# Patient Record
Sex: Female | Born: 1965 | Race: White | Hispanic: No | Smoking: Current every day smoker
Health system: Southern US, Community
[De-identification: ages and names within clinical notes are randomized; demographics above are authoritative.]

## PROBLEM LIST (undated history)

## (undated) DIAGNOSIS — F410 Panic disorder [episodic paroxysmal anxiety] without agoraphobia: Secondary | ICD-10-CM

## (undated) DIAGNOSIS — F509 Eating disorder, unspecified: Secondary | ICD-10-CM

## (undated) DIAGNOSIS — F429 Obsessive-compulsive disorder, unspecified: Secondary | ICD-10-CM

## (undated) DIAGNOSIS — M1990 Unspecified osteoarthritis, unspecified site: Secondary | ICD-10-CM

## (undated) DIAGNOSIS — F329 Major depressive disorder, single episode, unspecified: Secondary | ICD-10-CM

## (undated) DIAGNOSIS — F319 Bipolar disorder, unspecified: Secondary | ICD-10-CM

## (undated) DIAGNOSIS — R87629 Unspecified abnormal cytological findings in specimens from vagina: Secondary | ICD-10-CM

## (undated) DIAGNOSIS — K219 Gastro-esophageal reflux disease without esophagitis: Secondary | ICD-10-CM

## (undated) DIAGNOSIS — M7500 Adhesive capsulitis of unspecified shoulder: Secondary | ICD-10-CM

## (undated) DIAGNOSIS — G709 Myoneural disorder, unspecified: Secondary | ICD-10-CM

## (undated) DIAGNOSIS — D72829 Elevated white blood cell count, unspecified: Secondary | ICD-10-CM

## (undated) DIAGNOSIS — F419 Anxiety disorder, unspecified: Secondary | ICD-10-CM

## (undated) DIAGNOSIS — F32A Depression, unspecified: Secondary | ICD-10-CM

## (undated) DIAGNOSIS — M549 Dorsalgia, unspecified: Secondary | ICD-10-CM

## (undated) DIAGNOSIS — J381 Polyp of vocal cord and larynx: Secondary | ICD-10-CM

## (undated) HISTORY — PX: DIAGNOSTIC LAPAROSCOPY: SUR761

## (undated) HISTORY — DX: Elevated white blood cell count, unspecified: D72.829

## (undated) HISTORY — DX: Eating disorder, unspecified: F50.9

## (undated) HISTORY — PX: OTHER SURGICAL HISTORY: SHX169

## (undated) HISTORY — PX: TONSILLECTOMY: SUR1361

## (undated) HISTORY — DX: Gastro-esophageal reflux disease without esophagitis: K21.9

## (undated) HISTORY — DX: Bipolar disorder, unspecified: F31.9

## (undated) HISTORY — DX: Dorsalgia, unspecified: M54.9

## (undated) HISTORY — DX: Adhesive capsulitis of unspecified shoulder: M75.00

## (undated) HISTORY — DX: Obsessive-compulsive disorder, unspecified: F42.9

## (undated) HISTORY — DX: Polyp of vocal cord and larynx: J38.1

## (undated) HISTORY — DX: Myoneural disorder, unspecified: G70.9

## (undated) HISTORY — DX: Unspecified abnormal cytological findings in specimens from vagina: R87.629

## (undated) HISTORY — DX: Panic disorder (episodic paroxysmal anxiety): F41.0

---

## 2003-07-07 ENCOUNTER — Ambulatory Visit (HOSPITAL_COMMUNITY): Admission: RE | Admit: 2003-07-07 | Discharge: 2003-07-07 | Payer: Self-pay | Admitting: Obstetrics and Gynecology

## 2003-07-17 ENCOUNTER — Inpatient Hospital Stay (HOSPITAL_COMMUNITY): Admission: RE | Admit: 2003-07-17 | Discharge: 2003-07-19 | Payer: Self-pay | Admitting: Obstetrics and Gynecology

## 2011-09-24 ENCOUNTER — Other Ambulatory Visit (HOSPITAL_COMMUNITY)
Admission: RE | Admit: 2011-09-24 | Discharge: 2011-09-24 | Disposition: A | Discharge status: Home / Self Care | Payer: Medicaid Other | Source: Ambulatory Visit | Attending: Obstetrics and Gynecology | Admitting: Obstetrics and Gynecology

## 2011-09-24 DIAGNOSIS — Z01419 Encounter for gynecological examination (general) (routine) without abnormal findings: Secondary | ICD-10-CM | POA: Insufficient documentation

## 2011-10-04 HISTORY — PX: TUBAL LIGATION: SHX77

## 2011-10-20 ENCOUNTER — Encounter (HOSPITAL_COMMUNITY): Payer: Self-pay | Admitting: Pharmacy Technician

## 2011-10-21 ENCOUNTER — Other Ambulatory Visit: Payer: Self-pay | Admitting: Obstetrics and Gynecology

## 2011-10-21 ENCOUNTER — Encounter (HOSPITAL_COMMUNITY)
Admission: RE | Admit: 2011-10-21 | Discharge: 2011-10-21 | Disposition: A | Discharge status: Home / Self Care | Payer: Medicaid Other | Source: Ambulatory Visit | Attending: Obstetrics and Gynecology | Admitting: Obstetrics and Gynecology

## 2011-10-21 ENCOUNTER — Encounter (HOSPITAL_COMMUNITY): Payer: Self-pay

## 2011-10-21 HISTORY — DX: Major depressive disorder, single episode, unspecified: F32.9

## 2011-10-21 HISTORY — DX: Depression, unspecified: F32.A

## 2011-10-21 HISTORY — DX: Anxiety disorder, unspecified: F41.9

## 2011-10-21 HISTORY — DX: Unspecified osteoarthritis, unspecified site: M19.90

## 2011-10-21 LAB — CBC
MCH: 33 pg (ref 26.0–34.0)
MCV: 95.8 fL (ref 78.0–100.0)
Platelets: 266 10*3/uL (ref 150–400)
RDW: 12.1 % (ref 11.5–15.5)

## 2011-10-21 LAB — URINALYSIS, ROUTINE W REFLEX MICROSCOPIC
Bilirubin Urine: NEGATIVE
Hgb urine dipstick: NEGATIVE
Protein, ur: NEGATIVE mg/dL
Specific Gravity, Urine: <1.005 — ABNORMAL LOW (ref 1.005–1.030)
Urobilinogen, UA: 0.2 mg/dL (ref 0.0–1.0)

## 2011-10-21 LAB — URINE MICROSCOPIC-ADD ON

## 2011-10-21 NOTE — Patient Instructions (Addendum)
20 Penny Moreno  10/21/2011   Your procedure is scheduled on:  10/30/11  Report to [facility] at 06:15 AM.  Call this number if you have problems the morning of surgery: [phone]   Remember:   Do not eat food or drink:After Midnight.  Take these medicines the morning of surgery with A SIP OF WATER: Alprazolam and Fluoxetine  if needed. Also, use your Flonase inhaler and bring it with you to the hospital.   Do not wear jewelry, make-up or nail polish.  Do not wear lotions, powders, or perfumes.   Do not shave 48 hours prior to surgery. Men may shave face and neck.  Do not bring valuables to the hospital.  Contacts, dentures or bridgework may not be worn into surgery.  Leave suitcase in the car. After surgery it may be brought to your room.  For patients admitted to the hospital, checkout time is 11:00 AM the day of discharge.   Patients discharged the day of surgery will not be allowed to drive home.  Special Instructions: CHG Shower Shower 2 days before surgery and 1 day before surgery with Hibiclens.   Please read over the following fact sheets that you were given: Pain Booklet, MRSA Information, Surgical Site Infection Prevention, Anesthesia Post-op Instructions and Care and Recovery After Surgery    Laparoscopic Tubal Ligation Laparoscopic tubal ligation is a procedure that closes the fallopian tubes. Tubal ligation is also known as getting your "tubes tied." It is a brief, common and relatively simple surgical procedure. Tubal ligation is done to cause sterilization. Sterilization means you will not be able to get pregnant or have a baby. Sometimes a tubal ligation may be reversed, but this should not be considered a possibility because of failure to get pregnant and the increased risk of tubal (ectopic) pregnancy. If you want to have future pregnancies, you should not have this procedure. Use other forms of contraception. Tubal ligation can be done at any time during your menstrual  cycle. This procedure has a less than 1% failure rate. Tubal ligation does not protect against sexually transmitted disease. LET YOUR CAREGIVER KNOW ABOUT:  Allergies, especially to medicines.   All the medicines you are taking, even herbs, eyedrops, steroids, creams, and over-the-counter drugs.   The possibility of being pregnant.   Past problems with medicines.   History of blood clots or other blood problems.   Past surgeries, medical or health problems.  RISKS AND COMPLICATIONS  Your caregiver will discuss the risks with you before the procedure. Some problems that can happen after this procedure include:  Infection. A germ starts growing in one of the wounds. This can usually be treated with antibiotic medicines.   Bleeding following surgery. Your surgeon takes every precaution to keep this from happening.   Damage to other organs. If damage to other organs or excessive bleeding should occur, it may be necessary to convert the laparoscopic procedure into an open abdominal procedure. Scarring (adhesions) from past surgeries or disease may also be a cause to change this procedure to an open abdominal operation.   Anesthetic side effects.  BEFORE THE PROCEDURE  Do not take aspirin or blood thinners a week before the procedure. This can cause bleeding. Do as directed by your caregiver.   Do not eat or drink anything 6 to 8 hours before the procedure.   Let your caregiver know if you get a cold or an infection before the procedure.   Arrive at the clinic or  hospital 60 minutes before the surgery or as directed.  PROCEDURE   You may be given a mild drug (sedative) to help you relax before the procedure. Once in the operating room, you will be given a general anesthetic to make you sleep (unless you and your caregiver choose a different anesthetic).   Once you are sleeping, your surgeon makes your abdomen larger with a harmless gas (carbon dioxide). This makes your organs easier to  see and gives room to operate.   A laparoscope is then inserted into your abdomen through a small cut (incision) below the navel. A laparoscope is a thin, lighted, pencil-sized instrument. It is like a telescope. Once inserted, your caregiver can look at the organs inside your abdomen. He or she can see if there is anything abnormal.   Other small instruments also are put into the abdomen through other small openings (portals). Many surgeons attach a video camera to the laparoscope to enlarge the view. The fallopian tubes are located and either burned closed (cauterized) or a plastic clip is placed on them to close the tubes.  Sometimes, your surgeon may take tissue samples (biopsies) from other organs if he or she sees something abnormal. Biopsies can help to diagnose or confirm a disease. The samples are examined under a microscope. AFTER THE PROCEDURE   The gas is released from inside your abdomen.   Your incisions are closed with stitches (sutures). Because these incisions are small (usually less than  inch), there is usually little discomfort following the procedure.   You will rest in a recovery room until you are stable and doing well.   As long as there are no problems, you may be allowed to go home.   Someone will need to drive you home.   You will have some mild discomfort in the throat. This is from the tube placed in your throat while you were sleeping.   You may experience discomfort in the shoulder area from some trapped air between the liver and diaphragm. This will slowly go away on its own.   You will also have some mild abdominal discomfort. You will also have discomfort from the incisions where the instruments were placed into your abdomen.  HOME CARE INSTRUCTIONS   Only take over-the-counter or prescription medicines for pain, discomfort, or fever as directed by your caregiver. Do not take aspirin. It can cause bleeding.   Resume daily activities, diet, rest, driving,  and work as directed.   Showers are preferred over baths.   Resume sexual activities in 1 week or as directed.   If biopsies were taken, find out how to get your results. The results are usually given during the follow-up visit. Do not assume tests are normal if you do not hear from your caregiver. It is your responsibility to obtain your results.   Change the dressings as directed.   It is helpful to have someone with you for several hours after you go home. They can help you and be available if you have problems.  SEEK MEDICAL CARE IF:   You have increasing abdominal pain.   You develop new pain in your shoulders in the shoulder strap area that gets worse with time. Some pain is common and expected.   You feel lightheaded or faint.   You have chills or fever.   You develop bleeding or drainage from the suture sites or the vagina after surgery.   You develop chest pain.   You develop shortness of  breath.   You develop a rash.  Document Released: 04/27/2000 Document Revised: 01/08/2011 Document Reviewed: 08/10/2007 [facility] Patient Information 2012 [place], [place].   PATIENT INSTRUCTIONS POST-ANESTHESIA  IMMEDIATELY FOLLOWING SURGERY:  Do not drive or operate machinery for the first twenty four hours after surgery.  Do not make any important decisions for twenty four hours after surgery or while taking narcotic pain medications or sedatives.  If you develop intractable nausea and vomiting or a severe headache please notify your doctor immediately.  FOLLOW-UP:  Please make an appointment with your surgeon as instructed. You do not need to follow up with anesthesia unless specifically instructed to do so.  WOUND CARE INSTRUCTIONS (if applicable):  Keep a dry clean dressing on the anesthesia/puncture wound site if there is drainage.  Once the wound has quit draining you may leave it open to air.  Generally you should leave the bandage intact for twenty four hours unless there is  drainage.  If the epidural site drains for more than 36-48 hours please call the anesthesia department.  QUESTIONS?:  Please feel free to call your physician or the hospital operator if you have any questions, and they will be happy to assist you.

## 2011-10-30 ENCOUNTER — Encounter (HOSPITAL_COMMUNITY): Payer: Self-pay

## 2011-10-30 ENCOUNTER — Ambulatory Visit (HOSPITAL_COMMUNITY)
Admission: RE | Admit: 2011-10-30 | Discharge: 2011-10-30 | Disposition: A | Discharge status: Home / Self Care | Payer: Medicaid Other | Source: Ambulatory Visit | Attending: Obstetrics and Gynecology | Admitting: Obstetrics and Gynecology

## 2011-10-30 ENCOUNTER — Encounter (HOSPITAL_COMMUNITY): Payer: Self-pay | Admitting: Anesthesiology

## 2011-10-30 ENCOUNTER — Ambulatory Visit (HOSPITAL_COMMUNITY): Payer: Medicaid Other | Admitting: Anesthesiology

## 2011-10-30 ENCOUNTER — Encounter (HOSPITAL_COMMUNITY)
Admission: RE | Disposition: A | Discharge status: Home / Self Care | Payer: Self-pay | Source: Ambulatory Visit | Attending: Obstetrics and Gynecology

## 2011-10-30 DIAGNOSIS — Z302 Encounter for sterilization: Secondary | ICD-10-CM

## 2011-10-30 DIAGNOSIS — Z01812 Encounter for preprocedural laboratory examination: Secondary | ICD-10-CM | POA: Insufficient documentation

## 2011-10-30 HISTORY — PX: LAPAROSCOPIC TUBAL LIGATION: SHX1937

## 2011-10-30 SURGERY — LIGATION, FALLOPIAN TUBE, LAPAROSCOPIC
Anesthesia: General | Site: Abdomen | Laterality: Bilateral | Wound class: Clean Contaminated

## 2011-10-30 MED ORDER — FENTANYL CITRATE 0.05 MG/ML IJ SOLN
INTRAMUSCULAR | Status: DC | PRN
  Administered 2011-10-30: 50 ug via INTRAVENOUS
  Administered 2011-10-30: 100 ug via INTRAVENOUS
  Administered 2011-10-30: 50 ug via INTRAVENOUS

## 2011-10-30 MED ORDER — PROPOFOL 10 MG/ML IV BOLUS
INTRAVENOUS | Status: DC | PRN
  Administered 2011-10-30: 150 mg via INTRAVENOUS

## 2011-10-30 MED ORDER — ONDANSETRON HCL 4 MG/2ML IJ SOLN: 4.0000 mg | Freq: Once | INTRAMUSCULAR | Status: DC | PRN

## 2011-10-30 MED ORDER — MIDAZOLAM HCL 2 MG/2ML IJ SOLN
INTRAMUSCULAR | Status: AC
  Filled 2011-10-30: qty 2

## 2011-10-30 MED ORDER — ONDANSETRON HCL 4 MG/2ML IJ SOLN
4.0000 mg | Freq: Once | INTRAMUSCULAR | Status: AC
  Administered 2011-10-30: 4 mg via INTRAVENOUS

## 2011-10-30 MED ORDER — MIDAZOLAM HCL 2 MG/2ML IJ SOLN
1.0000 mg | INTRAMUSCULAR | Status: DC | PRN
  Administered 2011-10-30: 2 mg via INTRAVENOUS

## 2011-10-30 MED ORDER — 0.9 % SODIUM CHLORIDE (POUR BTL) OPTIME
TOPICAL | Status: DC | PRN
  Administered 2011-10-30: 1000 mL

## 2011-10-30 MED ORDER — GLYCOPYRROLATE 0.2 MG/ML IJ SOLN
0.2000 mg | Freq: Once | INTRAMUSCULAR | Status: AC
  Administered 2011-10-30: 0.2 mg via INTRAVENOUS

## 2011-10-30 MED ORDER — GLYCOPYRROLATE 0.2 MG/ML IJ SOLN
INTRAMUSCULAR | Status: DC | PRN
  Administered 2011-10-30: 0.4 mg via INTRAVENOUS

## 2011-10-30 MED ORDER — GLYCOPYRROLATE 0.2 MG/ML IJ SOLN
INTRAMUSCULAR | Status: AC
  Filled 2011-10-30: qty 1

## 2011-10-30 MED ORDER — HYDROCODONE-ACETAMINOPHEN 5-325 MG PO TABS: 1.0000 | ORAL_TABLET | ORAL | Status: DC | PRN

## 2011-10-30 MED ORDER — ROCURONIUM BROMIDE 100 MG/10ML IV SOLN
INTRAVENOUS | Status: DC | PRN
  Administered 2011-10-30: 30 mg via INTRAVENOUS

## 2011-10-30 MED ORDER — PROPOFOL 10 MG/ML IV EMUL
INTRAVENOUS | Status: AC
  Filled 2011-10-30: qty 20

## 2011-10-30 MED ORDER — FENTANYL CITRATE 0.05 MG/ML IJ SOLN
25.0000 ug | INTRAMUSCULAR | Status: DC | PRN
  Administered 2011-10-30 (×2): 50 ug via INTRAVENOUS

## 2011-10-30 MED ORDER — LIDOCAINE HCL (PF) 1 % IJ SOLN
INTRAMUSCULAR | Status: AC
  Filled 2011-10-30: qty 5

## 2011-10-30 MED ORDER — ONDANSETRON HCL 4 MG/2ML IJ SOLN
INTRAMUSCULAR | Status: AC
  Filled 2011-10-30: qty 2

## 2011-10-30 MED ORDER — MUPIROCIN 2 % EX OINT
TOPICAL_OINTMENT | CUTANEOUS | Status: AC
  Filled 2011-10-30: qty 22

## 2011-10-30 MED ORDER — LIDOCAINE HCL 1 % IJ SOLN
INTRAMUSCULAR | Status: DC | PRN
  Administered 2011-10-30: 50 mg via INTRADERMAL

## 2011-10-30 MED ORDER — ROCURONIUM BROMIDE 50 MG/5ML IV SOLN
INTRAVENOUS | Status: AC
  Filled 2011-10-30: qty 1

## 2011-10-30 MED ORDER — LACTATED RINGERS IV SOLN
INTRAVENOUS | Status: DC
  Administered 2011-10-30: 08:00:00 via INTRAVENOUS

## 2011-10-30 MED ORDER — BUPIVACAINE HCL (PF) 0.5 % IJ SOLN
INTRAMUSCULAR | Status: DC | PRN
  Administered 2011-10-30: 17 mL

## 2011-10-30 MED ORDER — FENTANYL CITRATE 0.05 MG/ML IJ SOLN
INTRAMUSCULAR | Status: AC
  Filled 2011-10-30: qty 2

## 2011-10-30 MED ORDER — MIDAZOLAM HCL 5 MG/5ML IJ SOLN
INTRAMUSCULAR | Status: DC | PRN
  Administered 2011-10-30: 2 mg via INTRAVENOUS

## 2011-10-30 MED ORDER — FENTANYL CITRATE 0.05 MG/ML IJ SOLN
INTRAMUSCULAR | Status: AC
  Filled 2011-10-30: qty 5

## 2011-10-30 MED ORDER — BUPIVACAINE HCL (PF) 0.5 % IJ SOLN
INTRAMUSCULAR | Status: AC
  Filled 2011-10-30: qty 30

## 2011-10-30 MED ORDER — NEOSTIGMINE METHYLSULFATE 1 MG/ML IJ SOLN
INTRAMUSCULAR | Status: AC
  Filled 2011-10-30: qty 10

## 2011-10-30 MED ORDER — NEOSTIGMINE METHYLSULFATE 1 MG/ML IJ SOLN
INTRAMUSCULAR | Status: DC | PRN
  Administered 2011-10-30: 3 mg via INTRAVENOUS

## 2011-10-30 SURGICAL SUPPLY — 35 items
BAG HAMPER (MISCELLANEOUS) ×2 IMPLANT
BLADE SURG SZ11 CARB STEEL (BLADE) ×2 IMPLANT
CATH INTERMIT 12FR 16IN MALE (CATHETERS) ×1 IMPLANT
CATH ROBINSON RED A/P 16FR (CATHETERS) ×1 IMPLANT
CLOTH BEACON ORANGE TIMEOUT ST (SAFETY) ×2 IMPLANT
COVER LIGHT HANDLE STERIS (MISCELLANEOUS) ×4 IMPLANT
DECANTER SPIKE VIAL GLASS SM (MISCELLANEOUS) ×2 IMPLANT
DRESSING COVERLET 3X1 FLEXIBLE (GAUZE/BANDAGES/DRESSINGS) ×4 IMPLANT
ELECT REM PT RETURN 9FT ADLT (ELECTROSURGICAL) ×2
ELECTRODE REM PT RTRN 9FT ADLT (ELECTROSURGICAL) ×1 IMPLANT
GLOVE ECLIPSE 9.0 STRL (GLOVE) ×2 IMPLANT
GLOVE EXAM NITRILE MD LF STRL (GLOVE) ×2 IMPLANT
GLOVE INDICATOR STER SZ 9 (GLOVE) ×2 IMPLANT
GOWN STRL REIN 3XL LVL4 (GOWN DISPOSABLE) ×2 IMPLANT
GOWN STRL REIN XL XLG (GOWN DISPOSABLE) ×2 IMPLANT
INST SET LAPROSCOPIC GYN AP (KITS) ×2 IMPLANT
KIT ROOM TURNOVER AP CYSTO (KITS) ×2 IMPLANT
MANIFOLD NEPTUNE II (INSTRUMENTS) ×2 IMPLANT
NDL INSUFFLATION 14GA 120MM (NEEDLE) IMPLANT
NEEDLE INSUFFLATION 14GA 120MM (NEEDLE) ×2 IMPLANT
NS IRRIG 1000ML POUR BTL (IV SOLUTION) ×2 IMPLANT
PACK PERI GYN (CUSTOM PROCEDURE TRAY) ×2 IMPLANT
PAD ARMBOARD 7.5X6 YLW CONV (MISCELLANEOUS) ×2 IMPLANT
RING FALLOPIAN BANDS (Ring) ×2 IMPLANT
SET BASIN LINEN APH (SET/KITS/TRAYS/PACK) ×2 IMPLANT
SOL PREP PROV IODINE SCRUB 4OZ (MISCELLANEOUS) ×2 IMPLANT
SOLUTION ANTI FOG 6CC (MISCELLANEOUS) ×2 IMPLANT
STRIP CLOSURE SKIN 1/4X3 (GAUZE/BANDAGES/DRESSINGS) ×2 IMPLANT
SUT VIC AB 4-0 PS2 27 (SUTURE) IMPLANT
SYR BULB IRRIGATION 50ML (SYRINGE) ×2 IMPLANT
SYR CONTROL 10ML LL (SYRINGE) ×2 IMPLANT
TROCAR KII 8X100ML NONTHREADED (TROCAR) ×2 IMPLANT
TROCAR Z-THREAD FIOS 5X100MM (TROCAR) ×2 IMPLANT
TUBING INSUFFLATION HIGH FLOW (TUBING) ×2 IMPLANT
WARMER LAPAROSCOPE (MISCELLANEOUS) ×2 IMPLANT

## 2011-10-30 NOTE — Anesthesia Procedure Notes (Signed)
Procedure Name: Intubation Date/Time: 10/30/2011 7:56 AM Performed by: Despina Hidden Pre-anesthesia Checklist: Emergency Drugs available, Suction available, Patient identified and Patient being monitored Patient Re-evaluated:Patient Re-evaluated prior to inductionOxygen Delivery Method: Circle system utilized Preoxygenation: Pre-oxygenation with 100% oxygen Intubation Type: IV induction and Cricoid Pressure applied Ventilation: Mask ventilation without difficulty Laryngoscope Size: Mac and 3 Grade View: Grade II Tube type: Oral Tube size: 7.0 mm Number of attempts: 1 Airway Equipment and Method: Stylet Placement Confirmation: ETT inserted through vocal cords under direct vision,  breath sounds checked- equal and bilateral and positive ETCO2 Secured at: 22 cm Tube secured with: Tape Dental Injury: Teeth and Oropharynx as per pre-operative assessment  Difficulty Due To: Difficult Airway- due to anterior larynx Future Recommendations: Recommend- induction with short-acting agent, and alternative techniques readily available

## 2011-10-30 NOTE — Interval H&P Note (Signed)
History and Physical Interval Note:  10/30/2011 7:41 AM  Penny Moreno  has presented today for surgery, with the diagnosis of sterilization request  The various methods of treatment have been discussed with the patient and family. After consideration of risks, benefits and other options for treatment, the patient has consented to  Procedure(s) (LRB) with comments: LAPAROSCOPIC TUBAL LIGATION (Bilateral) - Laparoscopic Bilateral tubal ligation with falope rings as a surgical intervention .  The patient's history has been reviewed, patient examined, no change in status, stable for surgery.  I have reviewed the patient's chart and labs.  Questions were answered to the patient's satisfaction.   Specifically, after discussions held in the office, the patient is comfortable following the pigmented area clinically at office visits, and plans to perform diagnostic biopsy are cancelled.  Penny Moreno

## 2011-10-30 NOTE — Brief Op Note (Signed)
10/30/2011  8:37 AM  PATIENT:  Penny Moreno  46 y.o. female  PRE-OPERATIVE DIAGNOSIS:  sterilization request POST-OPERATIVE DIAGNOSIS:  sterilization request  PROCEDURE:  Procedure(s) (LRB) with comments: LAPAROSCOPIC TUBAL LIGATION (Bilateral) - Laparoscopic Bilateral tubal ligation with falope rings SURGEON:  Surgeon(s) and Role:    * [name], MD - Primary ASSISTANTS: Blackwell, CST   ANESTHESIA:   general, local EBL:  Total I/O In: 700 [I.V.:700] Out: -  DRAINS: none   LOCAL MEDICATIONS USED:  MARCAINE    and Amount: 10 ml  SPECIMEN:  No Specimen DISPOSITION OF SPECIMEN:  N/A COUNTS:  YES  DICTATION: .Dragon Dictation Patient was taken to the operating room prepped and draped after timeout conducted, and procedure confirmed by surgical team. An infraumbilical vertical 1 cm skin incision was made as well as a transverse suprapubic incision of similar length. Water droplet technique was used through Veress needle to achieve pneumoperitoneum. The abdomen was insufflated with 3 L CO2 and the 5 mm laparoscopic trocar introduced through the umbilicus and no evidence of trauma associated with peritoneal entry. Suprapubic trocar was placed under direct visualization and attention directed to the left fallopian tube which was identified elevated and Falope ring applied to the midportion of the tube with Marcaine injection in the incarcerated knuckle of tube and beneath it in the mesosalpinx. The opposite tube was treated similarly, followed by placement of 240 cc of saline in the abdomen to assist with deflation of the abdomen. Removal of CO2 was followed by removal laparoscopic trochars, subcuticular 4-0 Vicryl closure the skin incisions, Steri-Strip placement, and Band-Aids applied. Sponge and needle counts were correct condition to recovery room good.  PLAN OF CARE: Discharge to home after PACU  PATIENT DISPOSITION:  PACU - hemodynamically stable.   Delay start of  Pharmacological VTE agent (>24hrs) due to surgical blood loss or risk of bleeding: n/a

## 2011-10-30 NOTE — Addendum Note (Signed)
Addendum  created 10/30/11 1327 by Despina Hidden, CRNA   Modules edited:Charges VN

## 2011-10-30 NOTE — Op Note (Signed)
See operative details included in brief operative note 

## 2011-10-30 NOTE — Transfer of Care (Signed)
Immediate Anesthesia Transfer of Care Note  Patient: Penny Moreno  Procedure(s) Performed: Procedure(s) (LRB) with comments: LAPAROSCOPIC TUBAL LIGATION (Bilateral) - Laparoscopic Bilateral tubal ligation with falope rings  Patient Location: PACU  Anesthesia Type: General  Level of Consciousness: awake, alert , oriented and patient cooperative  Airway & Oxygen Therapy: Patient Spontanous Breathing and Patient connected to face mask oxygen  Post-op Assessment: Report given to PACU RN, Post -op Vital signs reviewed and stable and Patient moving all extremities  Post vital signs: Reviewed and stable  Complications: No apparent anesthesia complications

## 2011-10-30 NOTE — Anesthesia Postprocedure Evaluation (Signed)
  Anesthesia Post-op Note  Patient: Penny Moreno  Procedure(s) Performed: Procedure(s) (LRB) with comments: LAPAROSCOPIC TUBAL LIGATION (Bilateral) - Laparoscopic Bilateral tubal ligation with falope rings  Patient Location: PACU  Anesthesia Type: General  Level of Consciousness: awake, alert , oriented and patient cooperative  Airway and Oxygen Therapy: Patient Spontanous Breathing  Post-op Pain: none  Post-op Assessment: Post-op Vital signs reviewed, Patient's Cardiovascular Status Stable, Respiratory Function Stable, Patent Airway, No signs of Nausea or vomiting and Pain level controlled  Post-op Vital Signs: Reviewed and stable  Complications: No apparent anesthesia complications

## 2011-10-30 NOTE — Anesthesia Preprocedure Evaluation (Signed)
Anesthesia Evaluation  Patient identified by MRN, date of birth, ID band Patient awake    Reviewed: Allergy & Precautions, H&P , NPO status , Patient's Chart, lab work & pertinent test results  History of Anesthesia Complications Negative for: history of anesthetic complications  Airway Mallampati: II TM Distance: >3 FB     Dental  (+) Teeth Intact and Poor Dentition   Pulmonary Current Smoker, PE: am cough. + rhonchi (clears w/ cough)         Cardiovascular negative cardio ROS  Rhythm:Regular Rate:Normal     Neuro/Psych PSYCHIATRIC DISORDERS Anxiety Depression    GI/Hepatic   Endo/Other    Renal/GU      Musculoskeletal   Abdominal   Peds  Hematology   Anesthesia Other Findings   Reproductive/Obstetrics                           Anesthesia Physical Anesthesia Plan  ASA: II  Anesthesia Plan: General   Post-op Pain Management:    Induction: Intravenous  Airway Management Planned: Oral ETT  Additional Equipment:   Intra-op Plan:   Post-operative Plan: Extubation in OR  Informed Consent: I have reviewed the patients History and Physical, chart, labs and discussed the procedure including the risks, benefits and alternatives for the proposed anesthesia with the patient or authorized representative who has indicated his/her understanding and acceptance.     Plan Discussed with:   Anesthesia Plan Comments:         Anesthesia Quick Evaluation

## 2011-10-30 NOTE — H&P (Signed)
Penny Moreno is an 46 y.o. female. She desires permanent sterilization and has a laparoscopic tubal sterilization by Falope-Rings explained. Additionally she has some small pigmented areas on the perineal posterior fourchette that cause her concern. Diagnostic and excisional biopsy will be added to procedure.. risk of tubal sterilization including a 1 in 100 failure rate have included patient  Pertinent Gynecological History: Menses: flow is moderate Bleeding: Regular until June slight irregularity since then Contraception: abstinence DES exposure: unknown Blood transfusions: none Sexually transmitted diseases: no past history Previous GYN Procedures: None  Last mammogram: normal Date:  Last pap: normal Date: 09/25/2011 OB History: G5, P2   Menstrual History: Menarche age: [age] Patient's last menstrual period was 09/03/2011.    Past Medical History  Diagnosis Date  . Anxiety   . Depression   . Arthritis     Past Surgical History  Procedure Date  . Diagnostic laparoscopy     [place]  . Tonsillectomy     History reviewed. No pertinent family history.  Social History:  reports that she has been smoking Cigarettes.  She has a 25 pack-year smoking history. She does not have any smokeless tobacco history on file. She reports that she drinks alcohol. She reports that she uses illicit drugs (Marijuana).  Allergies:  Allergies  Allergen Reactions  . Latex     Irritates skin   . Sulfa Antibiotics Nausea And Vomiting    Prescriptions prior to admission  Medication Sig Dispense Refill  . ALPRAZolam (XANAX) 0.5 MG tablet Take 0.5 mg by mouth daily as needed. For anxiety      . cetirizine (ZYRTEC) 10 MG tablet Take 10 mg by mouth daily.      . cyclobenzaprine (FLEXERIL) 10 MG tablet Take 10 mg by mouth daily as needed. For muscle pain      . FLUoxetine (PROZAC) 20 MG capsule Take 20 mg by mouth daily.      . fluticasone (FLONASE) 50 MCG/ACT nasal spray Place 1-2 sprays  into the nose daily.      . meloxicam (MOBIC) 15 MG tablet Take 15 mg by mouth daily as needed. For inflammation      . Probiotic Product (PROBIOTIC PEARLS PO) Take 1 capsule by mouth daily.        Review of Systems  Constitutional: Negative.   Genitourinary:       Patient has small pigmented area on the posterior fourchette for which she desires diagnostic biopsy to be performed at the time of tubal ligation  Musculoskeletal: Negative.     Blood pressure 123/76, pulse 78, temperature 97.6 F (36.4 C), temperature source Oral, resp. rate 16, last menstrual period 09/03/2011, SpO2 97.00%. Physical Exam  Constitutional: She is oriented to person, place, and time. She appears well-developed and well-nourished.       Vital signs weight 136 blood pressure 120/80 pulse 70s  HENT:  Head: Normocephalic and atraumatic.  Eyes: Pupils are equal, round, and reactive to light.  Neck: Neck supple.  Cardiovascular: Normal rate and regular rhythm.   Respiratory: Effort normal and breath sounds normal.  GI: Soft.  Genitourinary: Vagina normal and uterus normal.       Normal external genitalia other than a small variable pigmentation area of the posterior fourchette for which the patient has concerns. This does not appear particularly suspicious but will reassure by diagnostic and excisional biopsy  Musculoskeletal: Normal range of motion.  Neurological: She is alert and oriented to person, place, and time.  Psychiatric: She has  a normal mood and affect. Her behavior is normal. Judgment and thought content normal.    No results found for this or any previous visit (from the past 24 hour(s)).  No results found.  Assessment/Plan: 1. Desire for elective permanent sterilization 2. Pigmented vulvar lesion posterior fourchette for excisional biopsy  Chamberlain Steinborn V 10/30/2011, 7:29 AM

## 2011-11-03 ENCOUNTER — Encounter (HOSPITAL_COMMUNITY): Payer: Self-pay | Admitting: Obstetrics and Gynecology

## 2012-07-26 ENCOUNTER — Encounter: Payer: Self-pay | Admitting: Family Medicine

## 2012-07-26 ENCOUNTER — Ambulatory Visit: Payer: Self-pay | Admitting: Family Medicine

## 2012-07-26 ENCOUNTER — Ambulatory Visit (INDEPENDENT_AMBULATORY_CARE_PROVIDER_SITE_OTHER): Payer: Medicaid Other | Admitting: Family Medicine

## 2012-07-26 VITALS — BP 115/73 | HR 86 | Temp 97.9°F | Ht 63.0 in | Wt 136.0 lb

## 2012-07-26 DIAGNOSIS — W57XXXA Bitten or stung by nonvenomous insect and other nonvenomous arthropods, initial encounter: Secondary | ICD-10-CM

## 2012-07-26 DIAGNOSIS — F329 Major depressive disorder, single episode, unspecified: Secondary | ICD-10-CM

## 2012-07-26 DIAGNOSIS — Z23 Encounter for immunization: Secondary | ICD-10-CM

## 2012-07-26 DIAGNOSIS — T148XXA Other injury of unspecified body region, initial encounter: Secondary | ICD-10-CM

## 2012-07-26 DIAGNOSIS — T148 Other injury of unspecified body region: Secondary | ICD-10-CM

## 2012-07-26 MED ORDER — ALPRAZOLAM 0.5 MG PO TABS: 0.5000 mg | ORAL_TABLET | Freq: Every day | ORAL | Status: DC | PRN

## 2012-07-26 NOTE — Progress Notes (Signed)
  Subjective:    Patient ID: Penny Moreno, female    DOB: [birth year], 47 y.o.   MRN: [id number]  HPI This 47 y.o. female presents for evaluation of anxiety and depression. Patient states that she is experiencing some severe anxiety problems.  She has hx of depression and she states she is having difficulty controlling her anger.  She states she feels like she wants to kill or die sometimes but knows that it is wrong but she has these feelings on occasion and feels like she is more depressed.  She is requesting referral to psychiatry at present.  She denies any homicidal or suicidal ideations at present.  She was bitten by a mosquito and is requesting a tetanus shot.   Review of Systems    No chest pain, SOB, HA, dizziness, vision change, N/V, diarrhea, constipation, dysuria, urinary urgency or frequency, myalgias, arthralgias or rash.  Objective:   Physical Exam Vital signs noted  Well developed well nourished female.  HEENT - Head atraumatic Normocephalic                Eyes - PERRLA, Conjuctiva - clear Sclera- Clear EOMI                Ears - EAC's Wnl TM's Wnl Gross Hearing WNL                Nose - Nares patent                 Throat - oropharanx wnl Respiratory - Lungs CTA bilateral Cardiac - RRR S1 and S2 without murmur GI - Abdomen soft Nontender and bowel sounds active x 4 Extremities - No edema. Neuro - Grossly intact.       Assessment & Plan:  Depression - Plan: ALPRAZolam (XANAX) 0.5 MG tablet, Ambulatory referral to Psychology.  Follow up in 2 weeks.  Bite by animal - Plan: Tdap vaccine greater than or equal to 7yo IM

## 2012-07-27 DIAGNOSIS — F329 Major depressive disorder, single episode, unspecified: Secondary | ICD-10-CM | POA: Insufficient documentation

## 2012-07-27 NOTE — Patient Instructions (Signed)

## 2012-08-09 ENCOUNTER — Encounter: Payer: Self-pay | Admitting: Family Medicine

## 2012-08-09 ENCOUNTER — Ambulatory Visit (INDEPENDENT_AMBULATORY_CARE_PROVIDER_SITE_OTHER): Payer: Medicaid Other | Admitting: Family Medicine

## 2012-08-09 VITALS — BP 112/68 | HR 61 | Temp 97.4°F | Wt 137.4 lb

## 2012-08-09 DIAGNOSIS — F329 Major depressive disorder, single episode, unspecified: Secondary | ICD-10-CM

## 2012-08-09 DIAGNOSIS — W57XXXA Bitten or stung by nonvenomous insect and other nonvenomous arthropods, initial encounter: Secondary | ICD-10-CM

## 2012-08-09 DIAGNOSIS — T148 Other injury of unspecified body region: Secondary | ICD-10-CM

## 2012-08-09 MED ORDER — DOXYCYCLINE HYCLATE 100 MG PO TABS: 100.0000 mg | ORAL_TABLET | Freq: Two times a day (BID) | ORAL | Status: DC

## 2012-08-09 NOTE — Patient Instructions (Addendum)
Deer Tick Bite Deer ticks are brown arachnids (spider family) that vary in size from as small as the head of a pin to 1/4 inch (1/2 cm) diameter. They thrive in wooded areas. Deer are the preferred host of adult deer ticks. Small rodents are the host of young ticks (nymphs). When a person walks in a field or wooded area, young and adult ticks in the surrounding grass and vegetation can attach themselves to the skin. They can suck blood for hours to days if unnoticed. Ticks are found all over the [place] Some ticks carry a specific bacteria (Borrelia burgdorferi) that causes an infection called Lyme disease. The bacteria is typically passed into a person during the blood sucking process. This happens after the tick has been attached for at least a number of hours. While ticks can be found all over the [place] those carrying the bacteria that causes Lyme disease are most common in [place] and the [place]. Only a small proportion of ticks in these areas carry the Lyme disease bacteria and cause human infections. Ticks usually attach to warm spots on the body, such as the:  Head.  Back.  Neck.  Armpits.  Groin. SYMPTOMS  Most of the time, a deer tick bite will not be felt. You may or may not see the attached tick. You may notice mild irritation or redness around the bite site. If the deer tick passes the Lyme disease bacteria to a person, a round, red rash may be noticed 2 to 3 days after the bite. The rash may be clear in the middle, like a bull's-eye or target. If not treated, other symptoms may develop several days to weeks after the onset of the rash. These symptoms may include:  New rash lesions.  Fatigue and weakness.  General ill feeling and achiness.  Chills.  Headache and neck pain.  Swollen lymph glands.  Sore muscles and joints. 5 to 15% of untreated people with Lyme disease may develop more severe illnesses after several weeks to months. This may include inflammation of the  brain lining (meningitis), nerve palsies, an abnormal heartbeat, or severe muscle and joint pain and inflammation (myositis or arthritis). DIAGNOSIS   Physical exam and medical history.  Viewing the tick if it was saved for confirmation.  Blood tests (to check or confirm the presence of Lyme disease). TREATMENT  Most ticks do not carry disease. If found, an attached tick should be removed using tweezers. Tweezers should be placed under the body of the tick so it is removed by its attachment parts (pincers). If there are signs or symptoms of being sick, or Lyme disease is confirmed, medicines (antibiotics) that kill germs are usually prescribed. In more severe cases, antibiotics may be given through an intravenous (IV) access. HOME CARE INSTRUCTIONS   Always remove ticks with tweezers. Do not use petroleum jelly or other methods to kill or remove the tick. Slide the tweezers under the body and pull out as much as you can. If you are not sure what it is, save it in a jar and show your caregiver.  Once you remove the tick, the skin will heal on its own. Wash your hands and the affected area with water and soap. You may place a bandage on the affected area.  Take medicine as directed. You may be advised to take a full course of antibiotics.  Follow up with your caregiver as recommended. FINDING OUT THE RESULTS OF YOUR TEST Not all test results are available   during your visit. If your test results are not back during the visit, make an appointment with your caregiver to find out the results. Do not assume everything is normal if you have not heard from your caregiver or the medical facility. It is important for you to follow up on all of your test results. PROGNOSIS  If Lyme disease is confirmed, early treatment with antibiotics is very effective. Following preventive guidelines is important since it is possible to get the disease more than once. PREVENTION   Wear long sleeves and long pants in  wooded or grassy areas. Tuck your pants into your socks.  Use an insect repellent while hiking.  Check yourself, your children, and your pets regularly for ticks after playing outside.  Clear piles of leaves or brush from your yard. Ticks might live there. SEEK MEDICAL CARE IF:   You or your child has an oral temperature above 102 F (38.9 C).  You develop a severe headache following the bite.  You feel generally ill.  You notice a rash.  You are having trouble removing the tick.  The bite area has red skin or yellow drainage. SEEK IMMEDIATE MEDICAL CARE IF:   Your face is weak and droopy or you have other neurological symptoms.  You have severe joint pain or weakness. MAKE SURE YOU:   Understand these instructions.  Will watch your condition.  Will get help right away if you are not doing well or get worse. FOR MORE INFORMATION Centers for Disease Control and Prevention: www.cdc.gov American Academy [employer]icians: www.aafp.org Document Released: 04/15/2009 Document Revised: 04/13/2011 Document Reviewed: 04/15/2009 [employer] Patient Information 2014 [employer].  

## 2012-08-09 NOTE — Progress Notes (Signed)
  Subjective:    Patient ID: Penny Moreno, female    DOB: 04/03/1965, 47 y.o.   MRN: [id number]  HPI  This 47 y.o. female presents for evaluation of follow up on her anxiety and depression.  She has been rx'd xanax, prozac, and referred to psychiatry.  She is due for CPE. She will schedule appointment for this.  She has been doing a lot better since taking the prozac and xanax.  She has recently been bitten by a tick.  Review of Systems    No chest pain, SOB, HA, dizziness, vision change, N/V, diarrhea, constipation, dysuria, urinary urgency or frequency, myalgias, arthralgias or rash.  Objective:   Physical Exam Vital signs noted  Well developed well nourished female.  HEENT - Head atraumatic Normocephalic                Eyes - PERRLA, Conjuctiva - clear Sclera- Clear EOMI                Ears - EAC's Wnl TM's Wnl Gross Hearing WNL                Nose - Nares patent                 Throat - oropharanx wnl Respiratory - Lungs CTA bilateral Cardiac - RRR S1 and S2 without murmur GI - Abdomen soft Nontender and bowel sounds active x 4 Skin - erythema on left groin area where she was bitten by a tick. Extremities - No edema. Neuro - Grossly intact.      Assessment & Plan:  Tick bite - Plan: doxycycline (VIBRA-TABS) 100 MG tablet  Depression Continue current and follow up with psychiatry.  Follow up in next 2 weeks for CPE and CPE labs.

## 2012-08-23 ENCOUNTER — Encounter: Payer: Self-pay | Admitting: Family Medicine

## 2012-08-23 ENCOUNTER — Ambulatory Visit (INDEPENDENT_AMBULATORY_CARE_PROVIDER_SITE_OTHER): Payer: Medicaid Other | Admitting: Family Medicine

## 2012-08-23 VITALS — BP 103/67 | HR 75 | Temp 97.5°F | Ht 64.0 in | Wt 139.0 lb

## 2012-08-23 DIAGNOSIS — J302 Other seasonal allergic rhinitis: Secondary | ICD-10-CM

## 2012-08-23 DIAGNOSIS — F32A Depression, unspecified: Secondary | ICD-10-CM

## 2012-08-23 DIAGNOSIS — F329 Major depressive disorder, single episode, unspecified: Secondary | ICD-10-CM

## 2012-08-23 DIAGNOSIS — J309 Allergic rhinitis, unspecified: Secondary | ICD-10-CM

## 2012-08-23 DIAGNOSIS — L600 Ingrowing nail: Secondary | ICD-10-CM

## 2012-08-23 DIAGNOSIS — Z0000 Encounter for general adult medical examination without abnormal findings: Secondary | ICD-10-CM

## 2012-08-23 LAB — POCT CBC
Granulocyte percent: 68.6 %G (ref 37–80)
HCT, POC: 43.4 % (ref 37.7–47.9)
Hemoglobin: 14.7 g/dL (ref 12.2–16.2)
Lymph, poc: 2.8 (ref 0.6–3.4)
MCH, POC: 32.2 pg — AB (ref 27–31.2)
MCHC: 33.9 g/dL (ref 31.8–35.4)
MCV: 95.1 fL (ref 80–97)
MPV: 6.5 fL (ref 0–99.8)
POC Granulocyte: 7.1 — AB (ref 2–6.9)
POC LYMPH PERCENT: 26.7 %L (ref 10–50)
Platelet Count, POC: 329 10*3/uL (ref 142–424)
RBC: 4.6 M/uL (ref 4.04–5.48)
RDW, POC: 12.9 %
WBC: 10.4 10*3/uL — AB (ref 4.6–10.2)

## 2012-08-23 MED ORDER — FLUTICASONE PROPIONATE 50 MCG/ACT NA SUSP: 1.0000 | Freq: Every day | NASAL | Status: DC

## 2012-08-23 MED ORDER — MONTELUKAST SODIUM 10 MG PO TABS: 10.0000 mg | ORAL_TABLET | Freq: Every day | ORAL | Status: DC

## 2012-08-23 MED ORDER — ALPRAZOLAM 0.5 MG PO TABS: 0.5000 mg | ORAL_TABLET | Freq: Every day | ORAL | Status: DC | PRN

## 2012-08-23 NOTE — Progress Notes (Signed)
  Subjective:    Patient ID: Penny Moreno, female    DOB: [birth year], 47 y.o.   MRN: [id number]  HPI This 47 y.o. female presents for evaluation of CPE.  She is wanting to get a CPE W/o pap or breast exam.  She does SBE's monthly.  She has hx of depression. She has an ingrown toenail on her right foot.  She has not had a mammogram.  She Has had a pap smear in 9/13.  She has not had labs in over a year.  She is seeing a  Psychiatrist office in the next few weeks.  She needs refills on her xanax.   Review of Systems No chest pain, SOB, HA, dizziness, vision change, N/V, diarrhea, constipation, dysuria, urinary urgency or frequency, myalgias, arthralgias or rash.     Objective:   Physical Exam Vital signs noted  Well developed well nourished female.  HEENT - Head atraumatic Normocephalic                Eyes - PERRLA, Conjuctiva - clear Sclera- Clear EOMI                Ears - EAC's Wnl TM's Wnl Gross Hearing WNL                Nose - Nares patent                 Throat - oropharanx wnl Respiratory - Lungs CTA bilateral Cardiac - RRR S1 and S2 without murmur GI - Abdomen soft Nontender and bowel sounds active x 4 Extremities - No edema. Neuro - Grossly intact.       Assessment & Plan:  Depression - Plan: ALPRAZolam (XANAX) 0.5 MG tablet, follow up with psychiatry.  Physical exam - Plan: TSH, Vitamin D, 25-hydroxy, COMPLETE METABOLIC PANEL WITH GFR, NMR Lipoprofile with Lipids, POCT CBC, CANCELED: CBC.  Mammogram ordered with [employer].  Follow up with OBGYN for female exam annually. Continue SBE's monthly  Seasonal allergies - Plan: fluticasone (FLONASE) 50 MCG/ACT nasal spray, montelukast (SINGULAIR) 10 MG tablet  Ingrown toenail - Plan: Ambulatory referral to Podiatry.  Follow up in 3 months.

## 2012-08-23 NOTE — Patient Instructions (Addendum)

## 2012-08-24 LAB — TSH: TSH: 0.898 u[IU]/mL (ref 0.350–4.500)

## 2012-08-24 LAB — COMPLETE METABOLIC PANEL WITH GFR
ALT: 9 U/L (ref 0–35)
AST: 14 U/L (ref 0–37)
Albumin: 4.1 g/dL (ref 3.5–5.2)
Alkaline Phosphatase: 105 U/L (ref 39–117)
BUN: 8 mg/dL (ref 6–23)
CO2: 25 mEq/L (ref 19–32)
Calcium: 8.8 mg/dL (ref 8.4–10.5)
Chloride: 107 mEq/L (ref 96–112)
Creat: 0.65 mg/dL (ref 0.50–1.10)
GFR, Est African American: >89 mL/min
GFR, Est Non African American: >89 mL/min
Glucose, Bld: 92 mg/dL (ref 70–99)
Potassium: 4.4 mEq/L (ref 3.5–5.3)
Sodium: 139 mEq/L (ref 135–145)
Total Bilirubin: 0.2 mg/dL — ABNORMAL LOW (ref 0.3–1.2)
Total Protein: 6.6 g/dL (ref 6.0–8.3)

## 2012-08-24 LAB — NMR LIPOPROFILE WITH LIPIDS
Cholesterol, Total: 248 mg/dL — ABNORMAL HIGH (ref <200)
HDL Particle Number: 26.5 umol/L — ABNORMAL LOW (ref >=30.5)
HDL Size: 8.8 nm — ABNORMAL LOW (ref >=9.2)
HDL-C: 33 mg/dL — ABNORMAL LOW (ref >=40)
LDL (calc): 164 mg/dL — ABNORMAL HIGH (ref <100)
LDL Particle Number: 2873 nmol/L — ABNORMAL HIGH (ref <1000)
LDL Size: 20.1 nm — ABNORMAL LOW (ref >20.5)
LP-IR Score: 47 — ABNORMAL HIGH (ref <=45)
Large HDL-P: 3.9 umol/L — ABNORMAL LOW (ref >=4.8)
Large VLDL-P: <0.8 nmol/L (ref <=2.7)
Small LDL Particle Number: 1965 nmol/L — ABNORMAL HIGH (ref <=527)
Triglycerides: 253 mg/dL — ABNORMAL HIGH (ref <150)
VLDL Size: 51.1 nm — ABNORMAL HIGH (ref <=46.6)

## 2012-08-24 LAB — VITAMIN D 25 HYDROXY (VIT D DEFICIENCY, FRACTURES): Vit D, 25-Hydroxy: 26 ng/mL — ABNORMAL LOW (ref 30–89)

## 2012-08-26 ENCOUNTER — Other Ambulatory Visit: Payer: Self-pay | Admitting: Family Medicine

## 2012-08-26 DIAGNOSIS — E785 Hyperlipidemia, unspecified: Secondary | ICD-10-CM

## 2012-08-26 DIAGNOSIS — E559 Vitamin D deficiency, unspecified: Secondary | ICD-10-CM

## 2012-08-26 MED ORDER — PRAVASTATIN SODIUM 20 MG PO TABS: 20.0000 mg | ORAL_TABLET | Freq: Every day | ORAL | Status: DC

## 2012-08-26 MED ORDER — VITAMIN D3 1.25 MG (50000 UT) PO CAPS: 500000.0000 [IU] | ORAL_CAPSULE | ORAL | Status: DC

## 2012-08-30 NOTE — Progress Notes (Signed)
7/29 Ph# [email or web address]

## 2012-09-02 ENCOUNTER — Ambulatory Visit: Payer: Medicaid Other | Admitting: Podiatry

## 2012-09-22 ENCOUNTER — Ambulatory Visit (INDEPENDENT_AMBULATORY_CARE_PROVIDER_SITE_OTHER): Payer: Medicaid Other | Admitting: Family Medicine

## 2012-09-22 ENCOUNTER — Encounter: Payer: Self-pay | Admitting: Family Medicine

## 2012-09-22 VITALS — BP 103/65 | HR 91 | Temp 98.8°F | Ht 64.0 in | Wt 137.6 lb

## 2012-09-22 DIAGNOSIS — R079 Chest pain, unspecified: Secondary | ICD-10-CM

## 2012-09-22 DIAGNOSIS — M549 Dorsalgia, unspecified: Secondary | ICD-10-CM

## 2012-09-22 DIAGNOSIS — E785 Hyperlipidemia, unspecified: Secondary | ICD-10-CM

## 2012-09-22 MED ORDER — CYCLOBENZAPRINE HCL 10 MG PO TABS: 10.0000 mg | ORAL_TABLET | Freq: Every day | ORAL | Status: DC | PRN

## 2012-09-22 MED ORDER — CYCLOBENZAPRINE HCL 10 MG PO TABS: 10.0000 mg | ORAL_TABLET | Freq: Three times a day (TID) | ORAL | Status: DC | PRN

## 2012-09-22 NOTE — Progress Notes (Signed)
  Subjective:    Patient ID: Penny Moreno, female    DOB: 21-Aug-1965, 47 y.o.   MRN: [id number]  HPI This 47 y.o. female presents for evaluation of chest pain and back pain.  She states she has episodes of severe Chest pain radiating across her chest and going down her arms.  She has hx of hyperlipidemia, tobacco abuse Anxiety, bipolar disorder, myofascial muscle pain, and hx of back.   Review of Systems No  SOB, HA, dizziness, vision change, N/V, diarrhea, constipation, dysuria, urinary urgency or frequency or rash.     Objective:   Physical Exam Vital signs noted  Well developed well nourished female.  HEENT - Head atraumatic Normocephalic                Eyes - PERRLA, Conjuctiva - clear Sclera- Clear EOMI                Ears - EAC's Wnl TM's Wnl Gross Hearing WNL                Nose - Nares patent                 Throat - oropharanx wnl Respiratory - Lungs CTA bilateral Cardiac - RRR S1 and S2 without murmur GI - Abdomen soft Nontender and bowel sounds active x 4 MS - Myofascial pain and TTP bilateral Lumbar spine with muscle spasms, Anterior chest with tenderness to palpation. Extremities - No edema. Neuro - Grossly intact.   EKG - NSR without acute ST-T changes.    Assessment & Plan:  Chest pain - Plan: EKG 12-Lead, cyclobenzaprine (FLEXERIL) 10 MG tablet, Ambulatory referral to Cardiology, cyclobenzaprine (FLEXERIL) 10 MG tablet.  She is having chostochondritis type disccomfort but she c/o chest discomfort that radiates down her arms and across her chest and want to rule out any cardiac etiology.  She has anxiety and psychiatric illness and this contributes.  Back pain - Plan: cyclobenzaprine (FLEXERIL) 10 MG tablet, cyclobenzaprine (FLEXERIL) 10 MG tablet po qhs  Other and unspecified hyperlipidemia - Plan: Lipid panel, CMP14+EGFR  Anxiety - She is seeing psychiatry and states she was told she has bipolar disorder.  Tobacco abuse - Discussed with patient that  she needs to quit and this may be causing her to have chest pain as well.

## 2012-09-22 NOTE — Patient Instructions (Signed)
Chest Pain (Nonspecific) °It is often hard to give a specific diagnosis for the cause of chest pain. There is always a chance that your pain could be related to something serious, such as a heart attack or a blood clot in the lungs. You need to follow up with your caregiver for further evaluation. °CAUSES  °· Heartburn. °· Pneumonia or bronchitis. °· Anxiety or stress. °· Inflammation around your heart (pericarditis) or lung (pleuritis or pleurisy). °· A blood clot in the lung. °· A collapsed lung (pneumothorax). It can develop suddenly on its own (spontaneous pneumothorax) or from injury (trauma) to the chest. °· Shingles infection (herpes zoster virus). °The chest wall is composed of bones, muscles, and cartilage. Any of these can be the source of the pain. °· The bones can be bruised by injury. °· The muscles or cartilage can be strained by coughing or overwork. °· The cartilage can be affected by inflammation and become sore (costochondritis). °DIAGNOSIS  °Lab tests or other studies, such as X-rays, electrocardiography, stress testing, or cardiac imaging, may be needed to find the cause of your pain.  °TREATMENT  °· Treatment depends on what may be causing your chest pain. Treatment may include: °· Acid blockers for heartburn. °· Anti-inflammatory medicine. °· Pain medicine for inflammatory conditions. °· Antibiotics if an infection is present. °· You may be advised to change lifestyle habits. This includes stopping smoking and avoiding alcohol, caffeine, and chocolate. °· You may be advised to keep your head raised (elevated) when sleeping. This reduces the chance of acid going backward from your stomach into your esophagus. °· Most of the time, nonspecific chest pain will improve within 2 to 3 days with rest and mild pain medicine. °HOME CARE INSTRUCTIONS  °· If antibiotics were prescribed, take your antibiotics as directed. Finish them even if you start to feel better. °· For the next few days, avoid physical  activities that bring on chest pain. Continue physical activities as directed. °· Do not smoke. °· Avoid drinking alcohol. °· Only take over-the-counter or prescription medicine for pain, discomfort, or fever as directed by your caregiver. °· Follow your caregiver's suggestions for further testing if your chest pain does not go away. °· Keep any follow-up appointments you made. If you do not go to an appointment, you could develop lasting (chronic) problems with pain. If there is any problem keeping an appointment, you must call to reschedule. °SEEK MEDICAL CARE IF:  °· You think you are having problems from the medicine you are taking. Read your medicine instructions carefully. °· Your chest pain does not go away, even after treatment. °· You develop a rash with blisters on your chest. °SEEK IMMEDIATE MEDICAL CARE IF:  °· You have increased chest pain or pain that spreads to your arm, neck, jaw, back, or abdomen. °· You develop shortness of breath, an increasing cough, or you are coughing up blood. °· You have severe back or abdominal pain, feel nauseous, or vomit. °· You develop severe weakness, fainting, or chills. °· You have a fever. °THIS IS AN EMERGENCY. Do not wait to see if the pain will go away. Get medical help at once. Call your local emergency services (911 in [place] Do not drive yourself to the hospital. °MAKE SURE YOU:  °· Understand these instructions. °· Will watch your condition. °· Will get help right away if you are not doing well or get worse. °Document Released: 10/29/2004 Document Revised: 04/13/2011 Document Reviewed: 08/25/2007 °[employer]® Patient Information ©2014 [employer]   [employer]. ° °

## 2012-09-23 LAB — CMP14+EGFR
ALT: 12 IU/L (ref 0–32)
AST: 15 IU/L (ref 0–40)
Albumin/Globulin Ratio: 2.2 (ref 1.1–2.5)
Albumin: 4.7 g/dL (ref 3.5–5.5)
Alkaline Phosphatase: 120 IU/L — ABNORMAL HIGH (ref 39–117)
BUN/Creatinine Ratio: 13 (ref 9–23)
BUN: 8 mg/dL (ref 6–24)
CO2: 25 mmol/L (ref 18–29)
Calcium: 9 mg/dL (ref 8.7–10.2)
Chloride: 102 mmol/L (ref 97–108)
Creatinine, Ser: 0.61 mg/dL (ref 0.57–1.00)
GFR calc Af Amer: 127 mL/min/{1.73_m2} (ref >59)
GFR calc non Af Amer: 110 mL/min/{1.73_m2} (ref >59)
Globulin, Total: 2.1 g/dL (ref 1.5–4.5)
Glucose: 88 mg/dL (ref 65–99)
Potassium: 4.5 mmol/L (ref 3.5–5.2)
Sodium: 141 mmol/L (ref 134–144)
Total Bilirubin: 0.1 mg/dL (ref 0.0–1.2)
Total Protein: 6.8 g/dL (ref 6.0–8.5)

## 2012-09-23 LAB — LIPID PANEL
Chol/HDL Ratio: 6.4 ratio units — ABNORMAL HIGH (ref 0.0–4.4)
Cholesterol, Total: 250 mg/dL — ABNORMAL HIGH (ref 100–199)
HDL: 39 mg/dL — ABNORMAL LOW (ref >39)
LDL Calculated: 159 mg/dL — ABNORMAL HIGH (ref 0–99)
Triglycerides: 259 mg/dL — ABNORMAL HIGH (ref 0–149)
VLDL Cholesterol Cal: 52 mg/dL — ABNORMAL HIGH (ref 5–40)

## 2012-09-24 ENCOUNTER — Other Ambulatory Visit: Payer: Self-pay | Admitting: Family Medicine

## 2012-09-24 DIAGNOSIS — E785 Hyperlipidemia, unspecified: Secondary | ICD-10-CM

## 2012-09-24 MED ORDER — PRAVASTATIN SODIUM 40 MG PO TABS: 40.0000 mg | ORAL_TABLET | Freq: Every day | ORAL | Status: DC

## 2012-09-26 ENCOUNTER — Other Ambulatory Visit: Payer: Self-pay | Admitting: Nurse Practitioner

## 2012-09-27 ENCOUNTER — Other Ambulatory Visit: Payer: Self-pay | Admitting: Family Medicine

## 2012-09-27 DIAGNOSIS — E785 Hyperlipidemia, unspecified: Secondary | ICD-10-CM

## 2012-09-27 MED ORDER — COLESEVELAM HCL 625 MG PO TABS: 1875.0000 mg | ORAL_TABLET | Freq: Two times a day (BID) | ORAL | Status: DC

## 2012-09-27 NOTE — Progress Notes (Signed)
I sent in welchol rx for patient and this is not a statin cholesterol med like the pravastatin

## 2012-09-28 ENCOUNTER — Telehealth: Payer: Self-pay | Admitting: Family Medicine

## 2012-10-04 ENCOUNTER — Telehealth: Payer: Self-pay | Admitting: *Deleted

## 2012-10-04 NOTE — Telephone Encounter (Signed)
Patient stopped the med and called her phyc and they upped her prozac but she needs to get on a cholesterol med

## 2012-10-04 NOTE — Telephone Encounter (Signed)
Medicaid will not cover welchol until at least two of these meds have been tried and failed.  1-cholestyramine  2-cholestyramine light  3-colestid  4-colestipol tablet   5- Questran or questran light.  Can you take care of this for me ?  Thanks in advance.

## 2012-10-04 NOTE — Telephone Encounter (Signed)
DC cholesterol medicine and discuss at next visit.

## 2012-10-06 NOTE — Telephone Encounter (Signed)
Please DC cholesterol medicine and will discuss at follow up visit

## 2012-10-07 ENCOUNTER — Other Ambulatory Visit: Payer: Self-pay | Admitting: Family Medicine

## 2012-10-07 DIAGNOSIS — E785 Hyperlipidemia, unspecified: Secondary | ICD-10-CM

## 2012-10-07 MED ORDER — COLESTIPOL HCL 1 G PO TABS: 1.0000 g | ORAL_TABLET | Freq: Two times a day (BID) | ORAL | Status: DC

## 2012-10-07 NOTE — Telephone Encounter (Signed)
Colestipol sent  To pharm

## 2012-10-11 ENCOUNTER — Telehealth: Payer: Self-pay | Admitting: Family Medicine

## 2012-10-12 ENCOUNTER — Telehealth: Payer: Self-pay | Admitting: Family Medicine

## 2012-10-12 DIAGNOSIS — R079 Chest pain, unspecified: Secondary | ICD-10-CM

## 2012-10-12 DIAGNOSIS — M549 Dorsalgia, unspecified: Secondary | ICD-10-CM

## 2012-10-12 MED ORDER — CYCLOBENZAPRINE HCL 10 MG PO TABS: 10.0000 mg | ORAL_TABLET | Freq: Three times a day (TID) | ORAL | Status: DC | PRN

## 2012-10-12 NOTE — Telephone Encounter (Signed)
done

## 2012-10-13 ENCOUNTER — Other Ambulatory Visit: Payer: Self-pay | Admitting: Family Medicine

## 2012-10-13 DIAGNOSIS — R079 Chest pain, unspecified: Secondary | ICD-10-CM

## 2012-10-13 DIAGNOSIS — M549 Dorsalgia, unspecified: Secondary | ICD-10-CM

## 2012-10-13 MED ORDER — CYCLOBENZAPRINE HCL 10 MG PO TABS: 10.0000 mg | ORAL_TABLET | Freq: Three times a day (TID) | ORAL | Status: DC | PRN

## 2012-10-13 NOTE — Telephone Encounter (Signed)
Flexeril sent to pharm

## 2012-10-13 NOTE — Telephone Encounter (Signed)
Please advise 

## 2012-10-13 NOTE — Telephone Encounter (Signed)
spoke and clarified with pt

## 2012-10-26 ENCOUNTER — Encounter: Payer: Self-pay | Admitting: *Deleted

## 2012-10-26 ENCOUNTER — Other Ambulatory Visit: Payer: Self-pay | Admitting: Family Medicine

## 2012-10-26 ENCOUNTER — Other Ambulatory Visit: Payer: Medicaid Other | Admitting: Obstetrics and Gynecology

## 2012-10-26 ENCOUNTER — Telehealth: Payer: Self-pay | Admitting: *Deleted

## 2012-10-26 DIAGNOSIS — E785 Hyperlipidemia, unspecified: Secondary | ICD-10-CM

## 2012-10-26 NOTE — Telephone Encounter (Signed)
Ins will not cover welchol with out pt first trying and failing two of the preferred meds which are :  Cholestyramine,  Or the cholestyramine light,  Colestid  Colestipol, Questran or Questran light.  Thanks in advance for your help in this matter.

## 2012-10-26 NOTE — Telephone Encounter (Signed)
I see where she is already taking colestid and should continue with this.

## 2012-11-08 ENCOUNTER — Telehealth: Payer: Self-pay | Admitting: Family Medicine

## 2012-11-17 ENCOUNTER — Telehealth: Payer: Self-pay | Admitting: Family Medicine

## 2012-11-17 NOTE — Telephone Encounter (Signed)
Appt given for tomorrow per patient request 

## 2012-11-18 ENCOUNTER — Ambulatory Visit (INDEPENDENT_AMBULATORY_CARE_PROVIDER_SITE_OTHER): Payer: Medicaid Other

## 2012-11-18 ENCOUNTER — Telehealth: Payer: Self-pay | Admitting: Family Medicine

## 2012-11-18 ENCOUNTER — Ambulatory Visit (INDEPENDENT_AMBULATORY_CARE_PROVIDER_SITE_OTHER): Payer: Medicaid Other | Admitting: Family Medicine

## 2012-11-18 ENCOUNTER — Encounter: Payer: Self-pay | Admitting: Family Medicine

## 2012-11-18 VITALS — BP 114/84 | HR 89 | Temp 97.2°F | Ht 63.0 in | Wt 135.0 lb

## 2012-11-18 DIAGNOSIS — M549 Dorsalgia, unspecified: Secondary | ICD-10-CM

## 2012-11-18 DIAGNOSIS — R079 Chest pain, unspecified: Secondary | ICD-10-CM

## 2012-11-18 DIAGNOSIS — M542 Cervicalgia: Secondary | ICD-10-CM

## 2012-11-18 DIAGNOSIS — Z23 Encounter for immunization: Secondary | ICD-10-CM

## 2012-11-18 MED ORDER — CYCLOBENZAPRINE HCL 10 MG PO TABS: 10.0000 mg | ORAL_TABLET | Freq: Three times a day (TID) | ORAL | Status: DC | PRN

## 2012-11-18 NOTE — Progress Notes (Signed)
  Subjective:    Patient ID: Penny Moreno, female    DOB: Jul 13, 1965, 47 y.o.   MRN: [id number]  HPI This 47 y.o. female presents for evaluation of polyarthralgias.  She has been hurting more And is out of her cyclobenzaprine.  She has some muscle spasms in her lumbar spine And some neck discomfort.  She states she quit taking mobic because it doesn't work and Has been using ice on her neck.  She is here with xrays from her chiropractor.   Review of Systems C/o arthritis, neck and back pain, and chest discomfort. No chest pain, SOB, HA, dizziness, vision change, N/V, diarrhea, constipation, dysuria, urinary urgency or frequency or rash.     Objective:   Physical Exam Vital signs noted  Well developed well nourished female.  HEENT - Head atraumatic Normocephalic                Eyes - PERRLA, Conjuctiva - clear Sclera- Clear EOMI                Ears - EAC's Wnl TM's Wnl Gross Hearing WNL                Nose - Nares patent                 Throat - oropharanx wnl Respiratory - Lungs CTA bilateral Cardiac - RRR S1 and S2 without murmur GI - Abdomen soft Nontender and bowel sounds active x 4 MS - TTP cervical paraspinous muscles and myofascial area, TTP and muscle spasms in her LS  Spine.       Assessment & Plan:  Chest pain - Plan: DG Thoracic Spine 2 View, DG Chest 2 View, Arthritis Panel.  She is awaiting a cardiac referral but today her chest pain is due to MS and myalgias.  Back pain - Plan: Ambulatory referral to Physical Therapy, DG Lumbar Spine 2-3 Views, cyclobenzaprine (FLEXERIL) 10 MG tablet, Arthritis Panel, DISCONTINUED: cyclobenzaprine (FLEXERIL) 10 MG tablet  Neck pain - Plan: Ambulatory referral to Physical Therapy, DG Thoracic Spine 2 View, DG Cervical Spine Complete, cyclobenzaprine (FLEXERIL) 10 MG tablet, Arthritis Panel, DISCONTINUED: cyclobenzaprine (FLEXERIL) 10 MG tablet  Deatra Canter FNP

## 2012-11-18 NOTE — Patient Instructions (Signed)
Reactive Arthritis Reactive arthritis (formerly known as Reiter's syndrome) is a group of illnesses that involves redness, soreness, and swelling (inflammation) of the joints, genital tract, and eyes. It is most common in males between the ages of 63 and 19. Reiter's syndrome is one specific type of reactive arthritis. CAUSES   This condition may follow food poisoning caused by a bacterial infection of Salmonella, Shigella, Camphylobacter, or Yersinia.  This condition can also follow an infection of the sexually transmitted disease (STD) chlamydia.  Having a certain gene may make you more prone to develop reactive arthritis (HLA-B27 gene). SYMPTOMS   Inflammation of the joints, especially large joints. Hip, knee, and ankle pain are common.  Low back or foot pain.  Thick, crusty, reddish-purple sores on the palms of the hands and soles of the feet.  Low-grade fever.  Frequent or painful urination (dysuria).  Genital sores, which may be painful and can become infected.  Pelvic pain.  Blurred vision, eye pain, and red, sore eyes (conjunctivitis). Eyelids may stick together in the morning.  Sores in the mouth. These may be painful or painless. DIAGNOSIS  The diagnosis is based on your symptoms. This process may be delayed because the symptoms may occur at different times. A history and physical exam may help suggest the cause of the arthritis. Blood tests may be done to rule out other forms of arthritis or to see if you have the HLA-B27 gene. Joint X-rays and urine tests (urinalysis) may also be done. TREATMENT  The goal of treatment is to relieve symptoms and to treat any underlying infection.  Medicines that kill germs (antibiotics) are often given to treat an initial infection, if found. However, they may not stop reactive arthritis from developing. It is best for your sexual partners to be treated with antibiotics as well, even if they have no symptoms and do not test positive for an  STD. Reactive arthritis itself is not sexually transmitted and cannot be passed from person-to-person (noncontagious), but an infection that triggers it may be passed from person-to-person.  Nonsteroidal anti-inflammatory drugs (NSAIDs) are often used to treat reactive arthritis. These medicines reduce pain and swelling of the joints and decrease stiffness. However, they do not prevent further joint damage.  Eye problems and skin sores will go away on their own.  Long-term (chronic) reactive arthritis may need to be treated with a disease-modifying antirheumatic drug (DMARD), such as sulfasalazine or methotrexate. These medicines take longer to become fully effective but can cause a decrease in symptoms (remission).  In some cases, very inflamed joints may be injected with corticosteroids to reduce inflammation. PREVENTION  Preventing STDs and gastrointestinal infection may help prevent this disease. Wearing a condom during sexual intercourse can reduce your risk of STDs. HOME CARE INSTRUCTIONS   Eat a well-balanced diet. This is helpful in almost all disease states.  Exercise regularly. This will help maintain muscle strength. This helps your joints stay aligned and have less pain. Low-impact exercises, such as swimming, walking, water aerobics, and bicycling, can reduce pain and help maintain strength and flexibility.  Put ice on the sore joint.  Put ice in a plastic bag.  Place a towel between your skin and the bag.  Leave the ice on for 15-20 minutes at a time, 3-4 times a day.  You may also use a warm heating pad as directed by your caregiver.  Alternate heat and cold. Cold is usually best following exercise. Heat is best for warming up prior to exercising  or using your joints.  Do not sleep with a heating pad.If you are diabetic, do not use a heating pad unless instructed to do so. SEEK MEDICAL CARE IF:   You develop hot, swollen joints which are getting worse.  You have an  oral temperature above 102 F (38.9 C).  Any of your symptoms seem to be getting worse rather than better. FOR MORE INFORMATION  American College of Rheumatology: www.rheumatology.org Document Released: 12/17/2004 Document Revised: 04/13/2011 Document Reviewed: 05/06/2009 [facility] Patient Information 2014 [place], [place].

## 2012-11-19 LAB — ARTHRITIS PANEL
Anti Nuclear Antibody(ANA): NEGATIVE
Rhuematoid fact SerPl-aCnc: 16.7 IU/mL — ABNORMAL HIGH (ref 0.0–13.9)
Sed Rate: 26 mm/hr (ref 0–32)
Uric Acid: 3.8 mg/dL (ref 2.5–7.1)

## 2012-11-21 ENCOUNTER — Other Ambulatory Visit: Payer: Self-pay | Admitting: Family Medicine

## 2012-11-21 DIAGNOSIS — M25559 Pain in unspecified hip: Secondary | ICD-10-CM

## 2012-11-21 NOTE — Progress Notes (Signed)
  Subjective:    Patient ID: Penny Moreno, female    DOB: [birth year], 47 y.o.   MRN: [id number]  HPI    Review of Systems     Objective:   Physical Exam   CXR - normal Lumbar spine - Normal no fx Thoracic spine- no fx Cervical spine - Normal no fx Prelimnary reading by Angeline Slim     Assessment & Plan:

## 2012-11-21 NOTE — Telephone Encounter (Signed)
No return call from patient.

## 2012-11-22 ENCOUNTER — Encounter: Payer: Self-pay | Admitting: Family Medicine

## 2012-11-22 ENCOUNTER — Ambulatory Visit (INDEPENDENT_AMBULATORY_CARE_PROVIDER_SITE_OTHER): Payer: Medicaid Other | Admitting: Family Medicine

## 2012-11-22 VITALS — BP 99/66 | HR 89 | Temp 97.8°F | Ht 63.5 in | Wt 139.2 lb

## 2012-11-22 DIAGNOSIS — M542 Cervicalgia: Secondary | ICD-10-CM

## 2012-11-22 DIAGNOSIS — M549 Dorsalgia, unspecified: Secondary | ICD-10-CM

## 2012-11-22 MED ORDER — CYCLOBENZAPRINE HCL 10 MG PO TABS: 10.0000 mg | ORAL_TABLET | Freq: Three times a day (TID) | ORAL | Status: DC | PRN

## 2012-11-22 MED ORDER — HYDROCODONE-ACETAMINOPHEN 5-325 MG PO TABS: 1.0000 | ORAL_TABLET | Freq: Four times a day (QID) | ORAL | Status: DC | PRN

## 2012-11-22 NOTE — Patient Instructions (Signed)

## 2012-11-22 NOTE — Progress Notes (Signed)
  Subjective:    Patient ID: Ricarda Frame, female    DOB: 10-Feb-1965, 47 y.o.   MRN: [id number]  HPI This 47 y.o. female presents for evaluation of persistent neck pain and back pain. She has had arthritis labs which show elevated RF.  She is here to go over labs And xrays.    Review of Systems C/o neck and back pain. No chest pain, SOB, HA, dizziness, vision change, N/V, diarrhea, constipation, dysuria, urinary urgency or frequency or rash.     Objective:   Physical Exam  Vital signs noted  Well developed well nourished female.  HEENT - Head atraumatic Normocephalic                Eyes - PERRLA, Conjuctiva - clear Sclera- Clear EOMI                Ears - EAC's Wnl TM's Wnl Gross Hearing WNL                Nose - Nares patent                 Throat - oropharanx wnl Respiratory - Lungs CTA bilateral Cardiac - RRR S1 and S2 without murmur GI - Abdomen soft Nontender and bowel sounds active x 4 Extremities - No edema. Neuro - Grossly intact.      Assessment & Plan:  Back pain - Plan: HYDROcodone-acetaminophen (NORCO) 5-325 MG per tablet, cyclobenzaprine (FLEXERIL) 10 MG tablet  Neck pain - Plan: HYDROcodone-acetaminophen (NORCO) 5-325 MG per tablet, cyclobenzaprine (FLEXERIL) 10 MG tablet  Elevated RF - Referral to RA.    Deatra Canter FNP

## 2012-11-23 NOTE — Telephone Encounter (Signed)
Discussed at visit

## 2012-12-07 DIAGNOSIS — R768 Other specified abnormal immunological findings in serum: Secondary | ICD-10-CM | POA: Insufficient documentation

## 2012-12-07 DIAGNOSIS — M545 Low back pain, unspecified: Secondary | ICD-10-CM | POA: Insufficient documentation

## 2012-12-16 ENCOUNTER — Telehealth: Payer: Self-pay | Admitting: Family Medicine

## 2012-12-19 ENCOUNTER — Other Ambulatory Visit: Payer: Self-pay | Admitting: Family Medicine

## 2012-12-19 DIAGNOSIS — M549 Dorsalgia, unspecified: Secondary | ICD-10-CM

## 2012-12-19 DIAGNOSIS — M542 Cervicalgia: Secondary | ICD-10-CM

## 2012-12-19 MED ORDER — HYDROCODONE-ACETAMINOPHEN 5-325 MG PO TABS: 1.0000 | ORAL_TABLET | Freq: Four times a day (QID) | ORAL | Status: DC | PRN

## 2012-12-22 ENCOUNTER — Other Ambulatory Visit: Payer: Medicaid Other

## 2013-01-02 ENCOUNTER — Telehealth: Payer: Self-pay | Admitting: Family Medicine

## 2013-01-02 NOTE — Telephone Encounter (Signed)
Pt notified that she would need to be evaluated in the clinic ASAP  She wants to see her OB Gyn She will call back here if she can't get appt with them

## 2013-01-03 ENCOUNTER — Ambulatory Visit (INDEPENDENT_AMBULATORY_CARE_PROVIDER_SITE_OTHER): Payer: Medicaid Other | Admitting: Family Medicine

## 2013-01-03 ENCOUNTER — Encounter: Payer: Self-pay | Admitting: Family Medicine

## 2013-01-03 VITALS — BP 109/68 | HR 91 | Temp 97.3°F | Ht 63.5 in | Wt 140.0 lb

## 2013-01-03 DIAGNOSIS — N61 Mastitis without abscess: Secondary | ICD-10-CM

## 2013-01-03 MED ORDER — DICLOXACILLIN SODIUM 500 MG PO CAPS: 500.0000 mg | ORAL_CAPSULE | Freq: Four times a day (QID) | ORAL | Status: DC

## 2013-01-03 NOTE — Progress Notes (Signed)
   Subjective:    Patient ID: Penny Moreno, female    DOB: [birth year], 47 y.o.   MRN: [id number]  HPI  This 47 y.o. female presents for evaluation of right breast pain for 2 days. She has had normal mammogram 2 days prior to noticing the mass in Her right breast.  She states she had redness and pain at first and this Has gone away but she is still tender.  Review of Systems C/o right breast pain and mass. No chest pain, SOB, HA, dizziness, vision change, N/V, diarrhea, constipation, dysuria, urinary urgency or frequency, myalgias, arthralgias or rash.     Objective:   Physical Exam  Vital signs noted  Well developed well nourished female.  HEENT - Head atraumatic Normocephalic                Eyes - PERRLA, Conjuctiva - clear Sclera- Clear EOMI                Ears - EAC's Wnl TM's Wnl Gross Hearing WNL Respiratory - Lungs CTA bilateral Cardiac - RRR S1 and S2 without murmur Right Breast - 2 cm mass under right areola that is tender.  No nipple discharge.      Assessment & Plan:  Mastitis - Plan: dicloxacillin (DYNAPEN) 500 MG capsule Po qid x 10 days #40, warm compress to right breast. Follow up if does not resolve and probably due to mammogram.  Deatra Canter FNP

## 2013-01-03 NOTE — Patient Instructions (Signed)
Mastitis  Mastitis is a bacterial infection of the breast tissue. CAUSES  Bacteria causes infection by entering the breast tissue through cuts or openings in the skin. Typically, this occurs with breastfeeding due to cracked or irritated skin. It can be associated with plugged ducts. Nipple piercing can also lead to mastitis. SYMPTOMS  In mastitis, an area of the breast becomes swollen, red, tender, and painful. You may notice you have a fever and swelling of the glands under your arm on that side. If the infection is allowed to progress, a collection of pus (abscess) may develop. DIAGNOSIS  Your caregiver can diagnose mastitis based on your symptoms and upon examination. The diagnosis can be confirmed if pus can be expressed from the breast. This pus can be examined in the lab to determine which bacteria are present. If an abscess has developed, the fluid in the abscess can be removed with a needle. This is used to confirm the diagnosis and determine the bacteria present. In most cases, pus will not be present. Blood tests can be done to determine if your body is fighting a bacterial infection. Sometimes, a mammogram or ultrasound will be recommended to exclude other breast diseases including cancer. Other rare forms of mastitis:  Tuberculosis mastitis is rare. The TB germ can affect the breast if it is present in some other part of the body. The breast may be slightly tender with a mass, but not tender or painful.  Syphilis of the nipple usually has an ulcer that is not tender.  Actinomycosis is a very rare bacterial infection of the breast that presents as a mass in the breast that is not tender or painful.  Phlebitis (inflammation of blood vessels) of the breast is an inflammation of the veins in the breast. It may be caused by tight fitting bras, surgery, or trauma to the breast.  Inflammatory carcinoma of the breast looks like mastitis because the breasts are red, swollen, or tender, but it  is a rare form of breast cancer. TREATMENT  Antibiotic medication is used to treat the bacterial infection. Your caregiver will determine which bacteria are most likely to be causing the infection and select an antibiotic. This is sometimes changed based on the results of cultures, or if there is no response to the antibiotic selected. Antibiotics are usually given by mouth. If you are breastfeeding, it is important to continue to empty the breast. Your caregiver can tell you whether or not this milk is safe for your infant, or needs to be thrown away. Pain can usually be treated with medication. HOME CARE INSTRUCTIONS   Take your antibiotics as directed. Finish them even if you start to feel better.  Only take over-the-counter or prescription medication for pain, discomfort, or fever as directed by your caregiver.  If breastfeeding, keep your nipples clean and dry. Your caregiver may tell you to stop nursing until he or she feels it is safe for your baby. Use a breast pump as instructed if forced to stop nursing.  Do not wear a tight bra. Wear a good support bra.  Empty the first breast completely before going to the other breast. If your baby is not emptying your breasts completely for some reason, use a breast pump to empty your breasts.  If you go back to work, pump your breasts while at work to stay in time with your nursing schedule.  Increase your fluid intake especially if you have a fever.  Avoid having your breasts get overly   filled with milk (engorged). SEEK MEDICAL CARE IF:   You develop pus-like (purulent) discharge from the breast.  Your symptoms get worse.  You do not seem to be responding to your treatment within 2 days. SEEK IMMEDIATE MEDICAL CARE IF:   You have a fever.  Your pain and swelling is getting worse.  You develop pain that is not controlled with medicine.  You develop a red line extending from the breast toward your armpit. Document Released:  01/19/2005 Document Revised: 04/13/2011 Document Reviewed: 08/19/2012 [employer] Patient Information 2014 [employer].  

## 2013-01-03 NOTE — Telephone Encounter (Signed)
She can come in and be seen here or she can see her OBGYN

## 2013-02-08 ENCOUNTER — Other Ambulatory Visit: Payer: Medicaid Other | Admitting: Obstetrics and Gynecology

## 2013-02-10 DIAGNOSIS — M1990 Unspecified osteoarthritis, unspecified site: Secondary | ICD-10-CM | POA: Insufficient documentation

## 2013-02-22 ENCOUNTER — Other Ambulatory Visit: Payer: Medicaid Other | Admitting: Obstetrics and Gynecology

## 2013-04-21 ENCOUNTER — Other Ambulatory Visit (HOSPITAL_COMMUNITY)
Admission: RE | Admit: 2013-04-21 | Discharge: 2013-04-21 | Disposition: A | Discharge status: Home / Self Care | Payer: Medicaid Other | Source: Ambulatory Visit | Attending: Obstetrics and Gynecology | Admitting: Obstetrics and Gynecology

## 2013-04-21 ENCOUNTER — Encounter: Payer: Self-pay | Admitting: Obstetrics and Gynecology

## 2013-04-21 ENCOUNTER — Ambulatory Visit (INDEPENDENT_AMBULATORY_CARE_PROVIDER_SITE_OTHER): Payer: Medicaid Other | Admitting: Obstetrics and Gynecology

## 2013-04-21 VITALS — BP 112/70 | Ht 63.0 in | Wt 146.0 lb

## 2013-04-21 DIAGNOSIS — Z01419 Encounter for gynecological examination (general) (routine) without abnormal findings: Secondary | ICD-10-CM

## 2013-04-21 DIAGNOSIS — Z1212 Encounter for screening for malignant neoplasm of rectum: Secondary | ICD-10-CM

## 2013-04-21 DIAGNOSIS — Z0000 Encounter for general adult medical examination without abnormal findings: Secondary | ICD-10-CM

## 2013-04-21 DIAGNOSIS — R87810 Cervical high risk human papillomavirus (HPV) DNA test positive: Secondary | ICD-10-CM | POA: Insufficient documentation

## 2013-04-21 DIAGNOSIS — Z1151 Encounter for screening for human papillomavirus (HPV): Secondary | ICD-10-CM | POA: Insufficient documentation

## 2013-04-21 DIAGNOSIS — Z124 Encounter for screening for malignant neoplasm of cervix: Secondary | ICD-10-CM | POA: Insufficient documentation

## 2013-04-21 DIAGNOSIS — N951 Menopausal and female climacteric states: Secondary | ICD-10-CM

## 2013-04-21 LAB — POC HEMOCCULT BLD/STL (OFFICE/1-CARD/DIAGNOSTIC): Fecal Occult Blood, POC: NEGATIVE

## 2013-04-21 LAB — HEMOCCULT GUIAC POC 1CARD (OFFICE): Fecal Occult Blood, POC: NEGATIVE

## 2013-04-21 NOTE — Patient Instructions (Addendum)
Conjugated Estrogens; Bazedoxifene tablets--Check out Duavee in particular Please call the office next week if you would like to discuss further or to request a prescription What is this medicine? CONJUGATED ESTROGENS; BAZEDOXIFENE (CON ju gate ed ESS troe jenz; BAY ze DOX i feen) is used as hormone replacement in menopausal women who still have their uterus. This medicine helps to treat hot flashes and prevent osteoporosis (weak bones). This medicine may be used for other purposes; ask your health care provider or pharmacist if you have questions. COMMON BRAND NAME(S): DUAVEE What should I tell my health care provider before I take this medicine? They need to know if you have any of these conditions: -abnormal vaginal bleeding -blood vessel disease or blood clots -breast, cervical, endometrial, ovarian, liver, or uterine cancer -dementia -diabetes -endometriosis -fibroids -gallbladder disease -heart disease or recent heart attack -hereditary angioedema -high blood pressure -high cholesterol -high level of calcium in the blood -kidney disease -liver disease -mental depression -migraine headaches -porphyria -protein C deficiency -protein S deficiency -seizure disorder -stroke -systemic lupus erythematosus -thyroid disorder -tobacco smoker -an unusual or allergic reaction to estrogens, bazedoxifene, other medicines, foods, dyes, or preservatives -pregnant or trying to get pregnant -breast-feeding How should I use this medicine? Take this medicine by mouth with a glass of water. Follow the directions on the prescription label. Take your medicine at regular intervals, at the same time each day. Do not take your medicine more often than directed. A patient package insert for the product will be given with each prescription and refill. Read this sheet carefully each time. Talk to your pediatrician regarding the use of this medicine in children. This medicine is not approved for use  in children. Overdosage: If you think you've taken too much of this medicine contact a poison control center or emergency room at once. Overdosage: If you think you have taken too much of this medicine contact a poison control center or emergency room at once. NOTE: This medicine is only for you. Do not share this medicine with others. What if I miss a dose? If you miss a dose, take it as soon as you can. If it is almost time for your next dose, take only that dose. Do not take double or extra doses. What may interact with this medicine? -aromatase inhibitors like aminoglutethimide, anastrozole, exemestane, letrozole, testolactone -metyrapone  This medicine may also interact with the following medications: -barbiturates, such as phenobarbital -carbamazepine -clarithromycin -erythromycin -grapefruit juice -medicines for fungal infections like ketoconazole and itraconazole -phenytoin -rifampin -ritonavir -St. Arther Heisler's Wort -thyroid hormones This list may not describe all possible interactions. Give your health care provider a list of all the medicines, herbs, non-prescription drugs, or dietary supplements you use. Also tell them if you smoke, drink alcohol, or use illegal drugs. Some items may interact with your medicine. What should I watch for while using this medicine? Do not take a progestin product or additional estrogen or estrogen-like products while taking this drug. Discuss the risks and benefits of taking this drug with your prescriber. Visit your health care professional for regular checks on your progress. You will need a regular breast and pelvic exam and Pap smear while on this medicine. You should also discuss the need for regular mammograms with your health care professional, and follow his or her guidelines for these tests. This medicine can make your body retain fluid, making your fingers, hands, or ankles swell. Your blood pressure can go up. Contact your doctor or health care  professional  if you feel you are retaining fluid. You should make sure you get enough calcium and vitamin D in your diet while you are taking this medicine. Discuss your dietary needs with your health care professional or nutritionist. Exercise may help to prevent bone loss. Discuss your exercise needs with your doctor or health care professional. This medicine can rarely cause blood clots. You should avoid long periods of bed rest while taking this medicine. If you are going to have surgery, tell your doctor or health care professional that you are taking this medicine. This medicine should be stopped at least 3 days before surgery. After surgery, it should be restarted only after you are walking again. It should not be restarted while you still need long periods of bed rest. Smoking increases the risk of getting a blood clot or having a stroke while you are taking this medicine, especially if you are more than 46 years old. You are strongly advised not to smoke. If you have any reason to think you are pregnant; stop taking this medicine at once and contact your doctor or health care professional. If you wear contact lenses and notice visual changes, or if the lenses begin to feel uncomfortable, consult your eye care specialist. What side effects may I notice from receiving this medicine? Side effects that you should report to your doctor or health care professional as soon as possible: -allergic reactions like skin rash, itching or hives, swelling of the face, lips, or tongue -breast tissue changes or discharge -changes in vision -chest pain -confusion, trouble speaking or understanding -dark urine -general ill feeling or flu-like symptoms -light-colored stools -nausea, vomiting -pain, swelling, warmth in the leg -right upper belly pain -severe headaches -shortness of breath -sudden numbness or weakness of the face, arm or leg -trouble walking, dizziness, loss of balance or  coordination -unusual vaginal bleeding -yellowing of the eyes or skin  Side effects that usually do not require medical attention (Report these to your doctor or health care professional if they continue or are bothersome.): -abdominal pain -diarrhea -dizziness -hair loss -increased hunger or thirst -increased urination -nausea -symptoms of vaginal infection like itching, irritation or unusual discharge -unusually weak or tired -upset stomach This list may not describe all possible side effects. Call your doctor for medical advice about side effects. You may report side effects to FDA at 1-800-FDA-1088. Where should I keep my medicine? Keep out of the reach of children. Store at room temperature between 15 and 30 degrees C (59 and 86 degrees F). Throw away any unused medicine after the expiration date. NOTE: This sheet is a summary. It may not cover all possible information. If you have questions about this medicine, talk to your doctor, pharmacist, or health care provider.  2014, Elsevier/Gold Standard. (2011-11-09 15:15:52)

## 2013-04-21 NOTE — Progress Notes (Signed)
This chart was scribed by Jenne Campus, Medical Scribe, for Dr. [name] on 04/21/13 at 10:18 AM. This chart was reviewed by Dr. [name] and is accurate.   Assessment:  Annual Gyn Exam Early postmenopausal female PAP/Annual   Plan:  1. pap smear done, next pap due 1 year 2. return annually or prn 3    Annual mammogram advised 4.   [facility] Brochures given. Pt to call next week for further discussion Subjective:  Penny Moreno is a 48 y.o. female No obstetric history on file. who presents for annual exam. Patient's last menstrual period was 09/20/2011. One menses since BTL Sept 13. No ablation. The patient has complaints today of hot flashes/night sweats.   Seeing Josie Saunders for PCP Neurologist for fibromyalgia   The following portions of the patient's history were reviewed and updated as appropriate: allergies, current medications, past family history, past medical history, past social history, past surgical history and problem list.  Review of Systems Constitutional: positive for night sweats and sweats Gastrointestinal: negative, denies any bowel problems Genitourinary: negative, denies any urinary problems  Objective:  BP 112/70  Ht 5\' 3"  (1.6 m)  Wt 146 lb (66.225 kg)  BMI 25.87 kg/m2  LMP 09/20/2011   BMI: Body mass index is 25.87 kg/(m^2).  Chaperone present for exam which was performed with pt's permission General Appearance: Alert, appropriate appearance for age. No acute distress HEENT: Grossly normal Neck / Thyroid:  Cardiovascular: RRR; normal S1, S2, no murmur Lungs: CTA bilaterally Back: No CVAT Breast Exam: Not done Gastrointestinal: Soft, non-tender, no masses or organomegaly Pelvic Exam: External genitalia: normal general appearance Vaginal: normal without tenderness, induration or masses and atrophic mucosa Cervix: normal appearance and thin prep PAP obtained Adnexa: normal bimanual exam Uterus: normal single, nontender Good  anterior and posterior support Rectovaginal: normal rectal, no masses and guaiac positive stool obtained Lymphatic Exam: Non-palpable nodes in neck, clavicular, axillary, or inguinal regions  Skin: no rash or abnormalities Neurologic: Normal gait and speech, no tremor  Psychiatric: Alert and oriented, appropriate affect.  Urinalysis:Not done  A: 1. Early postmenopausal female for PAP/Annual  P: 1. Hormone Therapy discussed-Pt given [facility] brochures 2. Pt to call back next week for Rx   [name]. MD Pgr [phone] 10:41 AM

## 2013-04-28 ENCOUNTER — Telehealth: Payer: Self-pay | Admitting: *Deleted

## 2013-04-28 NOTE — Telephone Encounter (Signed)
Message copied by Doyne Keel on Fri Apr 28, 2013  8:58 AM ------      Message from: Jonnie Kind      Created: Wed Apr 26, 2013  7:12 AM       This pap is mildly abnormal and should be followed up with a colposcopy. ------

## 2013-04-28 NOTE — Telephone Encounter (Signed)
Message copied by Doyne Keel on Fri Apr 28, 2013  8:49 AM ------      Message from: Jonnie Kind      Created: Wed Apr 26, 2013  7:12 AM       This pap is mildly abnormal and should be followed up with a colposcopy. ------

## 2013-04-28 NOTE — Telephone Encounter (Signed)
Pt informed of abnormal pap (ASCUS), call transferred to front staff for an appt for colposcopy.

## 2013-05-01 ENCOUNTER — Other Ambulatory Visit: Payer: Self-pay | Admitting: Family Medicine

## 2013-05-02 NOTE — Telephone Encounter (Signed)
Last ov 01/03/13 last refill 04/06/13.

## 2013-05-08 ENCOUNTER — Encounter: Payer: Medicaid Other | Admitting: Obstetrics and Gynecology

## 2013-05-16 ENCOUNTER — Encounter: Payer: Medicaid Other | Admitting: Obstetrics and Gynecology

## 2013-05-31 ENCOUNTER — Ambulatory Visit (INDEPENDENT_AMBULATORY_CARE_PROVIDER_SITE_OTHER): Payer: Medicaid Other | Admitting: Obstetrics and Gynecology

## 2013-05-31 ENCOUNTER — Encounter: Payer: Self-pay | Admitting: Obstetrics and Gynecology

## 2013-05-31 ENCOUNTER — Other Ambulatory Visit: Payer: Self-pay | Admitting: Obstetrics and Gynecology

## 2013-05-31 VITALS — BP 130/86 | Ht 63.0 in | Wt 146.0 lb

## 2013-05-31 DIAGNOSIS — N870 Mild cervical dysplasia: Secondary | ICD-10-CM

## 2013-05-31 NOTE — Progress Notes (Signed)
This chart was scribed by Jenne Campus, Medical Scribe, for Dr. [name] on 05/31/13 at 3:22 PM. This chart was reviewed by Dr. [name] and is accurate.   Patient ID: Penny Moreno, female   DOB: 21-Mar-1965, 48 y.o.   MRN: [id number]  Chief Complaint  Patient presents with  . Colposcopy    HPI Penny Moreno is a 48 y.o. female for a colposcopy/ ECC/cervical biopsy  HPI  Indications: Pap smear on March 2015 showed: ATYPICAL SQUAMOUS CELLS OF UNDETERMINED SIGNIFICANCE (ASC-US). HPV high risk. Previous colposcopy: not done. Prior cervical treatment: no treatment.  Review of Systems Review of Systems +abdominal bloating and flatulence. Encouraged pt to use Gas x.  No complaints.  Blood pressure 130/86, height 5\' 3"  (1.6 m), weight 146 lb (66.225 kg), last menstrual period 09/20/2011.  Physical Exam Physical Exam  chaperone present for exam and procedure.   Procedure Details  The risks and benefits of the procedure and Written informed consent obtained.  Speculum placed in vagina and excellent visualization of cervix achieved, cervix swabbed x 3 with acetic acid solution.  Specimens: ECC, cervical biopsy at 1 o'clock  Complications: none.  Plan  Specimens labelled and sent to Pathology. Will call pt to discuss Pathology results in 2 weeks.   Jonnie Kind 05/31/2013, 3:22 PM

## 2013-05-31 NOTE — Patient Instructions (Signed)
We will call you to discuss the Pathology results in 2 weeks. Please call the office if you have not heard from [place]   Colposcopy, Care After Refer to this sheet in the next few weeks. These instructions provide you with information on caring for yourself after your procedure. Your health care provider may also give you more specific instructions. Your treatment has been planned according to current medical practices, but problems sometimes occur. Call your health care provider if you have any problems or questions after your procedure. WHAT TO EXPECT AFTER THE PROCEDURE  After your procedure, it is typical to have the following:  Cramping. This often goes away in a few minutes.  Soreness. This may last for 2 days.  Lightheadedness. Lie down for a few minutes if this occurs. You may also have some bleeding or dark discharge for a few days. You may need to wear a sanitary pad during this time. HOME CARE INSTRUCTIONS  Avoid sex, douching, and using tampons for 3 days or as directed by your health care provider.  Only take over-the-counter or prescription medicines as directed by your health care provider. Do not take aspirin because it can cause bleeding.  Continue to take birth control pills if you are on them.  Not all test results are available during your visit. If your test results are not back during the visit, make an appointment with your health care provider to find out the results. Do not assume everything is normal if you have not heard from your health care provider or the medical facility. It is important for you to follow up on all of your test results.  Follow your health care provider's advice regarding activity, follow-up visits, and follow-up Pap tests. SEEK MEDICAL CARE IF:  You develop a rash.  You have problems with your medicine. SEEK IMMEDIATE MEDICAL CARE IF:  You are bleeding heavily or are passing blood clots.  You have a fever.  You have abnormal vaginal  discharge.  You are having cramps that do not go away after taking your pain medicine.  You feel lightheaded, dizzy, or faint.  You have stomach pain. Document Released: 11/09/2012 Document Reviewed: 08/18/2012 [facility] Patient Information 2014 [place]. in 2 weeks.

## 2013-06-20 DIAGNOSIS — Z0289 Encounter for other administrative examinations: Secondary | ICD-10-CM

## 2013-06-22 ENCOUNTER — Other Ambulatory Visit: Payer: Self-pay | Admitting: Family Medicine

## 2013-06-22 ENCOUNTER — Ambulatory Visit (INDEPENDENT_AMBULATORY_CARE_PROVIDER_SITE_OTHER): Payer: Medicaid Other | Admitting: Family Medicine

## 2013-06-22 ENCOUNTER — Ambulatory Visit (INDEPENDENT_AMBULATORY_CARE_PROVIDER_SITE_OTHER): Payer: Medicaid Other

## 2013-06-22 ENCOUNTER — Encounter: Payer: Self-pay | Admitting: Family Medicine

## 2013-06-22 VITALS — BP 109/74 | HR 76 | Temp 98.3°F | Wt 143.4 lb

## 2013-06-22 DIAGNOSIS — M79609 Pain in unspecified limb: Secondary | ICD-10-CM

## 2013-06-22 DIAGNOSIS — M542 Cervicalgia: Secondary | ICD-10-CM

## 2013-06-22 DIAGNOSIS — R52 Pain, unspecified: Secondary | ICD-10-CM

## 2013-06-22 DIAGNOSIS — S93409A Sprain of unspecified ligament of unspecified ankle, initial encounter: Secondary | ICD-10-CM

## 2013-06-22 DIAGNOSIS — M549 Dorsalgia, unspecified: Secondary | ICD-10-CM

## 2013-06-22 DIAGNOSIS — S93401A Sprain of unspecified ligament of right ankle, initial encounter: Secondary | ICD-10-CM

## 2013-06-22 MED ORDER — HYDROCODONE-ACETAMINOPHEN 5-325 MG PO TABS: 1.0000 | ORAL_TABLET | Freq: Four times a day (QID) | ORAL | Status: DC | PRN

## 2013-06-22 NOTE — Progress Notes (Signed)
   Subjective:    Patient ID: Penny Moreno, female    DOB: April 16, 1965, 48 y.o.   MRN: [id number]  HPI This 48 y.o. female presents for evaluation of twisted right ankle and right ankle pain and swelling.   Review of Systems No chest pain, SOB, HA, dizziness, vision change, N/V, diarrhea, constipation, dysuria, urinary urgency or frequency, myalgias, arthralgias or rash.     Objective:   Physical Exam  Vital signs noted  Well developed well nourished female.  HEENT - Head atraumatic Normocephalic                Eyes - PERRLA, Conjuctiva - clear Sclera- Clear EOMI                Ears - EAC's Wnl TM's Wnl Gross Hearing WNL                Nose - Nares patent                 Throat - oropharanx wnl Respiratory - Lungs CTA bilateral Cardiac - RRR S1 and S2 without murmur GI - Abdomen soft Nontender and bowel sounds active x 4 Extremities - No edema. Neuro - Grossly intact. MS - right ankle with swelling and tenderness over right lateral ankle    xray right ankle - no fracture Prelimnary reading by Penny Saxon [place],FNP Assessment & Plan:  Pain - Plan: HYDROcodone-acetaminophen (NORCO) 5-325 MG per tablet, DG Bone Density, CANCELED: DG Ankle Complete Right  Back pain - Plan: HYDROcodone-acetaminophen (NORCO) 5-325 MG per tablet  Neck pain - Plan: HYDROcodone-acetaminophen (NORCO) 5-325 MG per tablet  Right ankle sprain - Plan: HYDROcodone-acetaminophen (NORCO) 5-325 MG per tablet, DG Bone Density  Lysbeth Penner FNP

## 2013-07-03 ENCOUNTER — Telehealth: Payer: Self-pay | Admitting: Obstetrics and Gynecology

## 2013-07-03 NOTE — Telephone Encounter (Signed)
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 Pt aware.

## 2013-07-24 ENCOUNTER — Encounter: Payer: Self-pay | Admitting: Family Medicine

## 2013-07-24 ENCOUNTER — Ambulatory Visit (INDEPENDENT_AMBULATORY_CARE_PROVIDER_SITE_OTHER): Payer: Medicaid Other | Admitting: Family Medicine

## 2013-07-24 VITALS — BP 112/75 | HR 81 | Temp 98.0°F | Ht 63.0 in | Wt 146.2 lb

## 2013-07-24 DIAGNOSIS — J302 Other seasonal allergic rhinitis: Secondary | ICD-10-CM

## 2013-07-24 DIAGNOSIS — M545 Low back pain, unspecified: Secondary | ICD-10-CM

## 2013-07-24 DIAGNOSIS — M25559 Pain in unspecified hip: Secondary | ICD-10-CM

## 2013-07-24 DIAGNOSIS — J309 Allergic rhinitis, unspecified: Secondary | ICD-10-CM

## 2013-07-24 DIAGNOSIS — M25569 Pain in unspecified knee: Secondary | ICD-10-CM

## 2013-07-24 LAB — POCT CBC
Granulocyte percent: 68 %G (ref 37–80)
HCT, POC: 43.8 % (ref 37.7–47.9)
Hemoglobin: 14.1 g/dL (ref 12.2–16.2)
Lymph, poc: 2.7 (ref 0.6–3.4)
MCH, POC: 31.3 pg — AB (ref 27–31.2)
MCHC: 32.2 g/dL (ref 31.8–35.4)
MCV: 97.2 fL — AB (ref 80–97)
MPV: 6.5 fL (ref 0–99.8)
POC Granulocyte: 6.7 (ref 2–6.9)
POC LYMPH PERCENT: 27.3 %L (ref 10–50)
Platelet Count, POC: 317 10*3/uL (ref 142–424)
RBC: 4.5 M/uL (ref 4.04–5.48)
RDW, POC: 13.1 %
WBC: 9.9 10*3/uL (ref 4.6–10.2)

## 2013-07-24 MED ORDER — CYCLOBENZAPRINE HCL 10 MG PO TABS: 10.0000 mg | ORAL_TABLET | Freq: Three times a day (TID) | ORAL | Status: DC | PRN

## 2013-07-24 MED ORDER — FLUTICASONE PROPIONATE 50 MCG/ACT NA SUSP: 1.0000 | Freq: Every day | NASAL | Status: DC

## 2013-07-24 NOTE — Progress Notes (Signed)
   Subjective:    Patient ID: Penny Moreno, female    DOB: [birth year], 48 y.o.   MRN: [id number]  HPI This 48 y.o. female presents for evaluation of routine follow up.  She is seeing RA and she needs some RA labs drawn per her RA doctor.  She has hx of depression, SAR, and polyarthralgias.  She is here also today to get BMD test at radiology.   Review of Systems No chest pain, SOB, HA, dizziness, vision change, N/V, diarrhea, constipation, dysuria, urinary urgency or frequency, myalgias, arthralgias or rash.     Objective:   Physical Exam  Vital signs noted  Well developed well nourished female.  HEENT - Head atraumatic Normocephalic                Eyes - PERRLA, Conjuctiva - clear Sclera- Clear EOMI                Ears - EAC's Wnl TM's Wnl Gross Hearing WNL                Nose - Nares patent                 Throat - oropharanx wnl Respiratory - Lungs CTA bilateral Cardiac - RRR S1 and S2 without murmur GI - Abdomen soft Nontender and bowel sounds active x 4 Extremities - No edema. Neuro - Grossly intact.      Assessment & Plan:  Seasonal allergies - Plan: fluticasone (FLONASE) 50 MCG/ACT nasal spray  Left-sided low back pain without sciatica - Plan: cyclobenzaprine (FLEXERIL) 10 MG tablet  Chronic arthralgias of knees and hips, unspecified laterality - Plan: POCT CBC, CMP14+EGFR, Lipid panel, Vit D  25 hydroxy (rtn osteoporosis monitoring), Thyroid Panel With TSH, ANA Comprehensive Panel, Antinuclear Antibodies, IFA, Cyclic citrul peptide antibody, IgG, C-reactive protein, HLA-B27 antigen, Rheumatoid factor, Uric acid  Lysbeth Penner FNP

## 2013-07-24 NOTE — Addendum Note (Signed)
Addended by: Selmer Dominion on: 07/24/2013 12:30 PM   Modules accepted: Orders

## 2013-07-26 ENCOUNTER — Other Ambulatory Visit: Payer: Self-pay | Admitting: Family Medicine

## 2013-07-26 LAB — CMP14+EGFR
ALT: 20 IU/L (ref 0–32)
AST: 18 IU/L (ref 0–40)
Albumin/Globulin Ratio: 1.9 (ref 1.1–2.5)
Albumin: 4.4 g/dL (ref 3.5–5.5)
Alkaline Phosphatase: 123 IU/L — ABNORMAL HIGH (ref 39–117)
BUN/Creatinine Ratio: 10 (ref 9–23)
BUN: 7 mg/dL (ref 6–24)
CO2: 22 mmol/L (ref 18–29)
Calcium: 9.2 mg/dL (ref 8.7–10.2)
Chloride: 101 mmol/L (ref 97–108)
Creatinine, Ser: 0.69 mg/dL (ref 0.57–1.00)
GFR calc Af Amer: 121 mL/min/{1.73_m2} (ref >59)
GFR calc non Af Amer: 105 mL/min/{1.73_m2} (ref >59)
Globulin, Total: 2.3 g/dL (ref 1.5–4.5)
Glucose: 106 mg/dL — ABNORMAL HIGH (ref 65–99)
Potassium: 4.8 mmol/L (ref 3.5–5.2)
Sodium: 141 mmol/L (ref 134–144)
Total Bilirubin: <0.2 mg/dL (ref 0.0–1.2)
Total Protein: 6.7 g/dL (ref 6.0–8.5)

## 2013-07-26 LAB — LIPID PANEL
Chol/HDL Ratio: 8.6 ratio units — ABNORMAL HIGH (ref 0.0–4.4)
Cholesterol, Total: 342 mg/dL — ABNORMAL HIGH (ref 100–199)
HDL: 40 mg/dL (ref >39)
Triglycerides: 424 mg/dL — ABNORMAL HIGH (ref 0–149)

## 2013-07-26 LAB — ANA COMPREHENSIVE PANEL
Anti JO-1: <0.2 AI (ref 0.0–0.9)
Centromere Ab Screen: <0.2 AI (ref 0.0–0.9)
Chromatin Ab SerPl-aCnc: <0.2 AI (ref 0.0–0.9)
ENA RNP Ab: <0.2 AI (ref 0.0–0.9)
ENA SM Ab Ser-aCnc: <0.2 AI (ref 0.0–0.9)
ENA SSA (RO) Ab: <0.2 AI (ref 0.0–0.9)
ENA SSB (LA) Ab: <0.2 AI (ref 0.0–0.9)
Scleroderma SCL-70: <0.2 AI (ref 0.0–0.9)
dsDNA Ab: 2 IU/mL (ref 0–9)

## 2013-07-26 LAB — THYROID PANEL WITH TSH
Free Thyroxine Index: 1.4 (ref 1.2–4.9)
T3 Uptake Ratio: 26 % (ref 24–39)
T4, Total: 5.2 ug/dL (ref 4.5–12.0)
TSH: 1.54 u[IU]/mL (ref 0.450–4.500)

## 2013-07-26 LAB — SEDIMENTATION RATE: Sed Rate: 19 mm/hr (ref 0–32)

## 2013-07-26 LAB — ANTINUCLEAR ANTIBODIES, IFA: ANA Titer 1: NEGATIVE

## 2013-07-26 LAB — VITAMIN D 25 HYDROXY (VIT D DEFICIENCY, FRACTURES): Vit D, 25-Hydroxy: 14.7 ng/mL — ABNORMAL LOW (ref 30.0–100.0)

## 2013-07-26 LAB — RHEUMATOID FACTOR: Rhuematoid fact SerPl-aCnc: 21.2 IU/mL — ABNORMAL HIGH (ref 0.0–13.9)

## 2013-07-26 LAB — HLA-B27 ANTIGEN: HLA B27: NEGATIVE

## 2013-07-26 LAB — URIC ACID: Uric Acid: 4.5 mg/dL (ref 2.5–7.1)

## 2013-07-26 LAB — C-REACTIVE PROTEIN: CRP: 2 mg/L (ref 0.0–4.9)

## 2013-07-26 MED ORDER — EZETIMIBE 10 MG PO TABS: 10.0000 mg | ORAL_TABLET | Freq: Every day | ORAL | Status: DC

## 2013-07-26 MED ORDER — VITAMIN D (ERGOCALCIFEROL) 1.25 MG (50000 UNIT) PO CAPS: 50000.0000 [IU] | ORAL_CAPSULE | ORAL | Status: DC

## 2013-07-31 ENCOUNTER — Telehealth: Payer: Self-pay | Admitting: *Deleted

## 2013-07-31 NOTE — Telephone Encounter (Signed)
Patient is intolerant to statins and it should be in my last note. The other option is to rx [place] or colestid

## 2013-07-31 NOTE — Telephone Encounter (Signed)
Bill I'm so sorry  Did not look on the lab report for that info just looked on chart so I totally missed it. Have called [place] [facility] but can't get any thing done until I  Get the formal denial letterin the mail and then I can file an appeal.  Sorry I'll try to do better.

## 2013-07-31 NOTE — Telephone Encounter (Signed)
Ins would not pay for zetia until she has tried and failed two of these atorvastatin, lovastatin, pravastatin or simvastatin.  I can't see where she has tried any., thanks for your help.

## 2013-08-10 NOTE — Telephone Encounter (Signed)
I  Talked with Penny Moreno and she has not gotten the written denial which will have an appeal form attached and at that time I will file an appeal for her zetia. The appeal form only comes to the pt.

## 2013-09-04 ENCOUNTER — Telehealth: Payer: Self-pay | Admitting: Family Medicine

## 2013-09-04 NOTE — Telephone Encounter (Signed)
appt given for Friday with bill

## 2013-09-06 ENCOUNTER — Ambulatory Visit (INDEPENDENT_AMBULATORY_CARE_PROVIDER_SITE_OTHER): Payer: Medicaid Other

## 2013-09-06 ENCOUNTER — Ambulatory Visit (INDEPENDENT_AMBULATORY_CARE_PROVIDER_SITE_OTHER): Payer: Medicaid Other | Admitting: Pharmacist

## 2013-09-06 ENCOUNTER — Encounter: Payer: Self-pay | Admitting: Pharmacist

## 2013-09-06 VITALS — Ht 63.0 in | Wt 146.0 lb

## 2013-09-06 DIAGNOSIS — R52 Pain, unspecified: Secondary | ICD-10-CM

## 2013-09-06 DIAGNOSIS — M899 Disorder of bone, unspecified: Secondary | ICD-10-CM

## 2013-09-06 DIAGNOSIS — S93401A Sprain of unspecified ligament of right ankle, initial encounter: Secondary | ICD-10-CM

## 2013-09-06 DIAGNOSIS — F17200 Nicotine dependence, unspecified, uncomplicated: Secondary | ICD-10-CM

## 2013-09-06 DIAGNOSIS — M8580 Other specified disorders of bone density and structure, unspecified site: Secondary | ICD-10-CM | POA: Insufficient documentation

## 2013-09-06 DIAGNOSIS — M949 Disorder of cartilage, unspecified: Secondary | ICD-10-CM

## 2013-09-06 DIAGNOSIS — Z716 Tobacco abuse counseling: Secondary | ICD-10-CM

## 2013-09-06 DIAGNOSIS — Z7189 Other specified counseling: Secondary | ICD-10-CM

## 2013-09-06 LAB — HM DEXA SCAN

## 2013-09-06 NOTE — Progress Notes (Signed)
Patient ID: Penny Moreno, female   DOB: 16-Nov-1965, 48 y.o.   MRN: [id number] [facility] Current Height: Height: 5\' 3"  (160 cm)      Max Lifetime Height:  5' 3.5 " Current Weight: Weight: 146 lb (66.225 kg)       Ethnicity:Caucasian    HPI: Does pt already have a diagnosis of:  Osteopenia?  No Osteoporosis?  No  Back Pain?  Yes       Kyphosis?  No Prior fracture?  No Med(s) for Osteoporosis/Osteopenia:  none Med(s) previously tried for Osteoporosis/Osteopenia:  none                                                             PMH: Age at menopause:  48yo Hysterectomy?  No Oophorectomy?  No HRT? No Steroid Use?  No Thyroid med?  No History of cancer?  No History of digestive disorders (ie Crohn's)?  No Current or previous eating disorders?  Yes - anorexia in past Last Vitamin D Result:  14.7 (07/24/2013) Last GFR Result:  105 (07/24/2013)   FH/SH: Family history of osteoporosis?  No Parent with history of hip fracture?  No Family history of breast cancer?  Yes - maternal grandmother Exercise?  Yes - abou 1-2 times per week Smoking?  Yes - she is trying to decrease and quit Alcohol?  No    Calcium Assessment Calcium Intake  # of servings/day  Calcium mg  Milk (8 oz) - almond 1  x  450  = 450mg   Yogurt (4 oz) 0 x  200 = 0  Cheese (1 oz) 1 x  200 = 200mg   Other Calcium sources   250mg   Ca supplement 0 = 0   Estimated calcium intake per day 900mg     DEXA Results Date of Test T-Score for [facility] Spine L1-L4 T-Score for Total Left Hip T-Score for Total Right Hip  09/06/2013 0.1 -0.1 -0.4       T-Score for neck of  Right hip = -1.1          FRAX 10 year estimate: Total FX risk:  3.1%  (consider medication if >/= 20%) Hip FX risk:  0.2%  (consider medication if >/= 3%)  Assessment: Osteopenia of hip Vitamin D Deficiency Tobacco Abuse  Recommendations: 1.  Discussed DEXA results and fracture risk.  2.  recommend calcium 1200mg  daily through  supplementation or diet.  Patient to increase green leafy vegetables to increase calcium 3.  recommend weight bearing exercise - 30 minutes at least 4 days per week.   4.  Counseled and educated about fall risk and prevention. 5.  Discussed smoking cessation and tips for quitting smoking.  No medication therapy wanted at this time.  Follow up with PCP in 2 months  Recheck DEXA:  2 years  Time spent counseling patient:  25 minutes  Cherre Robins, PharmD, CPP

## 2013-09-06 NOTE — Patient Instructions (Signed)
Fall Prevention and Home Safety Falls cause injuries and can affect all age groups. It is possible to use preventive measures to significantly decrease the likelihood of falls. There are many simple measures which can make your home safer and prevent falls. OUTDOORS  Repair cracks and edges of walkways and driveways.  Remove high doorway thresholds.  Trim shrubbery on the main path into your home.  Have good outside lighting.  Clear walkways of tools, rocks, debris, and clutter.  Check that handrails are not broken and are securely fastened. Both sides of steps should have handrails.  Have leaves, snow, and ice cleared regularly.  Use sand or salt on walkways during winter months.  In the garage, clean up grease or oil spills. BATHROOM  Install night lights.  Install grab bars by the toilet and in the tub and shower.  Use non-skid mats or decals in the tub or shower.  Place a plastic non-slip stool in the shower to sit on, if needed.  Keep floors dry and clean up all water on the floor immediately.  Remove soap buildup in the tub or shower on a regular basis.  Secure bath mats with non-slip, double-sided rug tape.  Remove throw rugs and tripping hazards from the floors. BEDROOMS  Install night lights.  Make sure a bedside light is easy to reach.  Do not use oversized bedding.  Keep a telephone by your bedside.  Have a firm chair with side arms to use for getting dressed.  Remove throw rugs and tripping hazards from the floor. KITCHEN  Keep handles on pots and pans turned toward the center of the stove. Use back burners when possible.  Clean up spills quickly and allow time for drying.  Avoid walking on wet floors.  Avoid hot utensils and knives.  Position shelves so they are not too high or low.  Place commonly used objects within easy reach.  If necessary, use a sturdy step stool with a grab bar when reaching.  Keep electrical cables out of the  way.  Do not use floor polish or wax that makes floors slippery. If you must use wax, use non-skid floor wax.  Remove throw rugs and tripping hazards from the floor. STAIRWAYS  Never leave objects on stairs.  Place handrails on both sides of stairways and use them. Fix any loose handrails. Make sure handrails on both sides of the stairways are as long as the stairs.  Check carpeting to make sure it is firmly attached along stairs. Make repairs to worn or loose carpet promptly.  Avoid placing throw rugs at the top or bottom of stairways, or properly secure the rug with carpet tape to prevent slippage. Get rid of throw rugs, if possible.  Have an electrician put in a light switch at the top and bottom of the stairs. OTHER FALL PREVENTION TIPS  Wear low-heel or rubber-soled shoes that are supportive and fit well. Wear closed toe shoes.  When using a stepladder, make sure it is fully opened and both spreaders are firmly locked. Do not climb a closed stepladder.  Add color or contrast paint or tape to grab bars and handrails in your home. Place contrasting color strips on first and last steps.  Learn and use mobility aids as needed. Install an electrical emergency response system.  Turn on lights to avoid dark areas. Replace light bulbs that burn out immediately. Get light switches that glow.  Arrange furniture to create clear pathways. Keep furniture in the same place.    Firmly attach carpet with non-skid or double-sided tape.  Eliminate uneven floor surfaces.  Select a carpet pattern that does not visually hide the edge of steps.  Be aware of all pets. OTHER HOME SAFETY TIPS  Set the water temperature for 120 F (48.8 C).  Keep emergency numbers on or near the telephone.  Keep smoke detectors on every level of the home and near sleeping areas. Document Released: 01/09/2002 Document Revised: 07/21/2011 Document Reviewed: 04/10/2011 Exit[employer] Patient Information 2015  [employer]. This information is not intended to replace advice given to you by your health care provider. Make sure you discuss any questions you have with your health care provider.                Exercise for Strong Bones  Exercise is important to build and maintain strong bones / bone density.  There are 2 types of exercises that are important to building and maintaining strong bones:  Weight- bearing and muscle-stregthening.  Weight-bearing Exercises  These exercises include activities that make you move against gravity while staying upright. Weight-bearing exercises can be high-impact or low-impact.  High-impact weight-bearing exercises help build bones and keep them strong. If you have broken a bone due to osteoporosis or are at risk of breaking a bone, you may need to avoid high-impact exercises. If you're not sure, you should check with your healthcare provider.  Examples of high-impact weight-bearing exercises are: Dancing  Doing high-impact aerobics  Hiking  Jogging/running  Jumping Rope  Stair climbing  Tennis  Low-impact weight-bearing exercises can also help keep bones strong and are a safe alternative if you cannot do high-impact exercises.   Examples of low-impact weight-bearing exercises are: Using elliptical training machines  Doing low-impact aerobics  Using stair-step machines  Fast walking on a treadmill or outside   Muscle-Strengthening Exercises These exercises include activities where you move your body, a weight or some other resistance against gravity. They are also known as resistance exercises and include: Lifting weights  Using elastic exercise bands  Using weight machines  Lifting your own body weight  Functional movements, such as standing and rising up on your toes  Yoga and Pilates can also improve strength, balance and flexibility. However, certain positions may not be safe for people with osteoporosis or those at increased risk of broken  bones. For example, exercises that have you bend forward may increase the chance of breaking a bone in the spine.   Non-Impact Exercises There are other types of exercises that can help prevent falls.  Non-impact exercises can help you to improve balance, posture and how well you move in everyday activities. Some of these exercises include: Balance exercises that strengthen your legs and test your balance, such as Tai Chi, can decrease your risk of falls.  Posture exercises that improve your posture and reduce rounded or "sloping" shoulders can help you decrease the chance of breaking a bone, especially in the spine.  Functional exercises that improve how well you move can help you with everyday activities and decrease your chance of falling and breaking a bone. For example, if you have trouble getting up from a chair or climbing stairs, you should do these activities as exercises.   **A physical therapist can teach you balance, posture and functional exercises. He/she can also help you learn which exercises are safe and appropriate for you.  [facility] has a physical therapy office in [place] in front of our office and referrals can be made for assessments   and treatment as needed and strength and balance training.  If you would like to have an assessment with Chad and our physical therapy team please let a nurse or provider know.    

## 2013-09-08 ENCOUNTER — Ambulatory Visit (INDEPENDENT_AMBULATORY_CARE_PROVIDER_SITE_OTHER): Payer: Medicaid Other | Admitting: Family Medicine

## 2013-09-08 ENCOUNTER — Encounter: Payer: Self-pay | Admitting: Family Medicine

## 2013-09-08 VITALS — BP 121/79 | HR 83 | Temp 96.4°F | Ht 63.0 in | Wt 146.0 lb

## 2013-09-08 DIAGNOSIS — J069 Acute upper respiratory infection, unspecified: Secondary | ICD-10-CM

## 2013-09-08 DIAGNOSIS — K115 Sialolithiasis: Secondary | ICD-10-CM

## 2013-09-08 MED ORDER — AZITHROMYCIN 250 MG PO TABS: ORAL_TABLET | ORAL | Status: DC

## 2013-09-08 MED ORDER — METHYLPREDNISOLONE ACETATE 80 MG/ML IJ SUSP
80.0000 mg | Freq: Once | INTRAMUSCULAR | Status: AC
  Administered 2013-09-08: 80 mg via INTRAMUSCULAR

## 2013-09-08 NOTE — Progress Notes (Signed)
   Subjective:    Patient ID: Penny Moreno, female    DOB: 27-Nov-1965, 48 y.o.   MRN: [id number]  HPI C/o salivary gland on the left submandibular area is swollen and "blocked"  She has been having URI sx's and she is having some sore throat.   Review of Systems C/o blocked salivary gland and uri sx's No chest pain, SOB, HA, dizziness, vision change, N/V, diarrhea, constipation, dysuria, urinary urgency or frequency, myalgias, arthralgias or rash.     Objective:   Physical Exam   Vital signs noted  Well developed well nourished female.  HEENT - Head atraumatic Normocephalic                Eyes - PERRLA, Conjuctiva - clear Sclera- Clear EOMI                Ears - EAC's Wnl TM's Wnl Gross Hearing WNL                Nose - Nares patent                 Throat - oropharanx wnl and swollen submandibular salivary gland Respiratory - Lungs CTA bilateral Cardiac - RRR S1 and S2 without murmur GI - Abdomen soft Nontender and bowel sounds active x 4 Extremities - No edema. Neuro - Grossly intact.     Assessment & Plan:  URI (upper respiratory infection) - Plan: azithromycin (ZITHROMAX) 250 MG tablet, methylPREDNISolone acetate (DEPO-MEDROL) injection 80 mg  Sialolithiasis of submandibular gland - Plan: azithromycin (ZITHROMAX) 250 MG tablet, methylPREDNISolone acetate (DEPO-MEDROL) injection 80 mg Explained getting some lemons and squeezing and taking a couple shots of lemon juice a day For next few days and if not clearing up then may need to see ENT.  Lysbeth Penner FNP

## 2013-09-20 ENCOUNTER — Telehealth: Payer: Self-pay | Admitting: *Deleted

## 2013-09-20 NOTE — Telephone Encounter (Signed)
Bill, I resubmitted Daylan's Zetia to [place] [employer] and it was approved.  Sorry I fouled this one up to start with.

## 2013-09-20 NOTE — Telephone Encounter (Signed)
Re-submitted this claim and it was covered.

## 2013-09-26 ENCOUNTER — Encounter: Payer: Self-pay | Admitting: Family Medicine

## 2013-09-26 ENCOUNTER — Ambulatory Visit (INDEPENDENT_AMBULATORY_CARE_PROVIDER_SITE_OTHER): Payer: Medicaid Other | Admitting: Family Medicine

## 2013-09-26 VITALS — BP 106/64 | HR 82 | Temp 98.2°F | Ht 63.0 in | Wt 143.4 lb

## 2013-09-26 DIAGNOSIS — J0110 Acute frontal sinusitis, unspecified: Secondary | ICD-10-CM

## 2013-09-26 MED ORDER — MONTELUKAST SODIUM 10 MG PO TABS: 10.0000 mg | ORAL_TABLET | Freq: Every day | ORAL | Status: DC

## 2013-09-26 MED ORDER — AMOXICILLIN 875 MG PO TABS: 875.0000 mg | ORAL_TABLET | Freq: Two times a day (BID) | ORAL | Status: DC

## 2013-09-26 MED ORDER — METHYLPREDNISOLONE ACETATE 80 MG/ML IJ SUSP
80.0000 mg | Freq: Once | INTRAMUSCULAR | Status: AC
  Administered 2013-09-26: 80 mg via INTRAMUSCULAR

## 2013-09-26 NOTE — Progress Notes (Signed)
   Subjective:    Patient ID: Liliana Cline, female    DOB: 08-14-1965, 48 y.o.   MRN: [id number]  HPI This 48 y.o. female presents for evaluation of sinus pressure and mucopurulent sinus drainage.   Review of Systems No chest pain, SOB, HA, dizziness, vision change, N/V, diarrhea, constipation, dysuria, urinary urgency or frequency, myalgias, arthralgias or rash.     Objective:   Physical Exam  Vital signs noted  Well developed well nourished female.  HEENT - Head atraumatic Normocephalic                Eyes - PERRLA, Conjuctiva - clear Sclera- Clear EOMI                Ears - EAC's Wnl TM's Wnl Gross Hearing WNL                Nose - Nares patent                 Throat - oropharanx wnl Respiratory - Lungs CTA bilateral Cardiac - RRR S1 and S2 without murmur GI - Abdomen soft Nontender and bowel sounds active x 4 Extremities - No edema. Neuro - Grossly intact.      Assessment & Plan:  Subacute frontal sinusitis - Plan: montelukast (SINGULAIR) 10 MG tablet, amoxicillin (AMOXIL) 875 MG tablet, methylPREDNISolone acetate (DEPO-MEDROL) injection 80 mg  Lysbeth Penner FNP

## 2013-11-29 ENCOUNTER — Telehealth: Payer: Self-pay | Admitting: Family Medicine

## 2013-11-29 NOTE — Telephone Encounter (Signed)
appt given per patient request 

## 2013-12-01 ENCOUNTER — Ambulatory Visit (INDEPENDENT_AMBULATORY_CARE_PROVIDER_SITE_OTHER): Payer: Medicaid Other | Admitting: Family Medicine

## 2013-12-01 ENCOUNTER — Encounter: Payer: Self-pay | Admitting: Family Medicine

## 2013-12-01 ENCOUNTER — Other Ambulatory Visit: Payer: Self-pay | Admitting: Family Medicine

## 2013-12-01 VITALS — BP 113/64 | HR 98 | Temp 97.6°F | Ht 63.0 in | Wt 144.8 lb

## 2013-12-01 DIAGNOSIS — M545 Low back pain, unspecified: Secondary | ICD-10-CM

## 2013-12-01 DIAGNOSIS — E785 Hyperlipidemia, unspecified: Secondary | ICD-10-CM

## 2013-12-01 DIAGNOSIS — Z23 Encounter for immunization: Secondary | ICD-10-CM

## 2013-12-01 DIAGNOSIS — E559 Vitamin D deficiency, unspecified: Secondary | ICD-10-CM

## 2013-12-01 DIAGNOSIS — K1370 Unspecified lesions of oral mucosa: Secondary | ICD-10-CM

## 2013-12-01 MED ORDER — CYCLOBENZAPRINE HCL 10 MG PO TABS: 10.0000 mg | ORAL_TABLET | Freq: Three times a day (TID) | ORAL | Status: DC | PRN

## 2013-12-01 NOTE — Progress Notes (Signed)
   Subjective:    Patient ID: Penny Moreno, female    DOB: 04-11-1965, 48 y.o.   MRN: [id number]  HPI sHE HAS persistent oral lesion at base of tongue.  She need vitaminD and FLP  Review of Systems  Constitutional: Negative for fever.  HENT: Negative for ear pain.        Oral lesion  Eyes: Negative for discharge.  Respiratory: Negative for cough.   Cardiovascular: Negative for chest pain.  Gastrointestinal: Negative for abdominal distention.  Endocrine: Negative for polyuria.  Genitourinary: Negative for difficulty urinating.  Musculoskeletal: Negative for gait problem and neck pain.  Skin: Negative for color change and rash.  Neurological: Negative for speech difficulty and headaches.  Psychiatric/Behavioral: Negative for agitation.       Objective:    BP 113/64  Pulse 98  Temp(Src) 97.6 F (36.4 C) (Oral)  Ht 5\' 3"  (1.6 m)  Wt 144 lb 12.8 oz (65.681 kg)  BMI 25.66 kg/m2  LMP 09/20/2011 Physical Exam  Constitutional: She is oriented to person, place, and time. She appears well-developed and well-nourished.  HENT:  Head: Normocephalic and atraumatic.  Mouth/Throat: Oropharynx is clear and moist.  Base of tongue with dark salivary gland that is edematous and tender.  Eyes: Pupils are equal, round, and reactive to light.  Neck: Normal range of motion. Neck supple.  Cardiovascular: Normal rate and regular rhythm.   No murmur heard. Pulmonary/Chest: Effort normal and breath sounds normal.  Abdominal: Soft. Bowel sounds are normal. There is no tenderness.  Neurological: She is alert and oriented to person, place, and time.  Skin: Skin is warm and dry.  Psychiatric: She has a normal mood and affect.          Assessment & Plan:   Oral lesion - Plan: Ambulatory referral to ENT  Hyperlipemia - Plan: Lipid panel  Vitamin D deficiency - Plan: Vitamin D, 25-hydroxy   Return in about 3 months (around 03/03/2014).  Lysbeth Penner FNP

## 2013-12-02 LAB — LIPID PANEL
Chol/HDL Ratio: 8.1 ratio units — ABNORMAL HIGH (ref 0.0–4.4)
Cholesterol, Total: 325 mg/dL — ABNORMAL HIGH (ref 100–199)
HDL: 40 mg/dL (ref >39)
LDL Calculated: 206 mg/dL — ABNORMAL HIGH (ref 0–99)
Triglycerides: 395 mg/dL — ABNORMAL HIGH (ref 0–149)
VLDL Cholesterol Cal: 79 mg/dL — ABNORMAL HIGH (ref 5–40)

## 2013-12-02 LAB — VITAMIN D 25 HYDROXY (VIT D DEFICIENCY, FRACTURES): Vit D, 25-Hydroxy: 33.3 ng/mL (ref 30.0–100.0)

## 2013-12-04 ENCOUNTER — Encounter: Payer: Self-pay | Admitting: Family Medicine

## 2013-12-04 ENCOUNTER — Other Ambulatory Visit: Payer: Self-pay | Admitting: Family Medicine

## 2013-12-04 MED ORDER — PITAVASTATIN CALCIUM 2 MG PO TABS: 2.0000 mg | ORAL_TABLET | ORAL | Status: DC

## 2014-02-20 ENCOUNTER — Telehealth: Payer: Self-pay | Admitting: Family Medicine

## 2014-03-25 ENCOUNTER — Other Ambulatory Visit: Payer: Self-pay | Admitting: Family Medicine

## 2014-03-30 ENCOUNTER — Encounter: Payer: Self-pay | Admitting: Nurse Practitioner

## 2014-03-30 ENCOUNTER — Ambulatory Visit (INDEPENDENT_AMBULATORY_CARE_PROVIDER_SITE_OTHER): Payer: Medicaid Other | Admitting: Nurse Practitioner

## 2014-03-30 VITALS — BP 118/72 | HR 92 | Temp 97.3°F | Ht 63.0 in | Wt 150.0 lb

## 2014-03-30 DIAGNOSIS — J209 Acute bronchitis, unspecified: Secondary | ICD-10-CM

## 2014-03-30 MED ORDER — HYDROCODONE-HOMATROPINE 5-1.5 MG/5ML PO SYRP: 5.0000 mL | ORAL_SOLUTION | Freq: Four times a day (QID) | ORAL | Status: DC | PRN

## 2014-03-30 MED ORDER — AMOXICILLIN 875 MG PO TABS: 875.0000 mg | ORAL_TABLET | Freq: Two times a day (BID) | ORAL | Status: DC

## 2014-03-30 NOTE — Progress Notes (Signed)
  Subjective:     Penny Moreno is a 49 y.o. female here for evaluation of a cough. Onset of symptoms was 1 week ago. Symptoms have been gradually worsening since that time. The cough is hoarse and productive and is aggravated by reclining position. Associated symptoms include: change in voice, postnasal drip and sputum production. Patient does not have a history of asthma. Patient does not have a history of environmental allergens. Patient has not traveled recently. Patient does have a history of smoking. Patient has not had a previous chest x-ray. Patient has not had a PPD done.  The following portions of the patient's history were reviewed and updated as appropriate: allergies, current medications, past family history, past medical history, past social history, past surgical history and problem list.  Review of Systems Pertinent items are noted in HPI.    Objective:     BP 118/72 mmHg  Pulse 92  Temp(Src) 97.3 F (36.3 C) (Oral)  Ht 5\' 3"  (1.6 m)  Wt 150 lb (68.04 kg)  BMI 26.58 kg/m2  LMP 09/20/2011 General appearance: alert and cooperative Eyes: conjunctivae/corneas clear. PERRL, EOM's intact. Fundi benign. Ears: normal TM's and external ear canals both ears Nose: Nares normal. Septum midline. Mucosa normal. No drainage or sinus tenderness., mild congestion, turbinates red Throat: lips, mucosa, and tongue normal; teeth and gums normal Neck: no adenopathy, no carotid bruit, no JVD, supple, symmetrical, trachea midline and thyroid not enlarged, symmetric, no tenderness/mass/nodules Lungs: rhonchi bilaterally Heart: regular rate and rhythm, S1, S2 normal, no murmur, click, rub or gallop    Assessment:    Acute Bronchitis    Plan:   1. Take meds as prescribed 2. Use a cool mist humidifier especially during the winter months and when heat has been humid. 3. Use saline nose sprays frequently 4. Saline irrigations of the nose can be very helpful if done frequently.  * 4X  daily for 1 week*  * Use of a nettie pot can be helpful with this. Follow directions with this* 5. Drink plenty of fluids 6. Keep thermostat turn down low 7.For any cough or congestion  Use plain Mucinex- regular strength or max strength is fine   * Children- consult with Pharmacist for dosing 8. For fever or aces or pains- take tylenol or ibuprofen appropriate for age and weight.  * for fevers greater than 101 orally you may alternate ibuprofen and tylenol every  3 hours.   Meds ordered this encounter  Medications  . amoxicillin (AMOXIL) 875 MG tablet    Sig: Take 1 tablet (875 mg total) by mouth 2 (two) times daily. 1 po BID    Dispense:  20 tablet    Refill:  0    Order Specific Question:  Supervising Provider    Answer:  Chipper Herb [[id number]]  . HYDROcodone-homatropine (HYCODAN) 5-1.5 MG/5ML syrup    Sig: Take 5 mLs by mouth every 6 (six) hours as needed for cough.    Dispense:  120 mL    Refill:  0    Order Specific Question:  Supervising Provider    Answer:  Chipper Herb [[id number]]   Mary-Margaret Hassell Done, FNP

## 2014-03-30 NOTE — Patient Instructions (Signed)

## 2014-04-30 ENCOUNTER — Ambulatory Visit (INDEPENDENT_AMBULATORY_CARE_PROVIDER_SITE_OTHER): Payer: Medicaid Other | Admitting: Obstetrics and Gynecology

## 2014-04-30 ENCOUNTER — Other Ambulatory Visit (HOSPITAL_COMMUNITY)
Admission: RE | Admit: 2014-04-30 | Discharge: 2014-04-30 | Disposition: A | Discharge status: Home / Self Care | Payer: Medicaid Other | Source: Ambulatory Visit | Attending: Obstetrics and Gynecology | Admitting: Obstetrics and Gynecology

## 2014-04-30 ENCOUNTER — Encounter: Payer: Self-pay | Admitting: Obstetrics and Gynecology

## 2014-04-30 VITALS — BP 110/70 | Ht 63.0 in | Wt 147.0 lb

## 2014-04-30 DIAGNOSIS — Z0000 Encounter for general adult medical examination without abnormal findings: Secondary | ICD-10-CM | POA: Diagnosis not present

## 2014-04-30 DIAGNOSIS — Z01419 Encounter for gynecological examination (general) (routine) without abnormal findings: Secondary | ICD-10-CM

## 2014-04-30 DIAGNOSIS — Z01411 Encounter for gynecological examination (general) (routine) with abnormal findings: Secondary | ICD-10-CM | POA: Diagnosis not present

## 2014-04-30 DIAGNOSIS — R87810 Cervical high risk human papillomavirus (HPV) DNA test positive: Secondary | ICD-10-CM | POA: Diagnosis present

## 2014-04-30 DIAGNOSIS — Z1151 Encounter for screening for human papillomavirus (HPV): Secondary | ICD-10-CM | POA: Insufficient documentation

## 2014-04-30 NOTE — Progress Notes (Signed)
Patient ID: Penny Moreno, female   DOB: 1965-08-26, 49 y.o.   MRN: [id number] Pt here today for annual exam. Pt denies any problems or concerns at this time.

## 2014-04-30 NOTE — Progress Notes (Signed)
Patient ID: Penny Moreno, female   DOB: 1965-09-02, 49 y.o.   MRN: [id number]  This chart was SCRIBED for Penny Shirk, MD by Stephania Fragmin, ED Scribe. This patient was seen in room 1 and the patient's care was started at 11:28 AM.   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   Plan:  1. pap smear done, next pap due 1 year 2. return annually or prn 3    Annual mammogram advised Subjective:  [name] is a 49 y.o. female G2P2002 who presents for annual exam. Patient's last menstrual period was 09/20/2011. The patient has complaints today of a non-tender "bump" on her left labia majora. She has no other complaints at this time.    Patient's PCP is Dr. Dietrich Pates at [facility], but he has since moved and she has no PCP. Patient states she is taking Zetia and Livalo for hyperlipidemia, and low-dose Neurontin, Buspar, and Rexulti for bipolar disorder.   The following portions of the patient's history were reviewed and updated as appropriate: allergies, current medications, past family history, past medical history, past social history, past surgical history and problem list. Past Medical History  Diagnosis Date  . Anxiety   . Depression   . Arthritis   . OCD (obsessive compulsive disorder)   . Eating disorder   . Panic disorder   . Neuromuscular disorder     Fibromyalgia  . Bipolar disorder   . Vaginal Pap smear, abnormal   . GERD (gastroesophageal reflux disease)     Past Surgical History  Procedure Laterality Date  . Diagnostic laparoscopy      [place]  . Tonsillectomy    . Laparoscopic tubal ligation  10/30/2011    Procedure: LAPAROSCOPIC TUBAL LIGATION;  Surgeon: Jonnie Kind, MD;  Location: [facility];  Service: Gynecology;  Laterality: Bilateral;  Laparoscopic Bilateral tubal ligation with falope rings  . Tubal ligation  10/04/2011     Current outpatient prescriptions:  .  ALPRAZolam (XANAX) 0.5 MG tablet,  Take 1 tablet (0.5 mg total) by mouth daily as needed. For anxiety, Disp: 30 tablet, Rfl: 0 .  Ascorbic Acid (VITAMIN C) 100 MG tablet, Take 100 mg by mouth daily., Disp: , Rfl:  .  BETA CAROTENE PO, Take by mouth daily., Disp: , Rfl:  .  BIOTIN PO, Take by mouth daily., Disp: , Rfl:  .  Brexpiprazole (REXULTI PO), Take by mouth daily., Disp: , Rfl:  .  busPIRone (BUSPAR) 15 MG tablet, Take 15 mg by mouth., Disp: , Rfl:  .  cyclobenzaprine (FLEXERIL) 10 MG tablet, Take 1 tablet (10 mg total) by mouth 3 (three) times daily as needed for muscle spasms., Disp: 30 tablet, Rfl: 6 .  ezetimibe (ZETIA) 10 MG tablet, Take 10 mg by mouth., Disp: , Rfl:  .  FLUoxetine HCl (PROZAC PO), Take 60 mg by mouth at bedtime., Disp: , Rfl:  .  gabapentin (NEURONTIN) 300 MG capsule, Take 300 mg by mouth at bedtime., Disp: , Rfl:  .  omeprazole (PRILOSEC) 20 MG capsule, Take 20 mg by mouth daily., Disp: , Rfl:  .  Pitavastatin Calcium (LIVALO) 2 MG TABS, Take 1 tablet (2 mg total) by mouth 1 day or 1 dose., Disp: 30 tablet, Rfl: 11 .  Probiotic Product (PROBIOTIC PEARLS PO), Take 1 capsule by mouth daily., Disp: , Rfl:  .  Vitamin D, Ergocalciferol, (DRISDOL) 50000 UNITS CAPS capsule, Take 1 capsule (50,000  Units total) by mouth every 7 (seven) days., Disp: 18 capsule, Rfl: 0 .  vitamin E 100 UNIT capsule, Take 100 Units by mouth daily., Disp: , Rfl:  .  Wheat Dextrin (BENEFIBER) POWD, Take 2 each by mouth daily., Disp: , Rfl:   Review of Systems Constitutional: negative insomnia due to new bipolar med, rixalti. Gastrointestinal: negative  Genitourinary: negative Rectal: negative A complete review of systems was obtained and all systems are negative except as noted in the HPI and PMH.    Objective:  BP 110/70 mmHg  Ht 5\' 3"  (1.6 m)  Wt 147 lb (66.679 kg)  BMI 26.05 kg/m2  LMP 09/20/2011   BMI: Body mass index is 26.05 kg/(m^2).  General Appearance: Alert, appropriate appearance for age. No acute  distress HEENT: Grossly normal Neck / Thyroid:  Cardiovascular: RRR; normal S1, S2, no murmur Lungs: CTA bilaterally Back: No CVAT Breast Exam: No masses or nodes.No dimpling, nipple retraction or discharge. Gastrointestinal: Soft, non-tender, no masses or organomegaly Pelvic Exam: Vulva and vagina appear normal. Bimanual exam reveals normal uterus and adnexa. External genitalia: normal general appearance; small benign appearing skin tag on left labia majora, 4 mm in diameter. No suspicious character, edge smooth Urinary system: urethral meatus normal Vaginal: normal mucosa without prolapse or lesions Cervix: multiparous, normal appearance Adnexa: normal bimanual exam Uterus: Uterus is somewhat tiny; normal sensitivity to contact Rectal: good sphincter tone; no rectocele; Guaiac test is negative Rectovaginal: n/a Lymphatic Exam: Non-palpable nodes in neck, clavicular, axillary, or inguinal regions  Skin: no rash or abnormalities Neurologic: Normal gait and speech, no tremor  Psychiatric: Alert and oriented, appropriate affect. Urinalysis: Not done  Penny Moreno. MD Pgr [phone] 11:28 AM   I personally performed the services described in this documentation, which was SCRIBED in my presence. The recorded information has been reviewed and considered accurate. It has been edited as necessary during review. Jonnie Kind, MD

## 2014-05-01 LAB — CYTOLOGY - PAP

## 2014-05-09 ENCOUNTER — Telehealth: Payer: Self-pay | Admitting: *Deleted

## 2014-05-09 NOTE — Telephone Encounter (Signed)
-----   Message from Jonnie Kind, MD sent at 05/02/2014  8:57 PM EDT ----- Pap shows minimal abnormalities, ASCUS, with the presence of HPV virus.  Will recommend assessment by colposcopy.

## 2014-05-09 NOTE — Telephone Encounter (Signed)
Pt aware of results and aware that she would need a colposcopy. Procedure was explained. Phone call switched to front office and appointment was made.

## 2014-05-24 ENCOUNTER — Encounter: Payer: Medicaid Other | Admitting: Obstetrics and Gynecology

## 2014-06-03 ENCOUNTER — Other Ambulatory Visit: Payer: Self-pay | Admitting: Family Medicine

## 2014-06-13 ENCOUNTER — Telehealth: Payer: Self-pay | Admitting: *Deleted

## 2014-06-13 NOTE — Telephone Encounter (Signed)
Spoke with Pt in regards to abnormal pap results from 04/30/2014 and need for a colposcopy to be scheduled. Pt states did speak with Dr. Glo Herring and discussed results but had to cancel appt for the colposcopy. Call transferred to front staff for pt to r/s an appt for the colposcopy procedure.

## 2014-07-04 ENCOUNTER — Ambulatory Visit (INDEPENDENT_AMBULATORY_CARE_PROVIDER_SITE_OTHER): Payer: Medicaid Other | Admitting: Obstetrics and Gynecology

## 2014-07-04 ENCOUNTER — Other Ambulatory Visit: Payer: Self-pay | Admitting: Obstetrics and Gynecology

## 2014-07-04 ENCOUNTER — Encounter: Payer: Self-pay | Admitting: Obstetrics and Gynecology

## 2014-07-04 VITALS — BP 100/68 | HR 96 | Ht 63.0 in | Wt 148.0 lb

## 2014-07-04 DIAGNOSIS — N870 Mild cervical dysplasia: Secondary | ICD-10-CM

## 2014-07-04 DIAGNOSIS — R87612 Low grade squamous intraepithelial lesion on cytologic smear of cervix (LGSIL): Secondary | ICD-10-CM | POA: Insufficient documentation

## 2014-07-04 DIAGNOSIS — N871 Moderate cervical dysplasia: Secondary | ICD-10-CM | POA: Diagnosis not present

## 2014-07-04 NOTE — Progress Notes (Signed)
Patient ID: Penny Moreno, female   DOB: 1965/12/31, 49 y.o.   MRN: [id number]    Penny Moreno 49 y.o. [id number] here for colposcopy for ASCUS with POSITIVE high risk HPV pap smear on 04/30/14.  Discussed role for HPV in cervical dysplasia, need for surveillance.  ROS: Sciatica and hip pain. We will give Motrin.    PHYSICAL EXAM: Pelvic - External genitalia: a small area of melanosis.  Cervix: multiparous, with adequate colposcopy    Patient given informed consent, signed copy in the chart, time out was performed.  Placed in lithotomy position. Cervix viewed with speculum and colposcope after application of acetic acid.    Colposcopy adequate? Yes  no visible lesions, no mosaicism, no punctation and no abnormal vasculature; biopsies obtained at 11 o'clock, anterior lip.   ECC specimen obtained. All specimens were labelled and sent to pathology.   Assessment/Colposcopy IMPRESSION: no visible dysplasia F/u ECC F/u  Cx BX ant lip(11oclock) Patient was given post procedure instructions. Will follow up pathology and manage accordingly.  Routine preventative health maintenance measures emphasized.  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 Follow-up in 1 year or prn.for abnormalitiy  Of bx   This chart was SCRIBED for [name], MD by Stephania Fragmin, ED Scribe. This patient was seen in room 2 and the patient's care was started at 2:20 PM.  I personally performed the services described in this documentation, which was SCRIBED in my presence. The recorded information has been reviewed and considered accurate. It has been edited as necessary during review. Jonnie Kind, MD

## 2014-07-09 ENCOUNTER — Telehealth: Payer: Self-pay | Admitting: *Deleted

## 2014-07-09 NOTE — Telephone Encounter (Signed)
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 NO CANCER. WILL ADVISE PT TO HAVE LEEP IN OFFICE

## 2014-07-09 NOTE — Telephone Encounter (Signed)
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 Pt informed of Dr. Glo Herring recommendation for a LEEP procedure due to results. Procedure discussed, all questions answered, call transferred to front staff for appt to be scheduled for LEEP.

## 2014-07-13 ENCOUNTER — Encounter: Payer: Medicaid Other | Admitting: Obstetrics and Gynecology

## 2014-07-23 ENCOUNTER — Encounter: Payer: Medicaid Other | Admitting: Obstetrics and Gynecology

## 2014-07-31 ENCOUNTER — Encounter: Payer: Medicaid Other | Admitting: Obstetrics and Gynecology

## 2014-08-02 ENCOUNTER — Ambulatory Visit (INDEPENDENT_AMBULATORY_CARE_PROVIDER_SITE_OTHER): Payer: Medicaid Other | Admitting: Obstetrics and Gynecology

## 2014-08-02 ENCOUNTER — Telehealth: Payer: Self-pay | Admitting: Obstetrics and Gynecology

## 2014-08-02 ENCOUNTER — Encounter: Payer: Self-pay | Admitting: Obstetrics and Gynecology

## 2014-08-02 VITALS — BP 120/86 | HR 80 | Ht 63.0 in | Wt 148.0 lb

## 2014-08-21 NOTE — Progress Notes (Signed)
Patient ID: Penny Moreno, female   DOB: [birth year], 49 y.o.   MRN: [id number] No show for appt

## 2014-09-17 ENCOUNTER — Encounter: Payer: Medicaid Other | Admitting: Obstetrics and Gynecology

## 2014-09-20 ENCOUNTER — Encounter: Payer: Self-pay | Admitting: Family

## 2014-09-20 ENCOUNTER — Ambulatory Visit (INDEPENDENT_AMBULATORY_CARE_PROVIDER_SITE_OTHER): Payer: Medicaid Other | Admitting: Family

## 2014-09-20 VITALS — BP 118/79 | HR 83 | Temp 97.4°F | Ht 63.0 in | Wt 149.0 lb

## 2014-09-20 DIAGNOSIS — M5441 Lumbago with sciatica, right side: Secondary | ICD-10-CM

## 2014-09-20 MED ORDER — METHYLPREDNISOLONE 4 MG PO TBPK: ORAL_TABLET | ORAL | Status: DC

## 2014-09-20 MED ORDER — METHYLPREDNISOLONE ACETATE 80 MG/ML IJ SUSP
80.0000 mg | Freq: Once | INTRAMUSCULAR | Status: AC
  Administered 2014-09-20: 80 mg via INTRAMUSCULAR

## 2014-09-20 MED ORDER — KETOROLAC TROMETHAMINE 60 MG/2ML IM SOLN
60.0000 mg | Freq: Once | INTRAMUSCULAR | Status: AC
  Administered 2014-09-20: 60 mg via INTRAMUSCULAR

## 2014-09-20 NOTE — Progress Notes (Signed)
   Subjective:    Patient ID: Penny Moreno, female    DOB: 04-May-1965, 49 y.o.   MRN: [id number]  Back Pain This is a recurrent problem. The current episode started today. The problem occurs constantly. The problem is unchanged. The pain is present in the gluteal. The quality of the pain is described as aching. The pain radiates to the right thigh and right foot. The pain is at a severity of 10/10. The pain is moderate. The symptoms are aggravated by lying down, sitting and stress. Associated symptoms include leg pain, numbness and tingling. Pertinent negatives include no bladder incontinence, bowel incontinence, dysuria, fever, headaches or weakness. She has tried bed rest, ice, muscle relaxant and NSAIDs for the symptoms. The treatment provided mild relief.      Review of Systems  Constitutional: Negative.  Negative for fever.  HENT: Negative.   Eyes: Negative.   Respiratory: Negative.  Negative for shortness of breath.   Cardiovascular: Negative.  Negative for palpitations.  Gastrointestinal: Negative.  Negative for bowel incontinence.  Endocrine: Negative.   Genitourinary: Negative.  Negative for bladder incontinence and dysuria.  Musculoskeletal: Positive for back pain.  Neurological: Positive for tingling and numbness. Negative for weakness and headaches.  Hematological: Negative.   Psychiatric/Behavioral: Negative.   All other systems reviewed and are negative.      Objective:   Physical Exam  Constitutional: She is oriented to person, place, and time. She appears well-developed and well-nourished. No distress.  HENT:  Head: Normocephalic and atraumatic.  Right Ear: External ear normal.  Left Ear: External ear normal.  Nose: Nose normal.  Mouth/Throat: Oropharynx is clear and moist.  Eyes: Pupils are equal, round, and reactive to light.  Neck: Normal range of motion. Neck supple. No thyromegaly present.  Cardiovascular: Normal rate, regular rhythm, normal heart sounds  and intact distal pulses.   No murmur heard. Pulmonary/Chest: Effort normal and breath sounds normal. No respiratory distress. She has no wheezes.  Abdominal: Soft. Bowel sounds are normal. She exhibits no distension. There is no tenderness.  Musculoskeletal: Normal range of motion. She exhibits no edema or tenderness.  Full ROM, but slow from standing to sitting related to pain   Neurological: She is alert and oriented to person, place, and time. She has normal reflexes. No cranial nerve deficit.  Skin: Skin is warm and dry.  Psychiatric: She has a normal mood and affect. Her behavior is normal. Judgment and thought content normal.  Vitals reviewed.     BP 118/79 mmHg  Pulse 83  Temp(Src) 97.4 F (36.3 C) (Oral)  Ht 5\' 3"  (1.6 m)  Wt 149 lb (67.586 kg)  BMI 26.40 kg/m2  LMP 09/20/2011     Assessment & Plan:  1. Right-sided low back pain with right-sided sciatica -Rest -Ice  -Continue with Ibuprofen 800mg  as needed -Do not start Dosepak til tomorrow -Exercises and stretches discussed -RTO prn - ketorolac (TORADOL) injection 60 mg; Inject 2 mLs (60 mg total) into the muscle once. - methylPREDNISolone acetate (DEPO-MEDROL) injection 80 mg; Inject 1 mL (80 mg total) into the muscle once. - methylPREDNISolone (MEDROL DOSEPAK) 4 MG TBPK tablet; Use as directed  Dispense: 21 tablet; Refill: 0   [name], FNP

## 2014-09-20 NOTE — Patient Instructions (Signed)
Sciatica with Rehab The sciatic nerve runs from the back down the leg and is responsible for sensation and control of the muscles in the back (posterior) side of the thigh, lower leg, and foot. Sciatica is a condition that is characterized by inflammation of this nerve.  SYMPTOMS   Signs of nerve damage, including numbness and/or weakness along the posterior side of the lower extremity.  Pain in the back of the thigh that may also travel down the leg.  Pain that worsens when sitting for long periods of time.  Occasionally, pain in the back or buttock. CAUSES  Inflammation of the sciatic nerve is the cause of sciatica. The inflammation is due to something irritating the nerve. Common sources of irritation include:  Sitting for long periods of time.  Direct trauma to the nerve.  Arthritis of the spine.  Herniated or ruptured disk.  Slipping of the vertebrae (spondylolisthesis).  Pressure from soft tissues, such as muscles or ligament-like tissue (fascia). RISK INCREASES WITH:  Sports that place pressure or stress on the spine (football or weightlifting).  Poor strength and flexibility.  Failure to warm up properly before activity.  Family history of low back pain or disk disorders.  Previous back injury or surgery.  Poor body mechanics, especially when lifting, or poor posture. PREVENTION   Warm up and stretch properly before activity.  Maintain physical fitness:  Strength, flexibility, and endurance.  Cardiovascular fitness.  Learn and use proper technique, especially with posture and lifting. When possible, have coach correct improper technique.  Avoid activities that place stress on the spine. PROGNOSIS If treated properly, then sciatica usually resolves within 6 weeks. However, occasionally surgery is necessary.  RELATED COMPLICATIONS   Permanent nerve damage, including pain, numbness, tingle, or weakness.  Chronic back pain.  Risks of surgery: infection,  bleeding, nerve damage, or damage to surrounding tissues. TREATMENT Treatment initially involves resting from any activities that aggravate your symptoms. The use of ice and medication may help reduce pain and inflammation. The use of strengthening and stretching exercises may help reduce pain with activity. These exercises may be performed at home or with referral to a therapist. A therapist may recommend further treatments, such as transcutaneous electronic nerve stimulation (TENS) or ultrasound. Your caregiver may recommend corticosteroid injections to help reduce inflammation of the sciatic nerve. If symptoms persist despite non-surgical (conservative) treatment, then surgery may be recommended. MEDICATION  If pain medication is necessary, then nonsteroidal anti-inflammatory medications, such as aspirin and ibuprofen, or other minor pain relievers, such as acetaminophen, are often recommended.  Do not take pain medication for 7 days before surgery.  Prescription pain relievers may be given if deemed necessary by your caregiver. Use only as directed and only as much as you need.  Ointments applied to the skin may be helpful.  Corticosteroid injections may be given by your caregiver. These injections should be reserved for the most serious cases, because they may only be given a certain number of times. HEAT AND COLD  Cold treatment (icing) relieves pain and reduces inflammation. Cold treatment should be applied for 10 to 15 minutes every 2 to 3 hours for inflammation and pain and immediately after any activity that aggravates your symptoms. Use ice packs or massage the area with a piece of ice (ice massage).  Heat treatment may be used prior to performing the stretching and strengthening activities prescribed by your caregiver, physical therapist, or athletic trainer. Use a heat pack or soak the injury in warm water.   SEEK MEDICAL CARE IF:  Treatment seems to offer no benefit, or the condition  worsens.  Any medications produce adverse side effects. EXERCISES  RANGE OF MOTION (ROM) AND STRETCHING EXERCISES - Sciatica Most people with sciatic will find that their symptoms worsen with either excessive bending forward (flexion) or arching at the low back (extension). The exercises which will help resolve your symptoms will focus on the opposite motion. Your physician, physical therapist or athletic trainer will help you determine which exercises will be most helpful to resolve your low back pain. Do not complete any exercises without first consulting with your clinician. Discontinue any exercises which worsen your symptoms until you speak to your clinician. If you have pain, numbness or tingling which travels down into your buttocks, leg or foot, the goal of the therapy is for these symptoms to move closer to your back and eventually resolve. Occasionally, these leg symptoms will get better, but your low back pain may worsen; this is typically an indication of progress in your rehabilitation. Be certain to be very alert to any changes in your symptoms and the activities in which you participated in the 24 hours prior to the change. Sharing this information with your clinician will allow him/her to most efficiently treat your condition. These exercises may help you when beginning to rehabilitate your injury. Your symptoms may resolve with or without further involvement from your physician, physical therapist or athletic trainer. While completing these exercises, remember:   Restoring tissue flexibility helps normal motion to return to the joints. This allows healthier, less painful movement and activity.  An effective stretch should be held for at least 30 seconds.  A stretch should never be painful. You should only feel a gentle lengthening or release in the stretched tissue. FLEXION RANGE OF MOTION AND STRETCHING EXERCISES: STRETCH - Flexion, Single Knee to Chest   Lie on a firm bed or floor  with both legs extended in front of you.  Keeping one leg in contact with the floor, bring your opposite knee to your chest. Hold your leg in place by either grabbing behind your thigh or at your knee.  Pull until you feel a gentle stretch in your low back. Hold __________ seconds.  Slowly release your grasp and repeat the exercise with the opposite side. Repeat __________ times. Complete this exercise __________ times per day.  STRETCH - Flexion, Double Knee to Chest  Lie on a firm bed or floor with both legs extended in front of you.  Keeping one leg in contact with the floor, bring your opposite knee to your chest.  Tense your stomach muscles to support your back and then lift your other knee to your chest. Hold your legs in place by either grabbing behind your thighs or at your knees.  Pull both knees toward your chest until you feel a gentle stretch in your low back. Hold __________ seconds.  Tense your stomach muscles and slowly return one leg at a time to the floor. Repeat __________ times. Complete this exercise __________ times per day.  STRETCH - Low Trunk Rotation   Lie on a firm bed or floor. Keeping your legs in front of you, bend your knees so they are both pointed toward the ceiling and your feet are flat on the floor.  Extend your arms out to the side. This will stabilize your upper body by keeping your shoulders in contact with the floor.  Gently and slowly drop both knees together to one side until   you feel a gentle stretch in your low back. Hold for __________ seconds.  Tense your stomach muscles to support your low back as you bring your knees back to the starting position. Repeat the exercise to the other side. Repeat __________ times. Complete this exercise __________ times per day  EXTENSION RANGE OF MOTION AND FLEXIBILITY EXERCISES: STRETCH - Extension, Prone on Elbows  Lie on your stomach on the floor, a bed will be too soft. Place your palms about shoulder  width apart and at the height of your head.  Place your elbows under your shoulders. If this is too painful, stack pillows under your chest.  Allow your body to relax so that your hips drop lower and make contact more completely with the floor.  Hold this position for __________ seconds.  Slowly return to lying flat on the floor. Repeat __________ times. Complete this exercise __________ times per day.  RANGE OF MOTION - Extension, Prone Press Ups  Lie on your stomach on the floor, a bed will be too soft. Place your palms about shoulder width apart and at the height of your head.  Keeping your back as relaxed as possible, slowly straighten your elbows while keeping your hips on the floor. You may adjust the placement of your hands to maximize your comfort. As you gain motion, your hands will come more underneath your shoulders.  Hold this position __________ seconds.  Slowly return to lying flat on the floor. Repeat __________ times. Complete this exercise __________ times per day.  STRENGTHENING EXERCISES - Sciatica  These exercises may help you when beginning to rehabilitate your injury. These exercises should be done near your "sweet spot." This is the neutral, low-back arch, somewhere between fully rounded and fully arched, that is your least painful position. When performed in this safe range of motion, these exercises can be used for people who have either a flexion or extension based injury. These exercises may resolve your symptoms with or without further involvement from your physician, physical therapist or athletic trainer. While completing these exercises, remember:   Muscles can gain both the endurance and the strength needed for everyday activities through controlled exercises.  Complete these exercises as instructed by your physician, physical therapist or athletic trainer. Progress with the resistance and repetition exercises only as your caregiver advises.  You may  experience muscle soreness or fatigue, but the pain or discomfort you are trying to eliminate should never worsen during these exercises. If this pain does worsen, stop and make certain you are following the directions exactly. If the pain is still present after adjustments, discontinue the exercise until you can discuss the trouble with your clinician. STRENGTHENING - Deep Abdominals, Pelvic Tilt   Lie on a firm bed or floor. Keeping your legs in front of you, bend your knees so they are both pointed toward the ceiling and your feet are flat on the floor.  Tense your lower abdominal muscles to press your low back into the floor. This motion will rotate your pelvis so that your tail bone is scooping upwards rather than pointing at your feet or into the floor.  With a gentle tension and even breathing, hold this position for __________ seconds. Repeat __________ times. Complete this exercise __________ times per day.  STRENGTHENING - Abdominals, Crunches   Lie on a firm bed or floor. Keeping your legs in front of you, bend your knees so they are both pointed toward the ceiling and your feet are flat on the   floor. Cross your arms over your chest.  Slightly tip your chin down without bending your neck.  Tense your abdominals and slowly lift your trunk high enough to just clear your shoulder blades. Lifting higher can put excessive stress on the low back and does not further strengthen your abdominal muscles.  Control your return to the starting position. Repeat __________ times. Complete this exercise __________ times per day.  STRENGTHENING - Quadruped, Opposite UE/LE Lift  Assume a hands and knees position on a firm surface. Keep your hands under your shoulders and your knees under your hips. You may place padding under your knees for comfort.  Find your neutral spine and gently tense your abdominal muscles so that you can maintain this position. Your shoulders and hips should form a rectangle  that is parallel with the floor and is not twisted.  Keeping your trunk steady, lift your right hand no higher than your shoulder and then your left leg no higher than your hip. Make sure you are not holding your breath. Hold this position __________ seconds.  Continuing to keep your abdominal muscles tense and your back steady, slowly return to your starting position. Repeat with the opposite arm and leg. Repeat __________ times. Complete this exercise __________ times per day.  STRENGTHENING - Abdominals and Quadriceps, Straight Leg Raise   Lie on a firm bed or floor with both legs extended in front of you.  Keeping one leg in contact with the floor, bend the other knee so that your foot can rest flat on the floor.  Find your neutral spine, and tense your abdominal muscles to maintain your spinal position throughout the exercise.  Slowly lift your straight leg off the floor about 6 inches for a count of 15, making sure to not hold your breath.  Still keeping your neutral spine, slowly lower your leg all the way to the floor. Repeat this exercise with each leg __________ times. Complete this exercise __________ times per day. POSTURE AND BODY MECHANICS CONSIDERATIONS - Sciatica Keeping correct posture when sitting, standing or completing your activities will reduce the stress put on different body tissues, allowing injured tissues a chance to heal and limiting painful experiences. The following are general guidelines for improved posture. Your physician or physical therapist will provide you with any instructions specific to your needs. While reading these guidelines, remember:  The exercises prescribed by your provider will help you have the flexibility and strength to maintain correct postures.  The correct posture provides the optimal environment for your joints to work. All of your joints have less wear and tear when properly supported by a spine with good posture. This means you will  experience a healthier, less painful body.  Correct posture must be practiced with all of your activities, especially prolonged sitting and standing. Correct posture is as important when doing repetitive low-stress activities (typing) as it is when doing a single heavy-load activity (lifting). RESTING POSITIONS Consider which positions are most painful for you when choosing a resting position. If you have pain with flexion-based activities (sitting, bending, stooping, squatting), choose a position that allows you to rest in a less flexed posture. You would want to avoid curling into a fetal position on your side. If your pain worsens with extension-based activities (prolonged standing, working overhead), avoid resting in an extended position such as sleeping on your stomach. Most people will find more comfort when they rest with their spine in a more neutral position, neither too rounded nor too   arched. Lying on a non-sagging bed on your side with a pillow between your knees, or on your back with a pillow under your knees will often provide some relief. Keep in mind, being in any one position for a prolonged period of time, no matter how correct your posture, can still lead to stiffness. PROPER SITTING POSTURE In order to minimize stress and discomfort on your spine, you must sit with correct posture Sitting with good posture should be effortless for a healthy body. Returning to good posture is a gradual process. Many people can work toward this most comfortably by using various supports until they have the flexibility and strength to maintain this posture on their own. When sitting with proper posture, your ears will fall over your shoulders and your shoulders will fall over your hips. You should use the back of the chair to support your upper back. Your low back will be in a neutral position, just slightly arched. You may place a small pillow or folded towel at the base of your low back for support.  When  working at a desk, create an environment that supports good, upright posture. Without extra support, muscles fatigue and lead to excessive strain on joints and other tissues. Keep these recommendations in mind: CHAIR:   A chair should be able to slide under your desk when your back makes contact with the back of the chair. This allows you to work closely.  The chair's height should allow your eyes to be level with the upper part of your monitor and your hands to be slightly lower than your elbows. BODY POSITION  Your feet should make contact with the floor. If this is not possible, use a foot rest.  Keep your ears over your shoulders. This will reduce stress on your neck and low back. INCORRECT SITTING POSTURES   If you are feeling tired and unable to assume a healthy sitting posture, do not slouch or slump. This puts excessive strain on your back tissues, causing more damage and pain. Healthier options include:  Using more support, like a lumbar pillow.  Switching tasks to something that requires you to be upright or walking.  Talking a brief walk.  Lying down to rest in a neutral-spine position. PROLONGED STANDING WHILE SLIGHTLY LEANING FORWARD  When completing a task that requires you to lean forward while standing in one place for a long time, place either foot up on a stationary 2-4 inch high object to help maintain the best posture. When both feet are on the ground, the low back tends to lose its slight inward curve. If this curve flattens (or becomes too large), then the back and your other joints will experience too much stress, fatigue more quickly and can cause pain.  CORRECT STANDING POSTURES Proper standing posture should be assumed with all daily activities, even if they only take a few moments, like when brushing your teeth. As in sitting, your ears should fall over your shoulders and your shoulders should fall over your hips. You should keep a slight tension in your abdominal  muscles to brace your spine. Your tailbone should point down to the ground, not behind your body, resulting in an over-extended swayback posture.  INCORRECT STANDING POSTURES  Common incorrect standing postures include a forward head, locked knees and/or an excessive swayback. WALKING Walk with an upright posture. Your ears, shoulders and hips should all line-up. PROLONGED ACTIVITY IN A FLEXED POSITION When completing a task that requires you to bend forward   at your waist or lean over a low surface, try to find a way to stabilize 3 of 4 of your limbs. You can place a hand or elbow on your thigh or rest a knee on the surface you are reaching across. This will provide you more stability so that your muscles do not fatigue as quickly. By keeping your knees relaxed, or slightly bent, you will also reduce stress across your low back. CORRECT LIFTING TECHNIQUES DO :   Assume a wide stance. This will provide you more stability and the opportunity to get as close as possible to the object which you are lifting.  Tense your abdominals to brace your spine; then bend at the knees and hips. Keeping your back locked in a neutral-spine position, lift using your leg muscles. Lift with your legs, keeping your back straight.  Test the weight of unknown objects before attempting to lift them.  Try to keep your elbows locked down at your sides in order get the best strength from your shoulders when carrying an object.  Always ask for help when lifting heavy or awkward objects. INCORRECT LIFTING TECHNIQUES DO NOT:   Lock your knees when lifting, even if it is a small object.  Bend and twist. Pivot at your feet or move your feet when needing to change directions.  Assume that you cannot safely pick up a paperclip without proper posture. Document Released: 01/19/2005 Document Revised: 06/05/2013 Document Reviewed: 05/03/2008 [employer] Patient Information 2015 [employer]. This information is not intended to  replace advice given to you by your health care provider. Make sure you discuss any questions you have with your health care provider.  

## 2014-09-26 ENCOUNTER — Ambulatory Visit (INDEPENDENT_AMBULATORY_CARE_PROVIDER_SITE_OTHER): Payer: Medicaid Other | Admitting: Obstetrics and Gynecology

## 2014-09-26 DIAGNOSIS — R87612 Low grade squamous intraepithelial lesion on cytologic smear of cervix (LGSIL): Secondary | ICD-10-CM

## 2014-09-26 IMAGING — CR DG LUMBAR SPINE 2-3V
2 series · 2 of 2 positions shown · non-contrast
Comparison: None.

CLINICAL DATA: Back pain

EXAM:
LUMBAR SPINE - 2-3 VIEW

[view not recorded (1 of 2)]
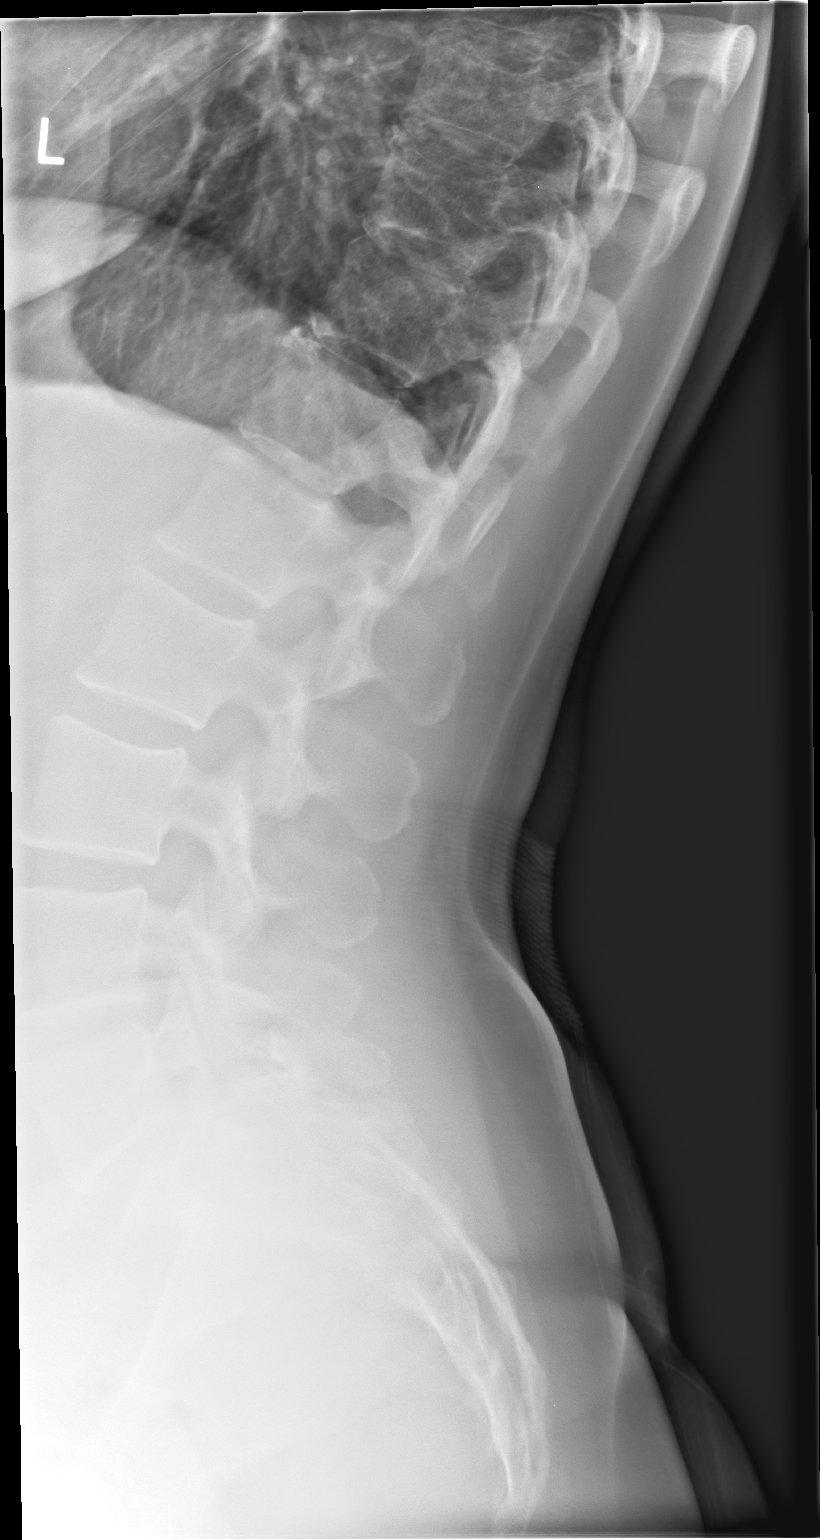

[view not recorded (2 of 2)]
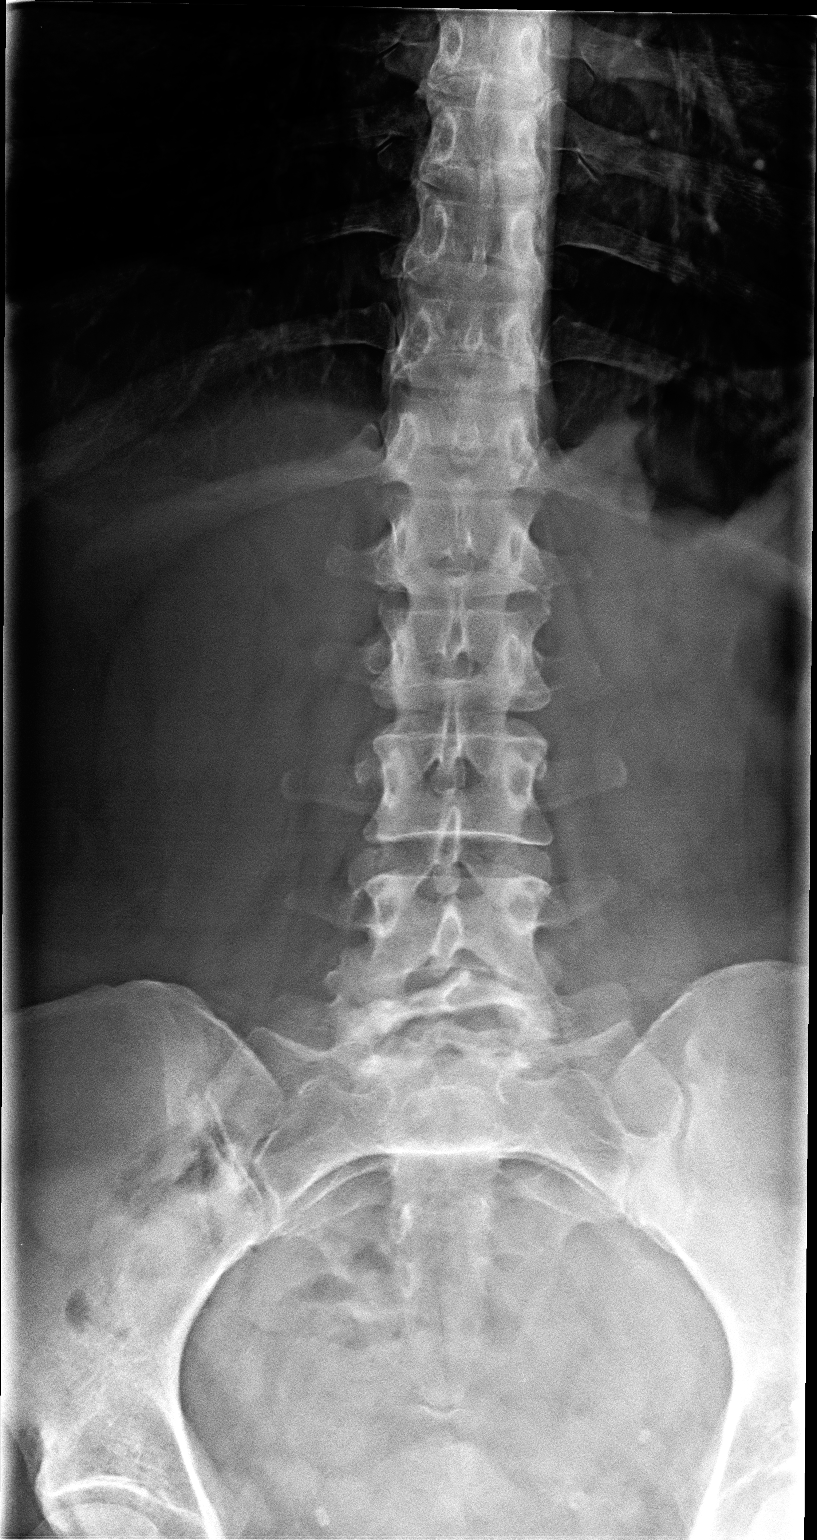

[2 of 2 positions shown; findings below may reference images not displayed]

FINDINGS: The study is suboptimal with the patient positioned such that the
lumbar spine is not centered within the collimated field of view.
The L3, L4, and L5 vertebral bodies are not included completely in
the field of view on the lateral projection. There is no evidence of
lumbar spine fracture. Alignment is normal. Intervertebral disc
spaces are maintained. Tubal ligation clips partly visualized over
the pelvis.
IMPRESSION: Although there is no gross evidence for compression deformity, the
lateral view is markedly suboptimal due to improper patient
positioning as above, with incomplete inclusion of L3, L4, and L5 in
the field of view.

## 2014-09-26 IMAGING — CR DG CHEST 2V
2 series · 2 of 2 positions shown · non-contrast
Comparison: None.

CLINICAL DATA: Chest pain.

EXAM:
CHEST  2 VIEW

[view not recorded (1 of 2)]
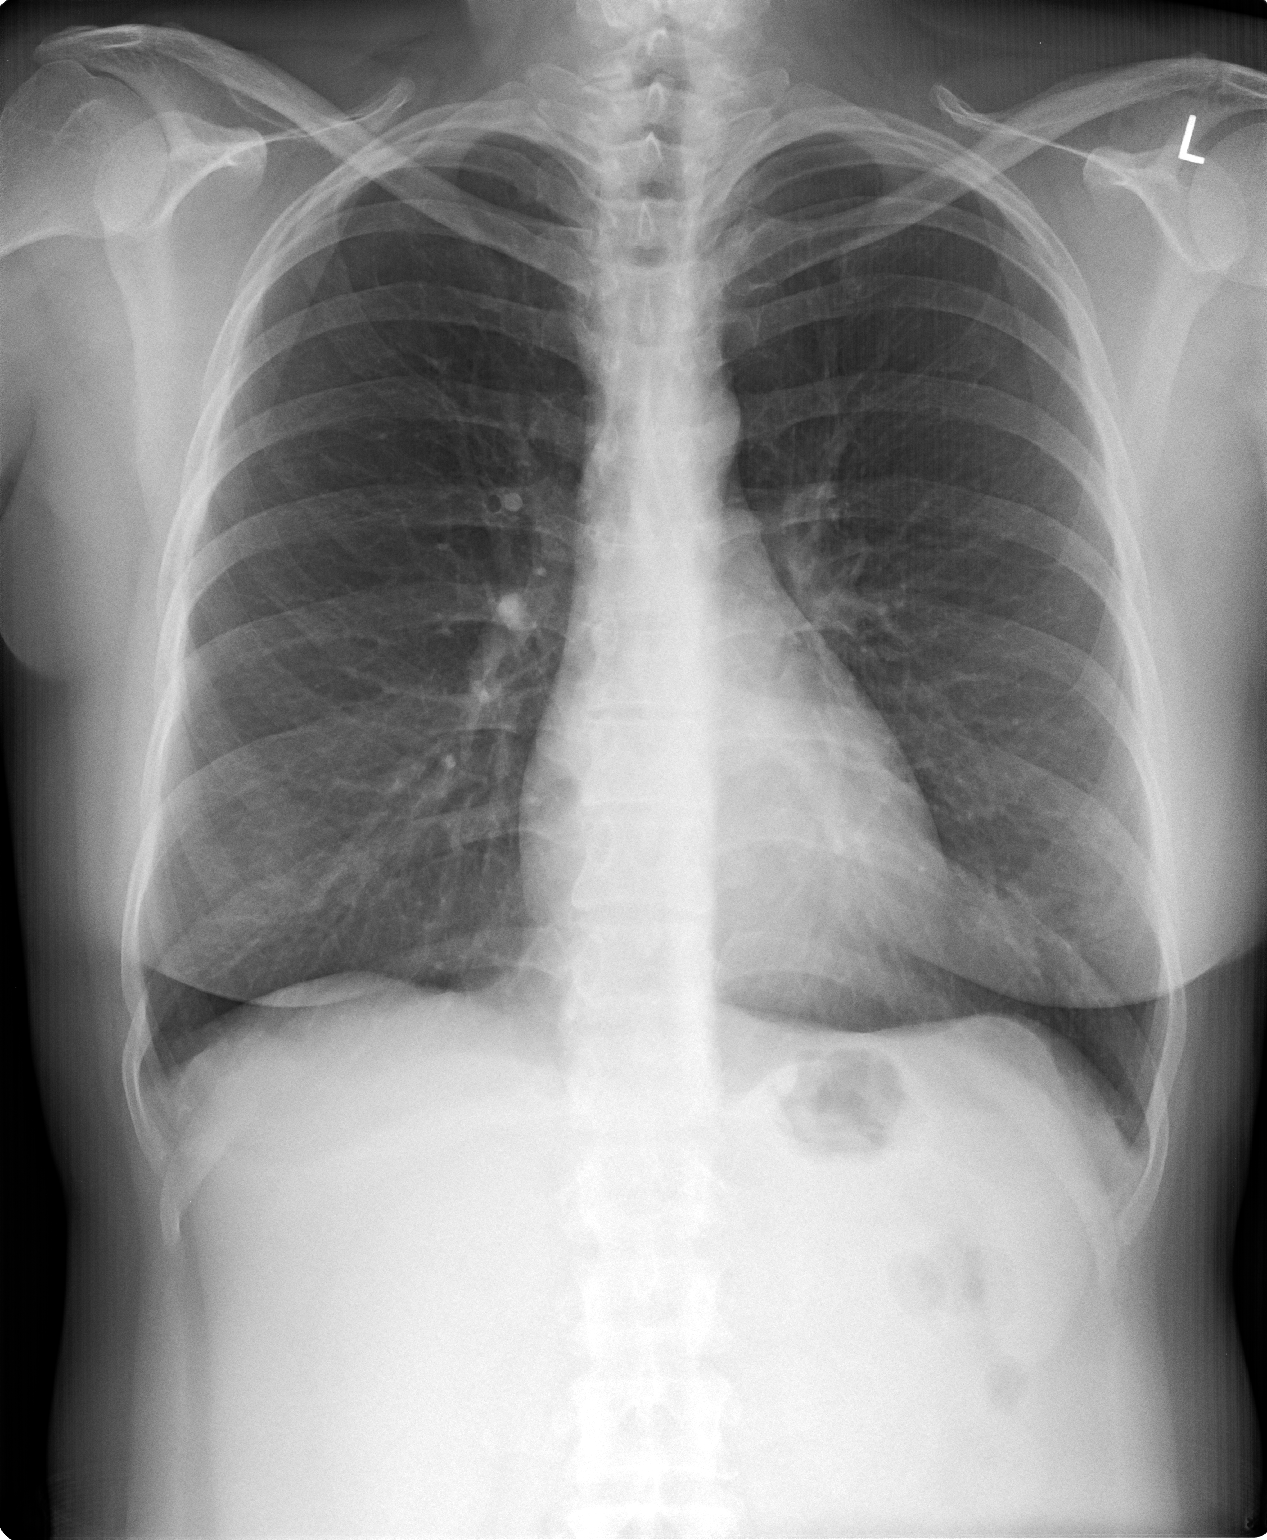

[view not recorded (2 of 2)]
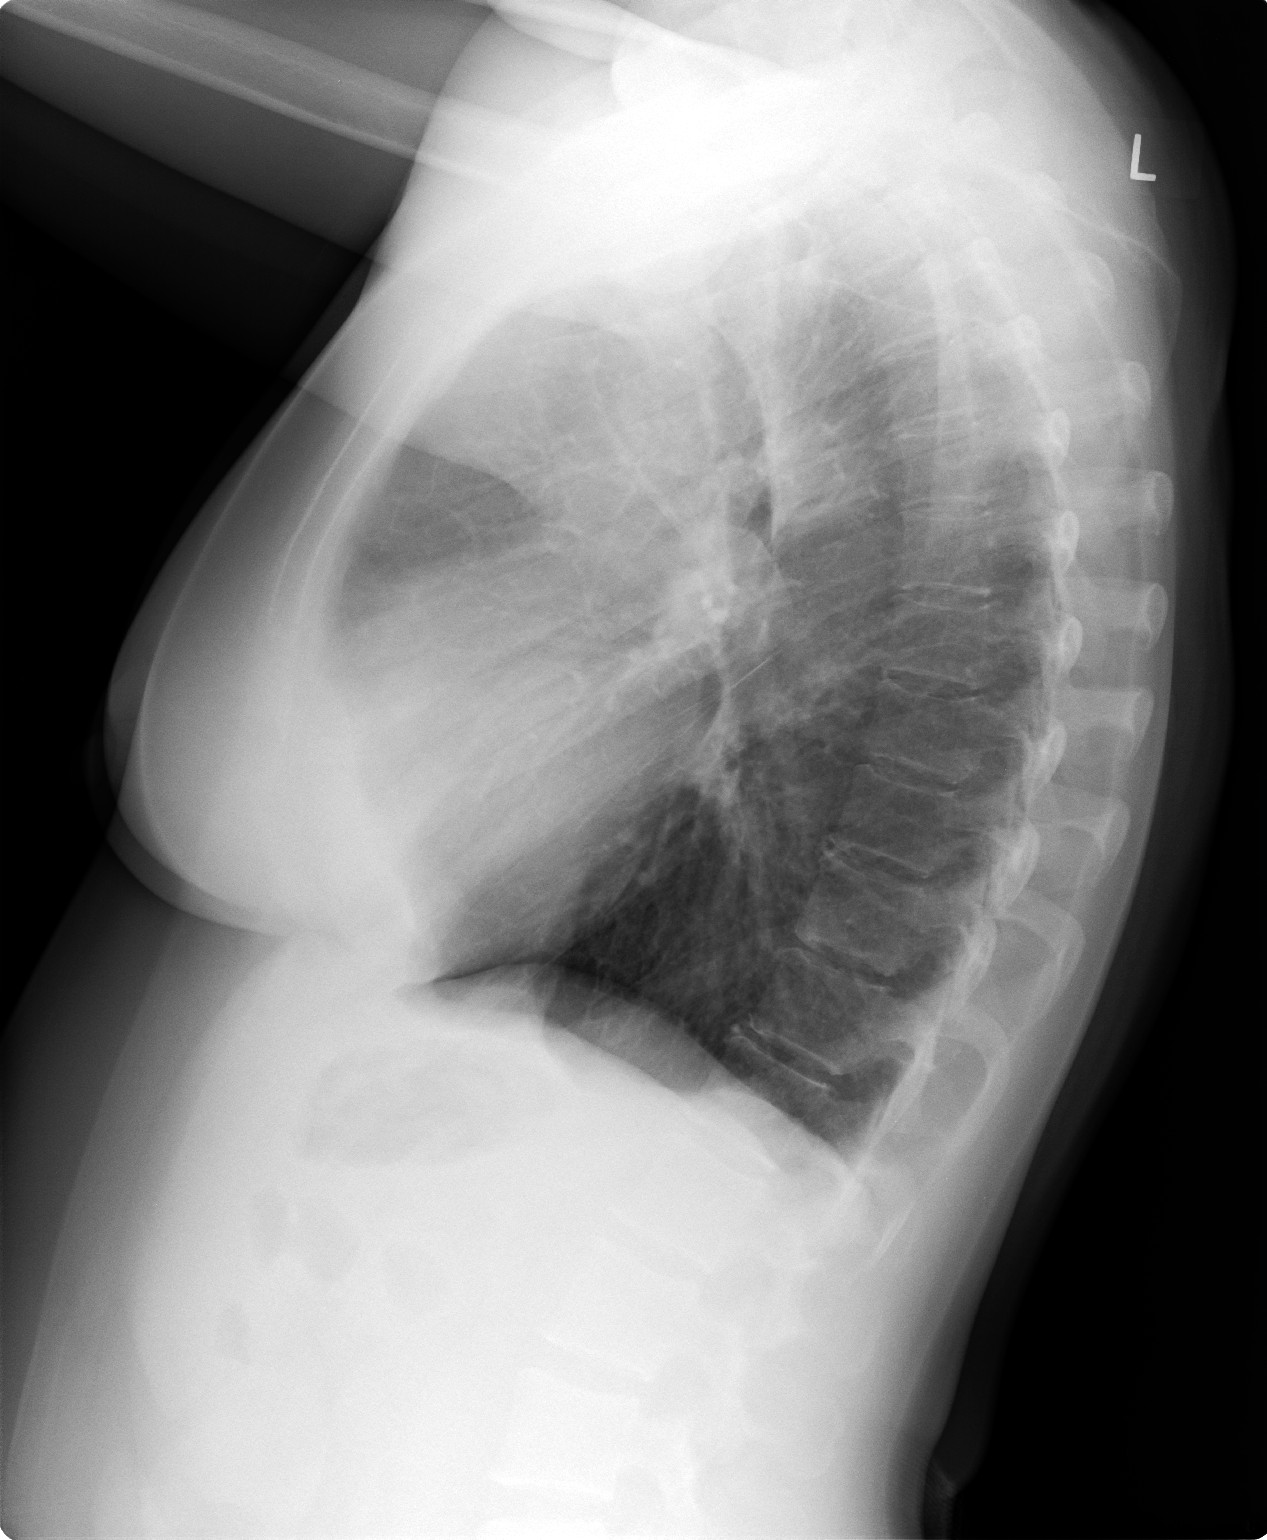

[2 of 2 positions shown; findings below may reference images not displayed]

FINDINGS: The heart size and mediastinal contours are within normal limits.
Both lungs are clear. The visualized skeletal structures are
unremarkable.
IMPRESSION: No active cardiopulmonary disease.

## 2014-09-26 NOTE — Progress Notes (Signed)
Appointment to be rescheduled due to computers being down, and prior appointments today with complications. Pt to be called in the a.m.

## 2014-09-28 NOTE — Telephone Encounter (Signed)
Pt had to have her LEEP appt on 8/24 rescheduled due to computer shutdown, and i called pt to insure that she will be keeping next appt.

## 2014-10-02 ENCOUNTER — Ambulatory Visit (INDEPENDENT_AMBULATORY_CARE_PROVIDER_SITE_OTHER): Payer: Medicaid Other | Admitting: Family

## 2014-10-02 ENCOUNTER — Encounter: Payer: Self-pay | Admitting: Family

## 2014-10-02 VITALS — BP 100/68 | HR 90 | Temp 98.0°F | Ht 63.0 in | Wt 147.8 lb

## 2014-10-02 DIAGNOSIS — M797 Fibromyalgia: Secondary | ICD-10-CM | POA: Diagnosis not present

## 2014-10-02 DIAGNOSIS — B370 Candidal stomatitis: Secondary | ICD-10-CM

## 2014-10-02 DIAGNOSIS — M1990 Unspecified osteoarthritis, unspecified site: Secondary | ICD-10-CM | POA: Diagnosis not present

## 2014-10-02 DIAGNOSIS — E1169 Type 2 diabetes mellitus with other specified complication: Secondary | ICD-10-CM | POA: Insufficient documentation

## 2014-10-02 DIAGNOSIS — K219 Gastro-esophageal reflux disease without esophagitis: Secondary | ICD-10-CM | POA: Diagnosis not present

## 2014-10-02 DIAGNOSIS — K5900 Constipation, unspecified: Secondary | ICD-10-CM

## 2014-10-02 DIAGNOSIS — E785 Hyperlipidemia, unspecified: Secondary | ICD-10-CM

## 2014-10-02 DIAGNOSIS — F329 Major depressive disorder, single episode, unspecified: Secondary | ICD-10-CM | POA: Diagnosis not present

## 2014-10-02 DIAGNOSIS — F411 Generalized anxiety disorder: Secondary | ICD-10-CM | POA: Diagnosis not present

## 2014-10-02 DIAGNOSIS — M8580 Other specified disorders of bone density and structure, unspecified site: Secondary | ICD-10-CM

## 2014-10-02 DIAGNOSIS — M545 Low back pain, unspecified: Secondary | ICD-10-CM

## 2014-10-02 DIAGNOSIS — Z0000 Encounter for general adult medical examination without abnormal findings: Secondary | ICD-10-CM

## 2014-10-02 DIAGNOSIS — F32A Depression, unspecified: Secondary | ICD-10-CM

## 2014-10-02 MED ORDER — IBUPROFEN 800 MG PO TABS: 800.0000 mg | ORAL_TABLET | Freq: Four times a day (QID) | ORAL | Status: DC | PRN

## 2014-10-02 MED ORDER — NYSTATIN 100000 UNIT/ML MT SUSP: 5.0000 mL | Freq: Four times a day (QID) | OROMUCOSAL | Status: DC

## 2014-10-02 MED ORDER — CYCLOBENZAPRINE HCL 10 MG PO TABS: 10.0000 mg | ORAL_TABLET | Freq: Three times a day (TID) | ORAL | Status: DC | PRN

## 2014-10-02 MED ORDER — BENEFIBER PO POWD: 2.0000 | Freq: Every day | ORAL | Status: DC

## 2014-10-02 MED ORDER — GABAPENTIN 300 MG PO CAPS: 300.0000 mg | ORAL_CAPSULE | Freq: Every day | ORAL | Status: DC

## 2014-10-02 MED ORDER — OMEPRAZOLE 20 MG PO CPDR
20.0000 mg | DELAYED_RELEASE_CAPSULE | Freq: Every day | ORAL | Status: DC
Start: 2014-10-02 — End: 2015-02-25

## 2014-10-02 NOTE — Addendum Note (Signed)
Addended by: Earlene Plater on: 10/02/2014 03:10 PM   Modules accepted: Miquel Dunn

## 2014-10-02 NOTE — Patient Instructions (Addendum)
Health Maintenance Adopting a healthy lifestyle and getting preventive care can go a long way to promote health and wellness. Talk with your health care provider about what schedule of regular examinations is right for you. This is a good chance for you to check in with your provider about disease prevention and staying healthy. In between checkups, there are plenty of things you can do on your own. Experts have done a lot of research about which lifestyle changes and preventive measures are most likely to keep you healthy. Ask your health care provider for more information. WEIGHT AND DIET  Eat a healthy diet  Be sure to include plenty of vegetables, fruits, low-fat dairy products, and lean protein.  Do not eat a lot of foods high in solid fats, added sugars, or salt.  Get regular exercise. This is one of the most important things you can do for your health.  Most adults should exercise for at least 150 minutes each week. The exercise should increase your heart rate and make you sweat (moderate-intensity exercise).  Most adults should also do strengthening exercises at least twice a week. This is in addition to the moderate-intensity exercise.  Maintain a healthy weight  Body mass index (BMI) is a measurement that can be used to identify possible weight problems. It estimates body fat based on height and weight. Your health care provider can help determine your BMI and help you achieve or maintain a healthy weight.  For females 25 years of age and older:   A BMI below 18.5 is considered underweight.  A BMI of 18.5 to 24.9 is normal.  A BMI of 25 to 29.9 is considered overweight.  A BMI of 30 and above is considered obese.  Watch levels of cholesterol and blood lipids  You should start having your blood tested for lipids and cholesterol at 47 years of age, then have this test every 5 years.  You may need to have your cholesterol levels checked more often if:  Your lipid or  cholesterol levels are high.  You are older than 47 years of age.  You are at high risk for heart disease.  CANCER SCREENING   Lung Cancer  Lung cancer screening is recommended for adults 97-92 years old who are at high risk for lung cancer because of a history of smoking.  A yearly low-dose CT scan of the lungs is recommended for people who:  Currently smoke.  Have quit within the past 15 years.  Have at least a 30-pack-year history of smoking. A pack year is smoking an average of one pack of cigarettes a day for 1 year.  Yearly screening should continue until it has been 15 years since you quit.  Yearly screening should stop if you develop a health problem that would prevent you from having lung cancer treatment.  Breast Cancer  Practice breast self-awareness. This means understanding how your breasts normally appear and feel.  It also means doing regular breast self-exams. Let your health care provider know about any changes, no matter how small.  If you are in your 20s or 30s, you should have a clinical breast exam (CBE) by a health care provider every 1-3 years as part of a regular health exam.  If you are 76 or older, have a CBE every year. Also consider having a breast X-ray (mammogram) every year.  If you have a family history of breast cancer, talk to your health care provider about genetic screening.  If you are  at high risk for breast cancer, talk to your health care provider about having an MRI and a mammogram every year.  Breast cancer gene (BRCA) assessment is recommended for women who have family members with BRCA-related cancers. BRCA-related cancers include:  Breast.  Ovarian.  Tubal.  Peritoneal cancers.  Results of the assessment will determine the need for genetic counseling and BRCA1 and BRCA2 testing. Cervical Cancer Routine pelvic examinations to screen for cervical cancer are no longer recommended for nonpregnant women who are considered low  risk for cancer of the pelvic organs (ovaries, uterus, and vagina) and who do not have symptoms. A pelvic examination may be necessary if you have symptoms including those associated with pelvic infections. Ask your health care provider if a screening pelvic exam is right for you.   The Pap test is the screening test for cervical cancer for women who are considered at risk.  If you had a hysterectomy for a problem that was not cancer or a condition that could lead to cancer, then you no longer need Pap tests.  If you are older than 65 years, and you have had normal Pap tests for the past 10 years, you no longer need to have Pap tests.  If you have had past treatment for cervical cancer or a condition that could lead to cancer, you need Pap tests and screening for cancer for at least 20 years after your treatment.  If you no longer get a Pap test, assess your risk factors if they change (such as having a new sexual partner). This can affect whether you should start being screened again.  Some women have medical problems that increase their chance of getting cervical cancer. If this is the case for you, your health care provider may recommend more frequent screening and Pap tests.  The human papillomavirus (HPV) test is another test that may be used for cervical cancer screening. The HPV test looks for the virus that can cause cell changes in the cervix. The cells collected during the Pap test can be tested for HPV.  The HPV test can be used to screen women 30 years of age and older. Getting tested for HPV can extend the interval between normal Pap tests from three to five years.  An HPV test also should be used to screen women of any age who have unclear Pap test results.  After 47 years of age, women should have HPV testing as often as Pap tests.  Colorectal Cancer  This type of cancer can be detected and often prevented.  Routine colorectal cancer screening usually begins at 47 years of  age and continues through 47 years of age.  Your health care provider may recommend screening at an earlier age if you have risk factors for colon cancer.  Your health care provider may also recommend using home test kits to check for hidden blood in the stool.  A small camera at the end of a tube can be used to examine your colon directly (sigmoidoscopy or colonoscopy). This is done to check for the earliest forms of colorectal cancer.  Routine screening usually begins at age 50.  Direct examination of the colon should be repeated every 5-10 years through 47 years of age. However, you may need to be screened more often if early forms of precancerous polyps or small growths are found. Skin Cancer  Check your skin from head to toe regularly.  Tell your health care provider about any new moles or changes in   moles, especially if there is a change in a mole's shape or color.  Also tell your health care provider if you have a mole that is larger than the size of a pencil eraser.  Always use sunscreen. Apply sunscreen liberally and repeatedly throughout the day.  Protect yourself by wearing long sleeves, pants, a wide-brimmed hat, and sunglasses whenever you are outside. HEART DISEASE, DIABETES, AND HIGH BLOOD PRESSURE   Have your blood pressure checked at least every 1-2 years. High blood pressure causes heart disease and increases the risk of stroke.  If you are between 75 years and 42 years old, ask your health care provider if you should take aspirin to prevent strokes.  Have regular diabetes screenings. This involves taking a blood sample to check your fasting blood sugar level.  If you are at a normal weight and have a low risk for diabetes, have this test once every three years after 47 years of age.  If you are overweight and have a high risk for diabetes, consider being tested at a younger age or more often. PREVENTING INFECTION  Hepatitis B  If you have a higher risk for  hepatitis B, you should be screened for this virus. You are considered at high risk for hepatitis B if:  You were born in a country where hepatitis B is common. Ask your health care provider which countries are considered high risk.  Your parents were born in a high-risk country, and you have not been immunized against hepatitis B (hepatitis B vaccine).  You have HIV or AIDS.  You use needles to inject street drugs.  You live with someone who has hepatitis B.  You have had sex with someone who has hepatitis B.  You get hemodialysis treatment.  You take certain medicines for conditions, including cancer, organ transplantation, and autoimmune conditions. Hepatitis C  Blood testing is recommended for:  Everyone born from [birth year] through 1963.  Anyone with known risk factors for hepatitis C. Sexually transmitted infections (STIs)  You should be screened for sexually transmitted infections (STIs) including gonorrhea and chlamydia if:  You are sexually active and are younger than 48 years of age.  You are older than 47 years of age and your health care provider tells you that you are at risk for this type of infection.  Your sexual activity has changed since you were last screened and you are at an increased risk for chlamydia or gonorrhea. Ask your health care provider if you are at risk.  If you do not have HIV, but are at risk, it may be recommended that you take a prescription medicine daily to prevent HIV infection. This is called pre-exposure prophylaxis (PrEP). You are considered at risk if:  You are sexually active and do not regularly use condoms or know the HIV status of your partner(s).  You take drugs by injection.  You are sexually active with a partner who has HIV. Talk with your health care provider about whether you are at high risk of being infected with HIV. If you choose to begin PrEP, you should first be tested for HIV. You should then be tested every 3 months for  as long as you are taking PrEP.  PREGNANCY   If you are premenopausal and you may become pregnant, ask your health care provider about preconception counseling.  If you may become pregnant, take 400 to 800 micrograms (mcg) of folic acid every day.  If you want to prevent pregnancy, talk to your  health care provider about birth control (contraception). OSTEOPOROSIS AND MENOPAUSE   Osteoporosis is a disease in which the bones lose minerals and strength with aging. This can result in serious bone fractures. Your risk for osteoporosis can be identified using a bone density scan.  If you are 22 years of age or older, or if you are at risk for osteoporosis and fractures, ask your health care provider if you should be screened.  Ask your health care provider whether you should take a calcium or vitamin D supplement to lower your risk for osteoporosis.  Menopause may have certain physical symptoms and risks.  Hormone replacement therapy may reduce some of these symptoms and risks. Talk to your health care provider about whether hormone replacement therapy is right for you.  HOME CARE INSTRUCTIONS   Schedule regular health, dental, and eye exams.  Stay current with your immunizations.   Do not use any tobacco products including cigarettes, chewing tobacco, or electronic cigarettes.  If you are pregnant, do not drink alcohol.  If you are breastfeeding, limit how much and how often you drink alcohol.  Limit alcohol intake to no more than 1 drink per day for nonpregnant women. One drink equals 12 ounces of beer, 5 ounces of wine, or 1 ounces of hard liquor.  Do not use street drugs.  Do not share needles.  Ask your health care provider for help if you need support or information about quitting drugs.  Tell your health care provider if you often feel depressed.  Tell your health care provider if you have ever been abused or do not feel safe at home. Document Released: 08/04/2010  Document Revised: 06/05/2013 Document Reviewed: 12/21/2012 [facility] Patient Information 2015 [place], [place]. This information is not intended to replace advice given to you by your health care provider. Make sure you discuss any questions you have with your health care provider. Thrush, Adult  Ritta Slot, also called oral candidiasis, is a fungal infection that develops in the mouth and throat and on the tongue. It causes white patches to form on the mouth and tongue. Ritta Slot is most common in older adults, but it can occur at any age.  Many cases of thrush are mild, but this infection can also be more serious. Ritta Slot can be a recurring problem for people who have chronic illnesses or who take medicines that limit the body's ability to fight infection. Because these people have difficulty fighting infections, the fungus that causes thrush can spread throughout the body. This can cause life-threatening blood or organ infections. CAUSES  Ritta Slot is usually caused by a yeast called Candida albicans. This fungus is normally present in small amounts in the mouth and on other mucous membranes. It usually causes no harm. However, when conditions are present that allow the fungus to grow uncontrolled, it invades surrounding tissues and becomes an infection. Less often, other Candida species can also lead to thrush.  RISK FACTORS Ritta Slot is more likely to develop in the following people:  People with an impaired ability to fight infection (weakened immune system).   Older adults.   People with HIV.   People with diabetes.   People with dry mouth (xerostomia).   Pregnant women.   People with poor dental care, especially those who have false teeth.   People who use antibiotic medicines.  SIGNS AND SYMPTOMS  Ritta Slot can be a mild infection that causes no symptoms. If symptoms develop, they may include:   A burning feeling in the mouth and  throat. This can occur at the start of a thrush infection.    White patches that adhere to the mouth and tongue. The tissue around the patches may be red, raw, and painful. If rubbed (during tooth brushing, for example), the patches and the tissue of the mouth may bleed easily.   A bad taste in the mouth or difficulty tasting foods.   Cottony feeling in the mouth.   Pain during eating and swallowing. DIAGNOSIS  Your health care provider can usually diagnose thrush by looking in your mouth and asking you questions about your health.  TREATMENT  Medicines that help prevent the growth of fungi (antifungals) are the standard treatment for thrush. These medicines are either applied directly to the affected area (topical) or swallowed (oral). The treatment will depend on the severity of the condition.  Mild Thrush Mild cases of thrush may clear up with the use of an antifungal mouth rinse or lozenges. Treatment usually lasts about 14 days.  Moderate to Severe Thrush  More severe thrush infections that have spread to the esophagus are treated with an oral antifungal medicine. A topical antifungal medicine may also be used.   For some severe infections, a treatment period longer than 14 days may be needed.   Oral antifungal medicines are almost never used during pregnancy because the fetus may be harmed. However, if a pregnant woman has a rare, severe thrush infection that has spread to her blood, oral antifungal medicines may be used. In this case, the risk of harm to the mother and fetus from the severe thrush infection may be greater than the risk posed by the use of antifungal medicines.  Persistent or Recurrent Thrush For cases of thrush that do not go away or keep coming back, treatment may involve the following:   Treatment may be needed twice as long as the symptoms last.   Treatment will include both oral and topical antifungal medicines.   People with weakened immune systems can take an antifungal medicine on a continuous basis to  prevent thrush infections.  It is important to treat conditions that make you more likely to get thrush, such as diabetes or HIV.  HOME CARE INSTRUCTIONS   Only take over-the-counter or prescription medicine as directed by your health care provider. Talk to your health care provider about an over-the-counter medicine called gentian violet, which kills bacteria and fungi.   Eat plain, unflavored yogurt as directed by your health care provider. Check the label to make sure the yogurt contains live cultures. This yogurt can help healthy bacteria grow in the mouth that can stop the growth of the fungus that causes thrush.   Try these measures to help reduce the discomfort of thrush:   Drink cold liquids such as water or iced tea.   Try flavored ice treats or frozen juices.   Eat foods that are easy to swallow, such as gelatin, ice cream, or custard.   If the patches in your mouth are painful, try drinking from a straw.   Rinse your mouth several times a day with a warm saltwater rinse. You can make the saltwater mixture with 1 tsp (6 g) of salt in 8 fl oz (0.2 L) of warm water.   If you wear dentures, remove the dentures before going to bed, brush them vigorously, and soak them in a cleaning solution as directed by your health care provider.   Women who are breastfeeding should clean their nipples with an antifungal medicine as directed  by their health care provider. Dry the nipples after breastfeeding. Applying lanolin-containing body lotion may help relieve nipple soreness.  SEEK MEDICAL CARE IF:  Your symptoms are getting worse or are not improving within 7 days of starting treatment.   You have symptoms of spreading infection, such as white patches on the skin outside of the mouth.   You are nursing and you have redness, burning, or pain in the nipples that is not relieved with treatment.  MAKE SURE YOU:  Understand these instructions.  Will watch your  condition.  Will get help right away if you are not doing well or get worse. Document Released: 10/15/2003 Document Revised: 11/09/2012 Document Reviewed: 08/22/2012 [facility] Patient Information 2015 [place], [place]. This information is not intended to replace advice given to you by your health care provider. Make sure you discuss any questions you have with your health care provider.

## 2014-10-02 NOTE — Progress Notes (Signed)
Subjective:    Patient ID: Penny Moreno, female    DOB: [birth year], 49 y.o.   MRN: [id number]  Pt presents to the office today for CPE without pap.  Hyperlipidemia This is a chronic problem. The current episode started more than 1 year ago. The problem is uncontrolled. Recent lipid tests were reviewed and are high. She has no history of diabetes. Factors aggravating her hyperlipidemia include smoking. Associated symptoms include myalgias. Pertinent negatives include no leg pain or shortness of breath. Current antihyperlipidemic treatment includes ezetimibe and statins. The current treatment provides moderate improvement of lipids. Risk factors for coronary artery disease include dyslipidemia, family history and a sedentary lifestyle.  Gastrophageal Reflux She reports no belching, no coughing or no heartburn. This is a chronic problem. The current episode started more than 1 year ago. The problem occurs rarely. The symptoms are aggravated by lying down and certain foods. Risk factors include smoking/tobacco exposure. She has tried a PPI for the symptoms. The treatment provided significant relief.  Anxiety Presents for follow-up (Pt is see's psychiatry  every 3-6 months) visit. Onset was 1 to 6 months ago. The problem has been waxing and waning. Symptoms include depressed mood, excessive worry and nervous/anxious behavior. Patient reports no irritability, palpitations, panic or shortness of breath. The severity of symptoms is mild. The quality of sleep is good.   Her past medical history is significant for anxiety/panic attacks and depression.  Depression      The patient presents with depression.  This is a chronic problem.  The current episode started more than 1 year ago.   The onset quality is gradual.   The problem occurs intermittently.  The problem has been waxing and waning since onset.  Associated symptoms include irritable, myalgias and sad.  Associated symptoms include no helplessness,  no hopelessness and no headaches.  Past treatments include SSRIs - Selective serotonin reuptake inhibitors.  Past medical history includes anxiety and depression.       Review of Systems  Constitutional: Negative.  Negative for irritability.  HENT: Negative.   Eyes: Negative.   Respiratory: Negative.  Negative for cough and shortness of breath.   Cardiovascular: Negative.  Negative for palpitations.  Gastrointestinal: Negative.  Negative for heartburn.  Endocrine: Negative.   Genitourinary: Negative.   Musculoskeletal: Positive for myalgias and back pain.  Neurological: Negative.  Negative for headaches.  Hematological: Negative.   Psychiatric/Behavioral: Positive for depression. The patient is nervous/anxious.   All other systems reviewed and are negative.      Objective:   Physical Exam  Constitutional: She is oriented to person, place, and time. She appears well-developed and well-nourished. She is irritable. No distress.  HENT:  Head: Normocephalic and atraumatic.  Right Ear: External ear normal.  Left Ear: External ear normal.  Nose: Nose normal.  Mouth/Throat: Oropharynx is clear and moist.  Eyes: Pupils are equal, round, and reactive to light.  Neck: Normal range of motion. Neck supple. No thyromegaly present.  Cardiovascular: Normal rate, regular rhythm, normal heart sounds and intact distal pulses.   No murmur heard. Pulmonary/Chest: Effort normal and breath sounds normal. No respiratory distress. She has no wheezes.  Abdominal: Soft. Bowel sounds are normal. She exhibits no distension. There is no tenderness.  Musculoskeletal: Normal range of motion. She exhibits no edema or tenderness.  Neurological: She is alert and oriented to person, place, and time. She has normal reflexes. No cranial nerve deficit.  Skin: Skin is warm and dry.  Psychiatric: She  has a normal mood and affect. Her behavior is normal. Judgment and thought content normal.  Vitals reviewed.   BP  100/68 mmHg  Pulse 90  Temp(Src) 98 F (36.7 C) (Oral)  Ht _0  (1.6 m)  Wt 147 lb 12.8 oz (67.042 kg)  BMI 26.19 kg/m2  LMP 09/20/2011       Assessment & Plan:  1. Left-sided low back pain without sciatica - cyclobenzaprine (FLEXERIL) 10 MG tablet; Take 1 tablet (10 mg total) by mouth 3 (three) times daily as needed for muscle spasms.  Dispense: 90 tablet; Refill: 6 - ibuprofen (ADVIL,MOTRIN) 800 MG tablet; Take 1 tablet (800 mg total) by mouth every 6 (six) hours as needed.  Dispense: 30 tablet; Refill: 3 - CMP14+EGFR  2. Depression - CMP14+EGFR  3. Hyperlipidemia - CMP14+EGFR - Lipid panel  4. GAD (generalized anxiety disorder) - CMP14+EGFR  5. Arthritis - ibuprofen (ADVIL,MOTRIN) 800 MG tablet; Take 1 tablet (800 mg total) by mouth every 6 (six) hours as needed.  Dispense: 30 tablet; Refill: 3 - CMP14+EGFR  6. Fibromyalgia - gabapentin (NEURONTIN) 300 MG capsule; Take 1 capsule (300 mg total) by mouth at bedtime.  Dispense: 90 capsule; Refill: 3 - ibuprofen (ADVIL,MOTRIN) 800 MG tablet; Take 1 tablet (800 mg total) by mouth every 6 (six) hours as needed.  Dispense: 30 tablet; Refill: 3 - CMP14+EGFR  7. Osteopenia - CMP14+EGFR  8. Gastroesophageal reflux disease, esophagitis presence not specified - omeprazole (PRILOSEC) 20 MG capsule; Take 1 capsule (20 mg total) by mouth daily.  Dispense: 90 capsule; Refill: 3 - CMP14+EGFR  9. Constipation, unspecified constipation type - Wheat Dextrin (BENEFIBER) POWD; Take 2 each by mouth daily.  Dispense: 350 g; Refill: 11 - CMP14+EGFR  10. Annual physical exam - CMP14+EGFR - Lipid panel - Thyroid Panel With TSH - Vit D  25 hydroxy (rtn osteoporosis monitoring) - Anemia Profile B  11. Oral thrush - nystatin (MYCOSTATIN) 100000 UNIT/ML suspension; Take 5 mLs (500,000 Units total) by mouth 4 (four) times daily.  Dispense: 240 mL; Refill: 1   Continue all meds Labs pending Health Maintenance reviewed Diet and  exercise encouraged RTO 1 year  [name], FNP

## 2014-10-03 ENCOUNTER — Other Ambulatory Visit: Payer: Self-pay | Admitting: Family

## 2014-10-03 DIAGNOSIS — E781 Pure hyperglyceridemia: Secondary | ICD-10-CM | POA: Insufficient documentation

## 2014-10-03 LAB — ANEMIA PROFILE B
Basophils Absolute: 0 10*3/uL (ref 0.0–0.2)
Basos: 0 %
EOS (ABSOLUTE): 0.2 10*3/uL (ref 0.0–0.4)
Eos: 1 %
Ferritin: 102 ng/mL (ref 15–150)
Folate: 4.1 ng/mL (ref >3.0)
Hematocrit: 42.7 % (ref 34.0–46.6)
Hemoglobin: 14.4 g/dL (ref 11.1–15.9)
Immature Grans (Abs): 0 10*3/uL (ref 0.0–0.1)
Immature Granulocytes: 0 %
Iron Saturation: 20 % (ref 15–55)
Iron: 78 ug/dL (ref 27–159)
Lymphocytes Absolute: 3.4 10*3/uL — ABNORMAL HIGH (ref 0.7–3.1)
Lymphs: 23 %
MCH: 31.1 pg (ref 26.6–33.0)
MCHC: 33.7 g/dL (ref 31.5–35.7)
MCV: 92 fL (ref 79–97)
Monocytes Absolute: 0.6 10*3/uL (ref 0.1–0.9)
Monocytes: 4 %
Neutrophils Absolute: 10.1 10*3/uL — ABNORMAL HIGH (ref 1.4–7.0)
Neutrophils: 72 %
Platelets: 287 10*3/uL (ref 150–379)
RBC: 4.63 x10E6/uL (ref 3.77–5.28)
RDW: 13.4 % (ref 12.3–15.4)
Retic Ct Pct: 1.3 % (ref 0.6–2.6)
Total Iron Binding Capacity: 388 ug/dL (ref 250–450)
UIBC: 310 ug/dL (ref 131–425)
Vitamin B-12: 421 pg/mL (ref 211–946)
WBC: 14.3 10*3/uL — ABNORMAL HIGH (ref 3.4–10.8)

## 2014-10-03 LAB — CMP14+EGFR
ALBUMIN: 4.4 g/dL (ref 3.5–5.5)
ALT: 16 IU/L (ref 0–32)
AST: 15 IU/L (ref 0–40)
Albumin/Globulin Ratio: 1.8 (ref 1.1–2.5)
Alkaline Phosphatase: 135 IU/L — ABNORMAL HIGH (ref 39–117)
BUN / CREAT RATIO: 23 (ref 9–23)
BUN: 14 mg/dL (ref 6–24)
Bilirubin Total: <0.2 mg/dL (ref 0.0–1.2)
CALCIUM: 9.6 mg/dL (ref 8.7–10.2)
CO2: 24 mmol/L (ref 18–29)
CREATININE: 0.61 mg/dL (ref 0.57–1.00)
Chloride: 95 mmol/L — ABNORMAL LOW (ref 97–108)
GFR calc Af Amer: 125 mL/min/{1.73_m2} (ref >59)
GFR, EST NON AFRICAN AMERICAN: 108 mL/min/{1.73_m2} (ref >59)
GLOBULIN, TOTAL: 2.4 g/dL (ref 1.5–4.5)
Glucose: 93 mg/dL (ref 65–99)
Potassium: 4.1 mmol/L (ref 3.5–5.2)
SODIUM: 138 mmol/L (ref 134–144)
Total Protein: 6.8 g/dL (ref 6.0–8.5)

## 2014-10-03 LAB — VITAMIN D 25 HYDROXY (VIT D DEFICIENCY, FRACTURES): Vit D, 25-Hydroxy: 19.2 ng/mL — ABNORMAL LOW (ref 30.0–100.0)

## 2014-10-03 LAB — LIPID PANEL
Chol/HDL Ratio: 7.3 ratio units — ABNORMAL HIGH (ref 0.0–4.4)
Cholesterol, Total: 254 mg/dL — ABNORMAL HIGH (ref 100–199)
HDL: 35 mg/dL — ABNORMAL LOW (ref >39)
Triglycerides: 418 mg/dL — ABNORMAL HIGH (ref 0–149)

## 2014-10-03 LAB — THYROID PANEL WITH TSH
Free Thyroxine Index: 2 (ref 1.2–4.9)
T3 Uptake Ratio: 29 % (ref 24–39)
T4, Total: 6.9 ug/dL (ref 4.5–12.0)
TSH: 1.17 u[IU]/mL (ref 0.450–4.500)

## 2014-10-03 MED ORDER — FENOFIBRATE 145 MG PO TABS: 145.0000 mg | ORAL_TABLET | Freq: Every day | ORAL | Status: DC

## 2014-10-03 MED ORDER — VITAMIN D (ERGOCALCIFEROL) 1.25 MG (50000 UNIT) PO CAPS: 50000.0000 [IU] | ORAL_CAPSULE | ORAL | Status: DC

## 2014-10-04 ENCOUNTER — Other Ambulatory Visit: Payer: Self-pay | Admitting: Obstetrics and Gynecology

## 2014-10-04 ENCOUNTER — Ambulatory Visit (INDEPENDENT_AMBULATORY_CARE_PROVIDER_SITE_OTHER): Payer: Medicaid Other | Admitting: Obstetrics and Gynecology

## 2014-10-04 ENCOUNTER — Encounter: Payer: Self-pay | Admitting: Obstetrics and Gynecology

## 2014-10-04 VITALS — BP 130/82 | Ht 63.0 in | Wt 147.0 lb

## 2014-10-04 DIAGNOSIS — Z3200 Encounter for pregnancy test, result unknown: Secondary | ICD-10-CM

## 2014-10-04 DIAGNOSIS — Z3202 Encounter for pregnancy test, result negative: Secondary | ICD-10-CM | POA: Diagnosis not present

## 2014-10-04 DIAGNOSIS — R87612 Low grade squamous intraepithelial lesion on cytologic smear of cervix (LGSIL): Secondary | ICD-10-CM

## 2014-10-04 LAB — POCT URINE PREGNANCY: Preg Test, Ur: NEGATIVE

## 2014-10-04 NOTE — Progress Notes (Signed)
Patient ID: Penny Moreno, female   DOB: 28-Feb-1965, 49 y.o.   MRN: [id number] Pt here today for a LEEP.

## 2014-10-04 NOTE — Addendum Note (Signed)
Addended by: Farley Ly on: 10/04/2014 03:33 PM   Modules accepted: Orders

## 2014-10-04 NOTE — Progress Notes (Signed)
Patient ID: Penny Moreno, female   DOB: 1965-06-02, 49 y.o.   MRN: [id number]   GYNECOLOGY CLINIC PROCEDURE NOTE:  LEEP  Penny Moreno is a 49 y.o. [id number] here for LEEP. No GYN concerns. Pap smear and colposcopy reviewed. Patient reports her LMP occurred over 2 years ago.  Pap : ATYPICAL SQUAMOUS CELLS OF UNDETERMINED SIGNIFICANCE (ASC-US), per results on 04/30/2014. Colpo Biopsy : HIGH GRADE SQUAMOUS INTRAEPITHELIAL LESION, CIN-II (MODERATE DYSPLASIA), per results on 07/04/2014.  - ASSOCIATED LOW GRADE SQUAMOUS INTRAEPITHELIAL LESION, CIN-I (MILD DYSPLASIA). ECC : Benign, per results on 07/04/2014.  Risks, benefits, alternatives, and limitations of procedure explained to patient, including pain, bleeding, infection, failure to remove abnormal tissue and failure to cure dysplasia, need for repeat procedures, damage to pelvic organs, cervical incompetence.  Role of HPV,cervical dysplasia and need for close followup was empasized. Informed written consent was obtained. All questions were answered. Time out performed.  ??Procedure: The patient was placed in lithotomy position and the bivalved coated speculum was placed in the patient's vagina. A grounding pad placed on the patient. Lugol's solution was applied to the cervix and areas of decreased uptake were noted around the transformation zone.   Local anesthesia was administered via an intracervical block using 10cc of 2% Lidocaine with epinephrine. The suction was turned on and the 8 mm x 1 cm 1X [facility] Biopsy Excisor on 50 Watts of cutting current was used to excise the area of decreased uptake and excise the entire transformation zone. Excellent hemostasis was achieved . Monsel's solution was then applied and the speculum was removed from the vagina. Specimens were sent to pathology.  ?The patient tolerated the procedure well. Post-operative instructions given to patient, including instruction to seek medical attention for persistent bright red  bleeding, fever, abdominal/pelvic pain, dysuria, nausea or vomiting. She was also told about the possibility of having copious yellow to black tinged discharge for weeks. She was counseled to avoid anything in the vagina (sex/douching/tampons) for 3 weeks. She has a 4 week post-operative check to assess wound healing, review results and discuss further management.  Results to be placed on my  Chart, and offer to call pt to discuss. Pt states she doesn't check her MyChart.   This chart was SCRIBED for [name], MD by Stephania Fragmin, ED Scribe. This patient was seen in room 2, and the patient's care was started at 2:22 PM. Assisted by AMT for procedure.  I personally performed the services described in this documentation, which was SCRIBED in my presence. The recorded information has been reviewed and considered accurate. It has been edited as necessary during review. Jonnie Kind, MD

## 2014-10-16 ENCOUNTER — Telehealth: Payer: Self-pay | Admitting: *Deleted

## 2014-10-16 NOTE — Telephone Encounter (Signed)
Pt informed of Leep Procedure from 10/04/2014. Pt state read the results through myChart.Pt informed she will need to continue yearly pap's. Pt states having a charcoal discharge with odor, no fever. Pt informed normal continue to monitor and no sex until after her f/u appt. Pt verbalized understanding.

## 2014-10-16 NOTE — Telephone Encounter (Signed)
-----   Message from Jonnie Kind, MD sent at 10/15/2014  5:21 AM EDT ----- CIN I in the final pathology report. Less severe than prior biopsies.  Additionally, surgical margins are CLEAR of any identified dysplasia. Will allow cervix to heal and Penny Moreno can be followed with yearly PAPs.  She will not be able to go to paps every 3-5 years

## 2014-10-19 ENCOUNTER — Encounter: Payer: Self-pay | Admitting: Family Medicine

## 2014-10-19 ENCOUNTER — Ambulatory Visit (INDEPENDENT_AMBULATORY_CARE_PROVIDER_SITE_OTHER): Payer: Medicaid Other | Admitting: Family Medicine

## 2014-10-19 VITALS — BP 115/78 | HR 96 | Temp 98.0°F | Ht 63.0 in | Wt 159.2 lb

## 2014-10-19 DIAGNOSIS — M7542 Impingement syndrome of left shoulder: Secondary | ICD-10-CM | POA: Diagnosis not present

## 2014-10-19 MED ORDER — LIDOCAINE HCL 1 % IJ SOLN
1.0000 mL | Freq: Once | INTRAMUSCULAR | Status: AC
  Administered 2014-10-19: 1 mL

## 2014-10-19 MED ORDER — METHYLPREDNISOLONE ACETATE 40 MG/ML IJ SUSP
40.0000 mg | Freq: Once | INTRAMUSCULAR | Status: AC
  Administered 2014-10-19: 40 mg via INTRA_ARTICULAR

## 2014-10-19 NOTE — Progress Notes (Signed)
BP 115/78 mmHg  Pulse 96  Temp(Src) 98 F (36.7 C) (Oral)  Ht 5\' 3"  (1.6 m)  Wt 159 lb 3.2 oz (72.213 kg)  BMI 28.21 kg/m2  LMP 09/20/2011   Subjective:    Patient ID: Penny Moreno, female    DOB: [birth year], 49 y.o.   MRN: [id number]  HPI: Penny Moreno is a 49 y.o. female presenting on 10/19/2014 for Shoulder Pain   HPI Shoulder pain Patient has had left shoulder pain and difficulty with raising and overhead for the past year to periodically. Pain is worse with overhead movements and has recently worsened over the past couple weeks. She sees a rheumatologist who has done injections shoulder before about twice this year. She denies any tingling or numbness in the hand on that side or any neck pain on that side. She has full range of motion.  Relevant past medical, surgical, family and social history reviewed and updated as indicated. Interim medical history since our last visit reviewed. Allergies and medications reviewed and updated.  Review of Systems  Constitutional: Negative for fever and chills.  HENT: Negative for congestion, ear discharge and ear pain.   Eyes: Negative for redness and visual disturbance.  Respiratory: Negative for chest tightness and shortness of breath.   Cardiovascular: Negative for chest pain and leg swelling.  Genitourinary: Negative for dysuria and difficulty urinating.  Musculoskeletal: Positive for arthralgias. Negative for myalgias, back pain, joint swelling, gait problem, neck pain and neck stiffness.  Skin: Negative for rash.  Neurological: Negative for light-headedness and headaches.  Psychiatric/Behavioral: Negative for behavioral problems and agitation.  All other systems reviewed and are negative.   Per HPI unless specifically indicated above     Medication List       This list is accurate as of: 10/19/14  3:12 PM.  Always use your most recent med list.               ALPRAZolam 0.5 MG tablet  Commonly known as:  XANAX   Take 1 tablet (0.5 mg total) by mouth daily as needed. For anxiety     BENEFIBER Powd  Take 2 each by mouth daily.     BETA CAROTENE PO  Take by mouth daily.     BIOTIN PO  Take by mouth daily.     busPIRone 15 MG tablet  Commonly known as:  BUSPAR  Take 15 mg by mouth.     cyclobenzaprine 10 MG tablet  Commonly known as:  FLEXERIL  Take 1 tablet (10 mg total) by mouth 3 (three) times daily as needed for muscle spasms.     fenofibrate 145 MG tablet  Commonly known as:  TRICOR  Take 1 tablet (145 mg total) by mouth daily.     gabapentin 300 MG capsule  Commonly known as:  NEURONTIN  Take 1 capsule (300 mg total) by mouth at bedtime.     ibuprofen 800 MG tablet  Commonly known as:  ADVIL,MOTRIN  Take 1 tablet (800 mg total) by mouth every 6 (six) hours as needed.     omeprazole 20 MG capsule  Commonly known as:  PRILOSEC  Take 1 capsule (20 mg total) by mouth daily.     Pitavastatin Calcium 2 MG Tabs  Commonly known as:  LIVALO  Take 1 tablet (2 mg total) by mouth 1 day or 1 dose.     PROBIOTIC PEARLS PO  Take 1 capsule by mouth daily.     PROZAC PO  Take 60 mg by mouth at bedtime.     REXULTI PO  Take by mouth daily.     vitamin C 100 MG tablet  Take 100 mg by mouth daily.     Vitamin D (Ergocalciferol) 50000 UNITS Caps capsule  Commonly known as:  DRISDOL  Take 1 capsule (50,000 Units total) by mouth every 7 (seven) days.     vitamin E 100 UNIT capsule  Take 100 Units by mouth daily.     ZETIA 10 MG tablet  Generic drug:  ezetimibe  TAKE 1 TABLET (10 MG TOTAL) BY MOUTH DAILY.           Objective:    BP 115/78 mmHg  Pulse 96  Temp(Src) 98 F (36.7 C) (Oral)  Ht 5\' 3"  (1.6 m)  Wt 159 lb 3.2 oz (72.213 kg)  BMI 28.21 kg/m2  LMP 09/20/2011  Wt Readings from Last 3 Encounters:  10/19/14 159 lb 3.2 oz (72.213 kg)  10/04/14 147 lb (66.679 kg)  10/02/14 147 lb 12.8 oz (67.042 kg)    Physical Exam  Constitutional: She is oriented to person,  place, and time. She appears well-developed and well-nourished. No distress.  Eyes: Conjunctivae and EOM are normal. Pupils are equal, round, and reactive to light.  Cardiovascular: Normal rate, regular rhythm and normal heart sounds.   No murmur heard. Pulmonary/Chest: Effort normal and breath sounds normal. No respiratory distress. She has no wheezes.  Musculoskeletal: Normal range of motion. She exhibits no edema.       Left shoulder: She exhibits tenderness (tenderness under subacromial process posteriorly) and pain. She exhibits normal range of motion, no bony tenderness, no swelling, no effusion, no crepitus, no deformity and normal strength.  Neurological: She is alert and oriented to person, place, and time. Coordination normal.  Skin: Skin is warm and dry. No rash noted. She is not diaphoretic.  Psychiatric: She has a normal mood and affect. Her behavior is normal.  Vitals reviewed.   Results for orders placed or performed in visit on 10/04/14  POCT urine pregnancy  Result Value Ref Range   Preg Test, Ur Negative Negative   Shoulder injection: Left shoulder. Site prepped with Betadine, used posterior entry of subacromial injection. Patient tolerated well. Injection of 40 mg of Depo-Medrol and 1 mL of lidocaine.    Assessment & Plan:   Problem List Items Addressed This Visit    None    Visit Diagnoses    Shoulder impingement, left    -  Primary    Based on her exam she has impingement syndrome or rotator cuff damage. Performed injection and sent to physical therapy and MRI    Relevant Medications    methylPREDNISolone acetate (DEPO-MEDROL) injection 40 mg (Completed)    lidocaine (XYLOCAINE) 1 % (with pres) injection 1 mL (Completed)    Other Relevant Orders    Ambulatory referral to Physical Therapy    MR Shoulder Left Wo Contrast        Follow up plan: Return if symptoms worsen or fail to improve.  Caryl Pina, MD [facility] 10/19/2014,  3:12 PM

## 2014-10-19 NOTE — Patient Instructions (Signed)

## 2014-10-31 ENCOUNTER — Ambulatory Visit (INDEPENDENT_AMBULATORY_CARE_PROVIDER_SITE_OTHER): Payer: Medicaid Other

## 2014-10-31 ENCOUNTER — Other Ambulatory Visit: Payer: Self-pay

## 2014-10-31 ENCOUNTER — Encounter: Payer: Self-pay | Admitting: Obstetrics and Gynecology

## 2014-10-31 ENCOUNTER — Ambulatory Visit (INDEPENDENT_AMBULATORY_CARE_PROVIDER_SITE_OTHER): Payer: Medicaid Other | Admitting: Obstetrics and Gynecology

## 2014-10-31 ENCOUNTER — Telehealth: Payer: Self-pay | Admitting: Family Medicine

## 2014-10-31 VITALS — BP 110/70 | Ht 63.0 in | Wt 150.5 lb

## 2014-10-31 DIAGNOSIS — Z23 Encounter for immunization: Secondary | ICD-10-CM | POA: Diagnosis not present

## 2014-10-31 DIAGNOSIS — Z9889 Other specified postprocedural states: Secondary | ICD-10-CM

## 2014-10-31 DIAGNOSIS — N870 Mild cervical dysplasia: Secondary | ICD-10-CM

## 2014-10-31 NOTE — Progress Notes (Signed)
Patient ID: Penny Moreno, female   DOB: Oct 27, 1965, 49 y.o.   MRN: [id number] Pt here today for post procedure visit. Pt is S/P LEEP procedure. Pt denies any problems or concerns at this time.

## 2014-10-31 NOTE — Telephone Encounter (Signed)
Meloxicam 7.5 mg, one PO daily x 30 days, with no refills called in to [facility].  Patient informed

## 2014-10-31 NOTE — Progress Notes (Signed)
Patient ID: Penny Moreno, female   DOB: 04/21/1965, 49 y.o.   MRN: [id number]  By signing my name below, I, Erling Conte, attest that this documentation has been prepared under the direction and in the presence of Jonnie Kind, MD. Electronically Signed: Erling Conte, ED Scribe. 10/31/2014. 2:16 PM.  Subjective:  Penny Moreno is a 49 y.o. female now 4 weeks status post LEEP. She denies any complaints at this time  Pathology: cin I with clear margins  Review of Systems Negative    Diet:  Non restrictions   Bowel movements : normal.  The patient is not having any pain.  Objective:  BP 110/70 mmHg  Ht 5\' 3"  (1.6 m)  Wt 150 lb 8 oz (68.266 kg)  BMI 26.67 kg/m2  LMP 09/20/2011 General:Well developed, well nourished.  No acute distress. Abdomen: Bowel sounds normal, soft, non-tender. Pelvic Exam:    External Genitalia:  Normal.    Vagina: normal    Cervix: Well healed, s/p LEEP. No inflammation or tenderness    Uterus: Normal    Adnexa/Bimanual: Normal    Assessment:  Post-Op 4 weeks s/p LEEP , CIN I with clear margins  Plan:   1.Follow up in 1 year for annual gyn exam.  2. Will nEED PAP SMEAR WITH COTESTING ANNUALLY X 20 YR    I personally performed the services described in this documentation, which was SCRIBED in my presence. The recorded information has been reviewed and considered accurate. It has been edited as necessary during review. Jonnie Kind, MD

## 2014-10-31 NOTE — Telephone Encounter (Signed)
The conservative therapy that they want [place] to go through is anti-inflammatories. We can send her Aloxi CAM 7.5 mg daily 30 days Caryl Pina, MD [facility] 10/31/2014, 3:38 PM

## 2014-11-01 ENCOUNTER — Ambulatory Visit: Payer: Medicaid Other | Admitting: Obstetrics and Gynecology

## 2014-11-10 DIAGNOSIS — N870 Mild cervical dysplasia: Secondary | ICD-10-CM | POA: Insufficient documentation

## 2014-12-20 ENCOUNTER — Encounter: Payer: Self-pay | Admitting: Family Medicine

## 2014-12-20 ENCOUNTER — Ambulatory Visit (INDEPENDENT_AMBULATORY_CARE_PROVIDER_SITE_OTHER): Payer: Medicaid Other | Admitting: Family Medicine

## 2014-12-20 VITALS — BP 132/80 | HR 92 | Temp 98.0°F | Ht 63.0 in | Wt 157.2 lb

## 2014-12-20 DIAGNOSIS — M5431 Sciatica, right side: Secondary | ICD-10-CM | POA: Diagnosis not present

## 2014-12-20 DIAGNOSIS — M7542 Impingement syndrome of left shoulder: Secondary | ICD-10-CM | POA: Diagnosis not present

## 2014-12-20 NOTE — Progress Notes (Signed)
BP 132/80 mmHg  Pulse 92  Temp(Src) 98 F (36.7 C) (Oral)  Ht 5\' 3"  (1.6 m)  Wt 157 lb 3.2 oz (71.305 kg)  BMI 27.85 kg/m2  LMP 09/20/2011   Subjective:    Patient ID: Penny Moreno, female    DOB: [birth year], 49 y.o.   MRN: [id number]  HPI: Penny Moreno is a 49 y.o. female presenting on 12/20/2014 for Shoulder Pain and Hip Pain   HPI Left shoulder pain Patient had left shoulder injection and has been using meloxicam 7.5 mg daily for the past 2 months without improvement. She has also had this shoulder pain before in her life and has had shoulder injections for it previously and is never had any imaging like an MRI. The pain is still causing her the same symptoms as pain with overhead movement on that side and weakness with overhead movement. Pain is also causing her difficulties with sleep. Pain is 10 out of 10 at its worst.  Sciatic pain, right Patient has been having right sciatic pain which she describes as a sharp pain shooting down from her hip all the way down the back of her thigh. She also describes some numbness from that is coming down the right side of her leg. The numbness is new issues had sciatic pain periodically before. The pain from her sciatica is 8 out of 10. She's been using her Mobic for that as well and has been getting minimal benefit from it.  Relevant past medical, surgical, family and social history reviewed and updated as indicated. Interim medical history since our last visit reviewed. Allergies and medications reviewed and updated.  Review of Systems  Constitutional: Negative for fever and chills.  HENT: Negative for congestion, ear discharge and ear pain.   Eyes: Negative for redness and visual disturbance.  Respiratory: Negative for chest tightness and shortness of breath.   Cardiovascular: Negative for chest pain and leg swelling.  Genitourinary: Negative for dysuria and difficulty urinating.  Musculoskeletal: Positive for arthralgias.  Negative for back pain and gait problem.  Skin: Negative for rash.  Neurological: Positive for weakness and numbness. Negative for light-headedness and headaches.  Psychiatric/Behavioral: Negative for behavioral problems and agitation.  All other systems reviewed and are negative.   Per HPI unless specifically indicated above     Medication List       This list is accurate as of: 12/20/14  2:19 PM.  Always use your most recent med list.               ALPRAZolam 0.5 MG tablet  Commonly known as:  XANAX  Take 1 tablet (0.5 mg total) by mouth daily as needed. For anxiety     BENEFIBER Powd  Take 2 each by mouth daily.     BETA CAROTENE PO  Take by mouth daily.     BIOTIN PO  Take by mouth daily.     cyclobenzaprine 10 MG tablet  Commonly known as:  FLEXERIL  Take 1 tablet (10 mg total) by mouth 3 (three) times daily as needed for muscle spasms.     fenofibrate 145 MG tablet  Commonly known as:  TRICOR  Take 1 tablet (145 mg total) by mouth daily.     gabapentin 300 MG capsule  Commonly known as:  NEURONTIN  Take 1 capsule (300 mg total) by mouth at bedtime.     ibuprofen 800 MG tablet  Commonly known as:  ADVIL,MOTRIN  Take 1 tablet (800 mg  total) by mouth every 6 (six) hours as needed.     meloxicam 7.5 MG tablet  Commonly known as:  MOBIC  Take 7.5 mg by mouth daily.     omeprazole 20 MG capsule  Commonly known as:  PRILOSEC  Take 1 capsule (20 mg total) by mouth daily.     Pitavastatin Calcium 2 MG Tabs  Commonly known as:  LIVALO  Take 1 tablet (2 mg total) by mouth 1 day or 1 dose.     PROBIOTIC PEARLS PO  Take 1 capsule by mouth daily.     REXULTI PO  Take by mouth daily.     vitamin C 100 MG tablet  Take 100 mg by mouth daily.     Vitamin D3 5000 UNITS Tabs  Take by mouth. Two tablets per day     vitamin E 100 UNIT capsule  Take 100 Units by mouth daily.           Objective:    BP 132/80 mmHg  Pulse 92  Temp(Src) 98 F (36.7 C)  (Oral)  Ht 5\' 3"  (1.6 m)  Wt 157 lb 3.2 oz (71.305 kg)  BMI 27.85 kg/m2  LMP 09/20/2011  Wt Readings from Last 3 Encounters:  12/20/14 157 lb 3.2 oz (71.305 kg)  10/31/14 150 lb 8 oz (68.266 kg)  10/19/14 159 lb 3.2 oz (72.213 kg)    Physical Exam  Constitutional: She is oriented to person, place, and time. She appears well-developed and well-nourished. No distress.  Eyes: Conjunctivae and EOM are normal. Pupils are equal, round, and reactive to light.  Cardiovascular: Normal rate, regular rhythm and normal heart sounds.   No murmur heard. Pulmonary/Chest: Effort normal and breath sounds normal. No respiratory distress. She has no wheezes.  Musculoskeletal: Normal range of motion. She exhibits no edema.       Left shoulder: She exhibits tenderness (tenderness under subacromial process posteriorly) and pain. She exhibits normal range of motion, no bony tenderness, no swelling, no effusion, no crepitus, no deformity and normal strength.       Lumbar back: She exhibits normal range of motion, no tenderness, no bony tenderness, no swelling, no edema, no pain and no spasm.  Negative straight leg raise bilaterally  Neurological: She is alert and oriented to person, place, and time. Coordination normal.  Skin: Skin is warm and dry. No rash noted. She is not diaphoretic.  Psychiatric: She has a normal mood and affect. Her behavior is normal.  Vitals reviewed.   Results for orders placed or performed in visit on 10/04/14  POCT urine pregnancy  Result Value Ref Range   Preg Test, Ur Negative Negative      Assessment & Plan:   Problem List Items Addressed This Visit    None    Visit Diagnoses    Shoulder impingement syndrome, left    -  Primary    Relevant Orders    Ambulatory referral to Orthopedic Surgery    Sciatic pain, right        says numbness down leg    Relevant Orders    Ambulatory referral to Orthopedic Surgery    MR Shoulder Left Wo Contrast        Follow up  plan: Return if symptoms worsen or fail to improve.  Counseling provided for all of the vaccine components Orders Placed This Encounter  Procedures  . MR Shoulder Left Wo Contrast  . Ambulatory referral to [place] Dettinger, MD  [place] 12/20/2014, 2:19 PM

## 2015-02-25 ENCOUNTER — Telehealth: Payer: Self-pay | Admitting: Family

## 2015-02-25 DIAGNOSIS — K219 Gastro-esophageal reflux disease without esophagitis: Secondary | ICD-10-CM

## 2015-02-25 MED ORDER — OMEPRAZOLE 20 MG PO CPDR: 40.0000 mg | DELAYED_RELEASE_CAPSULE | Freq: Every day | ORAL | Status: DC

## 2015-02-25 NOTE — Telephone Encounter (Signed)
Patient says she went to ENT several years back and he put her on two omeprazole daily so that is what her script should be.  She had a bottle  with the provider's name and dose but must have thrown it away.

## 2015-02-25 NOTE — Telephone Encounter (Signed)
Prescription sent to pharmacy.

## 2015-02-25 NOTE — Telephone Encounter (Signed)
Pt aware.

## 2015-05-21 ENCOUNTER — Other Ambulatory Visit (HOSPITAL_COMMUNITY)
Admission: RE | Admit: 2015-05-21 | Discharge: 2015-05-21 | Disposition: A | Discharge status: Home / Self Care | Payer: Medicaid Other | Source: Ambulatory Visit | Attending: Obstetrics and Gynecology | Admitting: Obstetrics and Gynecology

## 2015-05-21 ENCOUNTER — Encounter: Payer: Self-pay | Admitting: Obstetrics and Gynecology

## 2015-05-21 ENCOUNTER — Ambulatory Visit (INDEPENDENT_AMBULATORY_CARE_PROVIDER_SITE_OTHER): Payer: Medicaid Other | Admitting: Obstetrics and Gynecology

## 2015-05-21 VITALS — BP 120/82 | Ht 63.0 in | Wt 147.5 lb

## 2015-05-21 DIAGNOSIS — Z01411 Encounter for gynecological examination (general) (routine) with abnormal findings: Secondary | ICD-10-CM | POA: Insufficient documentation

## 2015-05-21 DIAGNOSIS — Z01419 Encounter for gynecological examination (general) (routine) without abnormal findings: Secondary | ICD-10-CM | POA: Diagnosis not present

## 2015-05-21 DIAGNOSIS — Z0000 Encounter for general adult medical examination without abnormal findings: Secondary | ICD-10-CM | POA: Diagnosis not present

## 2015-05-21 DIAGNOSIS — Z1151 Encounter for screening for human papillomavirus (HPV): Secondary | ICD-10-CM | POA: Insufficient documentation

## 2015-05-21 NOTE — Progress Notes (Signed)
Patient ID: Penny Moreno, female   DOB: [birth year], 50 y.o.   MRN: [id number]  Assessment:  Annual Gyn Exam   Plan:  1. pap smear done, next pap due 3 yr 2. return annually or prn 3    Annual mammogram advised Subjective:  Penny Moreno is a 50 y.o. female G2P2002 who presents for annual exam. Patient's last menstrual period was 09/20/2011. The patient has complaints today of none.  The following portions of the patient's history were reviewed and updated as appropriate: allergies, current medications, past family history, past medical history, past social history, past surgical history and problem list. Past Medical History  Diagnosis Date  . Anxiety   . Depression   . Arthritis   . OCD (obsessive compulsive disorder)   . Eating disorder   . Panic disorder   . Neuromuscular disorder (HCC)     Fibromyalgia  . Bipolar disorder ([place])   . Vaginal Pap smear, abnormal   . GERD (gastroesophageal reflux disease)   . Frozen shoulder   . Back pain     Past Surgical History  Procedure Laterality Date  . Diagnostic laparoscopy      [place]  . Tonsillectomy    . Laparoscopic tubal ligation  10/30/2011    Procedure: LAPAROSCOPIC TUBAL LIGATION;  Surgeon: Jonnie Kind, MD;  Location: [facility];  Service: Gynecology;  Laterality: Bilateral;  Laparoscopic Bilateral tubal ligation with falope rings  . Tubal ligation  10/04/2011     Current outpatient prescriptions:  .  ALPRAZolam (XANAX) 0.5 MG tablet, Take 1 tablet (0.5 mg total) by mouth daily as needed. For anxiety (Patient taking differently: Take 0.5 mg by mouth 2 (two) times daily as needed. For anxiety), Disp: 30 tablet, Rfl: 0 .  BETA CAROTENE PO, Take by mouth daily., Disp: , Rfl:  .  BIOTIN PO, Take by mouth daily., Disp: , Rfl:  .  Brexpiprazole (REXULTI PO), Take by mouth daily., Disp: , Rfl:  .  Cholecalciferol (VITAMIN D3) 5000 UNITS TABS, Take by mouth. Two tablets per day, Disp: , Rfl:  .  cyclobenzaprine  (FLEXERIL) 10 MG tablet, Take 1 tablet (10 mg total) by mouth 3 (three) times daily as needed for muscle spasms., Disp: 90 tablet, Rfl: 6 .  fenofibrate (TRICOR) 145 MG tablet, Take 1 tablet (145 mg total) by mouth daily., Disp: 90 tablet, Rfl: 1 .  gabapentin (NEURONTIN) 300 MG capsule, Take 1 capsule (300 mg total) by mouth at bedtime., Disp: 90 capsule, Rfl: 3 .  ibuprofen (ADVIL,MOTRIN) 800 MG tablet, Take 1 tablet (800 mg total) by mouth every 6 (six) hours as needed., Disp: 30 tablet, Rfl: 3 .  omeprazole (PRILOSEC) 20 MG capsule, Take 2 capsules (40 mg total) by mouth daily., Disp: 180 capsule, Rfl: 3 .  Probiotic Product (PROBIOTIC PEARLS PO), Take 1 capsule by mouth daily., Disp: , Rfl:  .  vitamin E 100 UNIT capsule, Take 100 Units by mouth daily., Disp: , Rfl:  .  Wheat Dextrin (BENEFIBER) POWD, Take 2 each by mouth daily., Disp: 350 g, Rfl: 11 .  Ascorbic Acid (VITAMIN C) 100 MG tablet, Take 100 mg by mouth daily. Reported on 05/21/2015, Disp: , Rfl:  .  meloxicam (MOBIC) 7.5 MG tablet, Take 7.5 mg by mouth daily. Reported on 05/21/2015, Disp: , Rfl:   Review of Systems Constitutional: positive for frozen shoulder, unable to undress , has surgery in near future. Gastrointestinal: negative Genitourinary: neg No weight gain / loss.  Objective:  BP 120/82 mmHg  Ht 5\' 3"  (1.6 m)  Wt 147 lb 8 oz (66.906 kg)  BMI 26.14 kg/m2  LMP 09/20/2011   BMI: Body mass index is 26.14 kg/(m^2).  General Appearance: Alert, appropriate appearance for age. No acute distress HEENT: Grossly normal Neck / Thyroid:  Cardiovascular: RRR; normal S1, S2, no murmur Lungs: CTA bilaterally Back: No CVAT Breast Exam: No masses or nodes.No dimpling, nipple retraction or discharge. Gastrointestinal: Soft, non-tender, no masses or organomegaly Pelvic Exam: Vulva and vagina appear normal. Bimanual exam reveals normal uterus and adnexa. Rectovaginal: normal rectal, no masses and guaiac negative stool  obtained Lymphatic Exam: Non-palpable nodes in neck, clavicular, axillary, or inguinal regions Skin: no rash or abnormalities Neurologic: Normal gait and speech, no tremor  Psychiatric: Alert and oriented, appropriate affect.  Urinalysis:Not done  [name]. MD P[facility] [phone] 4:41 PM

## 2015-05-21 NOTE — Progress Notes (Signed)
Patient ID: Penny Moreno, female   DOB: [birth year], 50 y.o.   MRN: [id number] Pt here today for annual exam. Pt denies any problems or concerns at this time.

## 2015-05-22 LAB — CYTOLOGY - PAP

## 2015-05-23 ENCOUNTER — Ambulatory Visit (INDEPENDENT_AMBULATORY_CARE_PROVIDER_SITE_OTHER): Payer: Medicaid Other | Admitting: Family Medicine

## 2015-05-23 ENCOUNTER — Encounter: Payer: Self-pay | Admitting: Family Medicine

## 2015-05-23 VITALS — BP 132/87 | HR 104 | Temp 98.2°F | Ht 63.0 in | Wt 148.0 lb

## 2015-05-23 DIAGNOSIS — M797 Fibromyalgia: Secondary | ICD-10-CM

## 2015-05-23 DIAGNOSIS — M545 Low back pain, unspecified: Secondary | ICD-10-CM

## 2015-05-23 DIAGNOSIS — Z01818 Encounter for other preprocedural examination: Secondary | ICD-10-CM

## 2015-05-23 DIAGNOSIS — K219 Gastro-esophageal reflux disease without esophagitis: Secondary | ICD-10-CM

## 2015-05-23 DIAGNOSIS — E785 Hyperlipidemia, unspecified: Secondary | ICD-10-CM | POA: Diagnosis not present

## 2015-05-23 DIAGNOSIS — J339 Nasal polyp, unspecified: Secondary | ICD-10-CM

## 2015-05-23 DIAGNOSIS — G8929 Other chronic pain: Secondary | ICD-10-CM

## 2015-05-23 MED ORDER — FENOFIBRATE 145 MG PO TABS: 145.0000 mg | ORAL_TABLET | Freq: Every day | ORAL | Status: DC

## 2015-05-23 MED ORDER — OMEPRAZOLE 20 MG PO CPDR: 40.0000 mg | DELAYED_RELEASE_CAPSULE | Freq: Every day | ORAL | Status: DC

## 2015-05-23 MED ORDER — FLUTICASONE PROPIONATE 50 MCG/ACT NA SUSP: 1.0000 | Freq: Two times a day (BID) | NASAL | Status: DC | PRN

## 2015-05-23 MED ORDER — GABAPENTIN 300 MG PO CAPS: 300.0000 mg | ORAL_CAPSULE | Freq: Every day | ORAL | Status: DC

## 2015-05-23 NOTE — Progress Notes (Signed)
BP 132/87 mmHg  Pulse 104  Temp(Src) 98.2 F (36.8 C) (Oral)  Ht 5' 3"  (1.6 m)  Wt 148 lb (67.132 kg)  BMI 26.22 kg/m2  LMP 09/20/2011   Subjective:    Patient ID: Penny Moreno, female    DOB: Sep 09, 1965, 50 y.o.   MRN: [id number]  HPI: Penny Moreno is a 50 y.o. female presenting on 05/23/2015 for Surgical clearance and Annual Exam   HPI Preoperative clearance examination Patient is coming in today for a preoperative clearance for shoulder pain and a surgery for her shoulder. She fits in the low to moderate risk category because of her hyperlipidemia and age. She denies any chest pain or shortness of breath. He denies any chest pain, shortness of breath, headaches or vision issues, abdominal complaints, diarrhea, nausea, vomiting, or joint issues.   Hyperlipidemia Patient is coming in for refill of his cholesterol medication. She denies any issues with the medication. She has been doing well.  Chronic bilateral low back pain Patient has seen [place] [facility] orth[facility] for her chronic low back pain and is considering getting a second opinion or new referral for her low back pain and preferably somebody who is closer in [place] but she wants to wait until she is done with her shoulder surgery as she is currently seen in [place] for that.  Fibromyalgia and GERD Patient is coming in today for refills on both her fibromyalgia medications and reflux medications and they're both doing well for her. She denies any issues with them.  Nasal polyps Patient has been told previously that she has very large nasal polyps by an ENT and was prescribed surgery for this. She did not go ahead with it at that time and will like to look into this further at this point. She has chronic congestion from it.  Relevant past medical, surgical, family and social history reviewed and updated as indicated. Interim medical history since our last visit reviewed. Allergies and medications reviewed  and updated.  Review of Systems  Constitutional: Negative for fever and chills.  HENT: Positive for congestion. Negative for ear discharge and ear pain.   Eyes: Negative for redness and visual disturbance.  Respiratory: Negative for chest tightness and shortness of breath.   Cardiovascular: Negative for chest pain and leg swelling.  Genitourinary: Negative for dysuria and difficulty urinating.  Musculoskeletal: Positive for back pain. Negative for gait problem.  Skin: Negative for rash.  Neurological: Negative for light-headedness and headaches.  Psychiatric/Behavioral: Negative for behavioral problems and agitation.  All other systems reviewed and are negative.   Per HPI unless specifically indicated above     Medication List       This list is accurate as of: 05/23/15 12:30 PM.  Always use your most recent med list.               ALPRAZolam 0.5 MG tablet  Commonly known as:  XANAX  Take 1 tablet (0.5 mg total) by mouth daily as needed. For anxiety     BENEFIBER Powd  Take 2 each by mouth daily.     BETA CAROTENE PO  Take by mouth daily.     BIOTIN PO  Take by mouth daily.     cyclobenzaprine 10 MG tablet  Commonly known as:  FLEXERIL  Take 1 tablet (10 mg total) by mouth 3 (three) times daily as needed for muscle spasms.     fenofibrate 145 MG tablet  Commonly known as:  [employer]  Take 1 tablet (145 mg total) by mouth daily.     fluticasone 50 MCG/ACT nasal spray  Commonly known as:  FLONASE  Place 1 spray into both nostrils 2 (two) times daily as needed for allergies or rhinitis.     gabapentin 300 MG capsule  Commonly known as:  NEURONTIN  Take 1 capsule (300 mg total) by mouth at bedtime.     ibuprofen 800 MG tablet  Commonly known as:  ADVIL,MOTRIN  Take 1 tablet (800 mg total) by mouth every 6 (six) hours as needed.     omeprazole 20 MG capsule  Commonly known as:  PRILOSEC  Take 2 capsules (40 mg total) by mouth daily.     PROBIOTIC PEARLS PO    Take 1 capsule by mouth daily.     REXULTI PO  Take by mouth daily.     vitamin C 100 MG tablet  Take 100 mg by mouth daily. Reported on 05/21/2015     Vitamin D3 5000 units Tabs  Take by mouth. Two tablets per day     vitamin E 100 UNIT capsule  Take 100 Units by mouth daily.           Objective:    BP 132/87 mmHg  Pulse 104  Temp(Src) 98.2 F (36.8 C) (Oral)  Ht 5' 3"  (1.6 m)  Wt 148 lb (67.132 kg)  BMI 26.22 kg/m2  LMP 09/20/2011  Wt Readings from Last 3 Encounters:  05/23/15 148 lb (67.132 kg)  05/21/15 147 lb 8 oz (66.906 kg)  12/20/14 157 lb 3.2 oz (71.305 kg)    Physical Exam  Constitutional: She is oriented to person, place, and time. She appears well-developed and well-nourished. No distress.  HENT:  Right Ear: External ear normal.  Left Ear: External ear normal.  Nose: Nose normal.  Mouth/Throat: Oropharynx is clear and moist. No oropharyngeal exudate.  Enlarged turbinates and no visible polyps on exam externally  Eyes: Conjunctivae and EOM are normal. Pupils are equal, round, and reactive to light.  Neck: Neck supple. No thyromegaly present.  Cardiovascular: Normal rate, regular rhythm, normal heart sounds and intact distal pulses.   No murmur heard. Pulmonary/Chest: Effort normal and breath sounds normal. No respiratory distress. She has no wheezes.  Musculoskeletal: Normal range of motion. She exhibits tenderness (Bilateral paraspinal lumbar tenderness.). She exhibits no edema.  Lymphadenopathy:    She has no cervical adenopathy.  Neurological: She is alert and oriented to person, place, and time. Coordination normal.  Skin: Skin is warm and dry. No rash noted. She is not diaphoretic.  Psychiatric: She has a normal mood and affect. Her behavior is normal.  Nursing note and vitals reviewed.     Assessment & Plan:   Problem List Items Addressed This Visit      Musculoskeletal and Integument   Fibromyalgia   Relevant Medications   gabapentin  (NEURONTIN) 300 MG capsule     Other   Hyperlipidemia - Primary   Relevant Medications   fenofibrate (TRICOR) 145 MG tablet   Other Relevant Orders   EKG 12-Lead (Completed)   Lipid panel (Completed)    Other Visit Diagnoses    Preoperative general physical examination        Relevant Orders    CBC with Differential/Platelet (Completed)    CMP14+EGFR (Completed)    Lipid panel (Completed)    Gastroesophageal reflux disease, esophagitis presence not specified        Relevant Medications    omeprazole (PRILOSEC) 20  MG capsule    Chronic bilateral low back pain without sciatica        Would like referral to [facility] in the future for possible injections.    Nasal polyps        Relevant Orders    Ambulatory referral to ENT       Follow up plan: Return in about 6 months (around 11/22/2015), or if symptoms worsen or fail to improve, for Cholesterol and GERD recheck.  Counseling provided for all of the vaccine components Orders Placed This Encounter  Procedures  . CBC with Differential/Platelet  . CMP14+EGFR  . Lipid panel  . Ambulatory referral to ENT  . EKG 12-Lead    Caryl Pina, MD [facility] 05/23/2015, 12:30 PM

## 2015-05-24 LAB — CMP14+EGFR
A/G RATIO: 2.3 — AB (ref 1.2–2.2)
ALBUMIN: 4.8 g/dL (ref 3.5–5.5)
ALT: 22 IU/L (ref 0–32)
AST: 19 IU/L (ref 0–40)
Alkaline Phosphatase: 75 IU/L (ref 39–117)
BUN/Creatinine Ratio: 6 — ABNORMAL LOW (ref 9–23)
BUN: 4 mg/dL — ABNORMAL LOW (ref 6–24)
Bilirubin Total: <0.2 mg/dL (ref 0.0–1.2)
CALCIUM: 9.8 mg/dL (ref 8.7–10.2)
CO2: 25 mmol/L (ref 18–29)
Chloride: 97 mmol/L (ref 96–106)
Creatinine, Ser: 0.62 mg/dL (ref 0.57–1.00)
GFR, EST AFRICAN AMERICAN: 124 mL/min/{1.73_m2} (ref >59)
GFR, EST NON AFRICAN AMERICAN: 108 mL/min/{1.73_m2} (ref >59)
GLOBULIN, TOTAL: 2.1 g/dL (ref 1.5–4.5)
Glucose: 122 mg/dL — ABNORMAL HIGH (ref 65–99)
POTASSIUM: 4.2 mmol/L (ref 3.5–5.2)
SODIUM: 140 mmol/L (ref 134–144)
TOTAL PROTEIN: 6.9 g/dL (ref 6.0–8.5)

## 2015-05-24 LAB — CBC WITH DIFFERENTIAL/PLATELET
Basophils Absolute: 0 10*3/uL (ref 0.0–0.2)
Basos: 0 %
EOS (ABSOLUTE): 0.2 10*3/uL (ref 0.0–0.4)
Eos: 2 %
HEMATOCRIT: 43 % (ref 34.0–46.6)
Hemoglobin: 14.2 g/dL (ref 11.1–15.9)
IMMATURE GRANS (ABS): 0 10*3/uL (ref 0.0–0.1)
IMMATURE GRANULOCYTES: 0 %
LYMPHS ABS: 2.7 10*3/uL (ref 0.7–3.1)
Lymphs: 20 %
MCH: 30.5 pg (ref 26.6–33.0)
MCHC: 33 g/dL (ref 31.5–35.7)
MCV: 93 fL (ref 79–97)
MONOCYTES: 3 %
Monocytes Absolute: 0.5 10*3/uL (ref 0.1–0.9)
NEUTROS ABS: 10.3 10*3/uL — AB (ref 1.4–7.0)
Neutrophils: 75 %
Platelets: 396 10*3/uL — ABNORMAL HIGH (ref 150–379)
RBC: 4.65 x10E6/uL (ref 3.77–5.28)
RDW: 13.7 % (ref 12.3–15.4)
WBC: 13.7 10*3/uL — AB (ref 3.4–10.8)

## 2015-05-24 LAB — LIPID PANEL
CHOL/HDL RATIO: 7.1 ratio — AB (ref 0.0–4.4)
Cholesterol, Total: 249 mg/dL — ABNORMAL HIGH (ref 100–199)
HDL: 35 mg/dL — ABNORMAL LOW (ref >39)
LDL Calculated: 157 mg/dL — ABNORMAL HIGH (ref 0–99)
Triglycerides: 283 mg/dL — ABNORMAL HIGH (ref 0–149)
VLDL Cholesterol Cal: 57 mg/dL — ABNORMAL HIGH (ref 5–40)

## 2015-05-27 ENCOUNTER — Telehealth: Payer: Self-pay | Admitting: Family Medicine

## 2015-05-27 ENCOUNTER — Other Ambulatory Visit: Payer: Self-pay | Admitting: *Deleted

## 2015-05-27 MED ORDER — ATORVASTATIN CALCIUM 20 MG PO TABS: 20.0000 mg | ORAL_TABLET | Freq: Every day | ORAL | Status: DC

## 2015-05-27 NOTE — Telephone Encounter (Signed)
Verified with Dr.Dettinger that he did want pt to take both Tricor and Lipitor. Dr.Dettinger does want pt on both and advised pt she should be on both. Pt voiced understanding.

## 2015-06-10 ENCOUNTER — Other Ambulatory Visit: Payer: Self-pay | Admitting: *Deleted

## 2015-06-10 ENCOUNTER — Telehealth: Payer: Self-pay | Admitting: Family

## 2015-06-10 DIAGNOSIS — D72829 Elevated white blood cell count, unspecified: Secondary | ICD-10-CM

## 2015-06-27 ENCOUNTER — Telehealth: Payer: Self-pay | Admitting: Family Medicine

## 2015-06-27 NOTE — Telephone Encounter (Signed)
Patient aware.   Patient states that she does not understand why she can not get the paper work filled out.  Patient also sent message to psychiatrist.

## 2015-06-27 NOTE — Telephone Encounter (Signed)
i did see the paperwork, but unfortunately I could not sign for it unfortunately based on disability.

## 2015-07-02 ENCOUNTER — Encounter (HOSPITAL_COMMUNITY): Payer: Medicaid Other | Attending: Oncology | Admitting: Oncology

## 2015-07-02 ENCOUNTER — Encounter (HOSPITAL_COMMUNITY): Payer: Medicaid Other

## 2015-07-02 ENCOUNTER — Encounter (HOSPITAL_COMMUNITY): Payer: Self-pay | Admitting: Oncology

## 2015-07-02 ENCOUNTER — Ambulatory Visit (HOSPITAL_COMMUNITY): Payer: Medicaid Other | Admitting: Hematology & Oncology

## 2015-07-02 VITALS — BP 108/57 | HR 104 | Temp 97.8°F | Resp 16 | Ht 63.5 in | Wt 142.0 lb

## 2015-07-02 DIAGNOSIS — D72829 Elevated white blood cell count, unspecified: Secondary | ICD-10-CM | POA: Insufficient documentation

## 2015-07-02 DIAGNOSIS — D729 Disorder of white blood cells, unspecified: Secondary | ICD-10-CM

## 2015-07-02 DIAGNOSIS — Z720 Tobacco use: Secondary | ICD-10-CM

## 2015-07-02 DIAGNOSIS — D72828 Other elevated white blood cell count: Secondary | ICD-10-CM | POA: Diagnosis not present

## 2015-07-02 HISTORY — DX: Elevated white blood cell count, unspecified: D72.829

## 2015-07-02 LAB — CBC WITH DIFFERENTIAL/PLATELET
Basophils Absolute: 0 10*3/uL (ref 0.0–0.1)
Basophils Relative: 0 %
EOS ABS: 0.2 10*3/uL (ref 0.0–0.7)
Eosinophils Relative: 1 %
HCT: 43.6 % (ref 36.0–46.0)
HEMOGLOBIN: 14.6 g/dL (ref 12.0–15.0)
LYMPHS ABS: 3 10*3/uL (ref 0.7–4.0)
LYMPHS PCT: 22 %
MCH: 31.4 pg (ref 26.0–34.0)
MCHC: 33.5 g/dL (ref 30.0–36.0)
MCV: 93.8 fL (ref 78.0–100.0)
Monocytes Absolute: 0.4 10*3/uL (ref 0.1–1.0)
Monocytes Relative: 3 %
NEUTROS PCT: 74 %
Neutro Abs: 9.7 10*3/uL — ABNORMAL HIGH (ref 1.7–7.7)
Platelets: 401 10*3/uL — ABNORMAL HIGH (ref 150–400)
RBC: 4.65 MIL/uL (ref 3.87–5.11)
RDW: 13.2 % (ref 11.5–15.5)
WBC: 13.3 10*3/uL — AB (ref 4.0–10.5)

## 2015-07-02 LAB — SEDIMENTATION RATE: SED RATE: 14 mm/h (ref 0–22)

## 2015-07-02 NOTE — Assessment & Plan Note (Addendum)
Leukocytosis dates back to at least September 2013 with neutrophilia predominance (when a differential is available) with preservation of HGB/HCT/RBC and platelet count in the setting of GAD, fibromyalgia, and arthritis.  I reviewed the potential causes of leukocytosis (specifically mild neutrophilia) including but not limited to: ?Any active inflammatory condition or infection ?Cigarette smoking, which may be the most common cause of mild neutrophilia ?Previously diagnosed hematologic disease (such as acute and chronic leukemias, chronic myeloproliferative or myelodysplastic disease) ?The presence of, and treatment for, a chronic anxiety state, panic disorder, rage, or emotional stress (eg, posttraumatic stress disorder, depression) ?Presence of non-hematologic diseases known to increase neutrophil counts (eg, eclampsia, thyroid storm, hypercortisolism). ?Prior splenectomy or known asplenia ?Positive family history of neutrophilia ?Recent vaccination  ?Medications - Various medications may cause neutrophilia. However,  such cases are rare and appear in the literature as isolated case reports.  Plan today is to proceed with a CBC with peripheral smear review, evaluation for MPD, BCR-ABL to r/o CML, CRP and ESR to look for occult inflammatory disease.  Smoking cessation education is provided today.  We will see the patient back once lab results are reported in approximately two-four weeks. We will go over everything at that time.

## 2015-07-02 NOTE — Progress Notes (Signed)
[facility] Hematology/Oncology Consultation   Name: Penny Moreno      MRN: [id number]    Date: 07/02/2015 Time:6:10 PM   REFERRING PHYSICIAN:  Caryl Pina, MD (PCP at [facility])  REASON FOR CONSULT:  Elevated WBC   DIAGNOSIS:  Elevated WBC dating back to at least 2013 with neutrophilia.  HISTORY OF PRESENT ILLNESS:   Penny Moreno is a 50 y.o. female with a medical history significant for osteopenia, hypertriglyceridemia, hyperlipidemia, GAD, fibromyalgia, depression, and athritis who is referred to the [facility] for an elevated WBC dating back to at least 2013 with neutrophilia.  I personally reviewed and went over laboratory results with the patient.  The results are noted within this dictation.  Leukocytosis dates back to at least September 2013 with neutrophilia predominance (when a differential is available) with preservation of HGB/HCT/RBC and platelet count.  I personally reviewed and went over radiographic studies with the patient.  The results are noted within this dictation.  No significant imaging that is contributory to current referral.  Of note, her last mammogram was in 2014.  I discussed further annual mammogram testing and she refuses after a poor experience with her last mammogram.  She refuses any more moving forward.  Education regarding the role and importance in preventative health and screening discussed, particularly in light of her maternal grandmother having breast cancer at a very young age.  Review of Systems  Constitutional: Positive for weight loss (but stable over the past 2 years). Negative for fever and chills.       Night sweats, nightly x many years.  Not drenching in nature.  HENT: Negative.  Negative for sore throat.   Eyes: Negative.  Negative for double vision.  Respiratory: Negative.  Negative for cough and hemoptysis.   Cardiovascular: Negative.  Negative for chest pain.    Gastrointestinal: Positive for abdominal pain (LLQ abdominal pain). Negative for nausea, vomiting, diarrhea, constipation, blood in stool and melena.  Genitourinary: Positive for frequency (chronic, since childbirth). Negative for dysuria and urgency.  Musculoskeletal: Negative for myalgias and joint pain.  Skin: Negative.   Neurological: Negative.  Negative for headaches.  Endo/Heme/Allergies: Does not bruise/bleed easily.  Psychiatric/Behavioral: Negative.   14 point review of systems was performed and is negative except as detailed under history of present illness and above    PAST MEDICAL HISTORY:   Past Medical History  Diagnosis Date  . Anxiety   . Depression   . Arthritis   . OCD (obsessive compulsive disorder)   . Eating disorder   . Panic disorder   . Neuromuscular disorder (HCC)     Fibromyalgia  . Bipolar disorder ([facility])   . Vaginal Pap smear, abnormal   . GERD (gastroesophageal reflux disease)   . Frozen shoulder   . Back pain   . Leukocytosis 07/02/2015  . Vocal cord polyps     ALLERGIES: Allergies  Allergen Reactions  . Tetracyclines & Related Nausea And Vomiting  . Latex     Irritates skin   . Sulfa Antibiotics Nausea And Vomiting      MEDICATIONS: I have reviewed the patient's current medications.    Current Outpatient Prescriptions on File Prior to Visit  Medication Sig Dispense Refill  . ALPRAZolam (XANAX) 0.5 MG tablet Take 1 tablet (0.5 mg total) by mouth daily as needed. For anxiety (Patient taking differently: Take 0.5 mg by mouth 2 (two) times daily as needed.  For anxiety) 30 tablet 0  . Ascorbic Acid (VITAMIN C) 100 MG tablet Take 100 mg by mouth daily. Reported on 05/21/2015    . atorvastatin (LIPITOR) 20 MG tablet Take 1 tablet (20 mg total) by mouth daily. 90 tablet 1  . BETA CAROTENE PO Take by mouth daily.    Marland Kitchen BIOTIN PO Take by mouth daily.    . Brexpiprazole (REXULTI PO) Take 2 mg by mouth daily.     . Cholecalciferol (VITAMIN D3) 5000  UNITS TABS Take by mouth. Two tablets per day    . cyclobenzaprine (FLEXERIL) 10 MG tablet Take 1 tablet (10 mg total) by mouth 3 (three) times daily as needed for muscle spasms. 90 tablet 6  . fenofibrate (TRICOR) 145 MG tablet Take 1 tablet (145 mg total) by mouth daily. 90 tablet 1  . fluticasone (FLONASE) 50 MCG/ACT nasal spray Place 1 spray into both nostrils 2 (two) times daily as needed for allergies or rhinitis. 16 g 6  . gabapentin (NEURONTIN) 300 MG capsule Take 1 capsule (300 mg total) by mouth at bedtime. 90 capsule 3  . ibuprofen (ADVIL,MOTRIN) 800 MG tablet Take 1 tablet (800 mg total) by mouth every 6 (six) hours as needed. 30 tablet 3  . omeprazole (PRILOSEC) 20 MG capsule Take 2 capsules (40 mg total) by mouth daily. 180 capsule 3  . Probiotic Product (PROBIOTIC PEARLS PO) Take 1 capsule by mouth daily.    . vitamin E 100 UNIT capsule Take 100 Units by mouth daily.    . Wheat Dextrin (BENEFIBER) POWD Take 2 each by mouth daily. 350 g 11   No current facility-administered medications on file prior to visit.     PAST SURGICAL HISTORY Past Surgical History  Procedure Laterality Date  . Diagnostic laparoscopy      [place]  . Tonsillectomy    . Laparoscopic tubal ligation  10/30/2011    Procedure: LAPAROSCOPIC TUBAL LIGATION;  Surgeon: Jonnie Kind, MD;  Location: [facility];  Service: Gynecology;  Laterality: Bilateral;  Laparoscopic Bilateral tubal ligation with falope rings  . Tubal ligation  10/04/2011    FAMILY HISTORY: Family History  Problem Relation Age of Onset  . Hyperlipidemia Mother   . Hypertension Mother   . Hyperlipidemia Father   . Bipolar disorder Sister   . Cancer Maternal Grandmother     breast cancer  . Heart disease Maternal Grandfather 52   Her mother is 43 years old and healthy, other than HTN. Father is 77 years old.  He is an alcoholic. 2 daughters, 97 and 34 years old.  Both healthy. She has a family history of breast cancer in maternal  grandmother having passed away at the age of 33 from MI.  SOCIAL HISTORY: She has a 30 pack year smoking history.  She rarely has an alcoholic beverage.  She denies any illicit drug abuse.  She is currently separated from her 2nd husband.  Divorced from her first.  She is not religious.  She is currently not working.   Social History   Social History  . Marital Status: Legally Separated    Spouse Name: N/A  . Number of Children: N/A  . Years of Education: N/A   Social History Main Topics  . Smoking status: Current Every Day Smoker -- 0.50 packs/day for 30 years    Types: Cigarettes  . Smokeless tobacco: Never Used  . Alcohol Use: No  . Drug Use: No  . Sexual Activity: Yes  Birth Control/ Protection: None, Post-menopausal   Other Topics Concern  . None   Social History Narrative    PERFORMANCE STATUS: The patient's performance status is 0 - Asymptomatic  PHYSICAL EXAM: Most Recent Vital Signs: Blood pressure 108/57, pulse 104, temperature 97.8 F (36.6 C), resp. rate 16, height 5' 3.5" (1.613 m), weight 142 lb (64.411 kg), last menstrual period 09/20/2011, SpO2 99 %. General appearance: alert, cooperative, appears older than stated age, no distress, mildly obese and deep voice, accompanied by her 66 year old daughter. Head: Normocephalic, without obvious abnormality, atraumatic Eyes: negative findings: lids and lashes normal, conjunctivae and sclerae normal and corneas clear Throat: lips, mucosa, and tongue normal; teeth and gums normal Neck: no adenopathy, supple, symmetrical, trachea midline and thyroid not enlarged, symmetric, no tenderness/mass/nodules Back: symmetric, no curvature. ROM normal. No CVA tenderness. Lungs: clear to auscultation bilaterally and normal percussion bilaterally Heart: regular rate and rhythm, S1, S2 normal, no murmur, click, rub or gallop Abdomen: soft, non-tender; bowel sounds normal; no masses,  no organomegaly Extremities: extremities  normal, atraumatic, no cyanosis or edema Skin: Skin color, texture, turgor normal. No rashes or lesions Lymph nodes: Cervical, supraclavicular, and axillary nodes normal. Neurologic: Grossly normal  LABORATORY DATA:  CBC    Component Value Date/Time   WBC 13.3* 07/02/2015 1224   WBC 13.7* 05/23/2015 1237   WBC 9.9 07/24/2013 1322   RBC 4.65 07/02/2015 1224   RBC 4.65 05/23/2015 1237   RBC 4.5 07/24/2013 1322   HGB 14.6 07/02/2015 1224   HGB 14.1 07/24/2013 1322   HCT 43.6 07/02/2015 1224   HCT 43.0 05/23/2015 1237   HCT 43.8 07/24/2013 1322   PLT 401* 07/02/2015 1224   PLT 396* 05/23/2015 1237   MCV 93.8 07/02/2015 1224   MCV 93 05/23/2015 1237   MCV 97.2* 07/24/2013 1322   MCH 31.4 07/02/2015 1224   MCH 30.5 05/23/2015 1237   MCH 31.3* 07/24/2013 1322   MCHC 33.5 07/02/2015 1224   MCHC 33.0 05/23/2015 1237   MCHC 32.2 07/24/2013 1322   RDW 13.2 07/02/2015 1224   RDW 13.7 05/23/2015 1237   LYMPHSABS 3.0 07/02/2015 1224   LYMPHSABS 2.7 05/23/2015 1237   MONOABS 0.4 07/02/2015 1224   EOSABS 0.2 07/02/2015 1224   EOSABS 0.2 05/23/2015 1237   BASOSABS 0.0 07/02/2015 1224   BASOSABS 0.0 05/23/2015 1237      Chemistry      Component Value Date/Time   NA 140 05/23/2015 1237   NA 139 08/23/2012 1227   K 4.2 05/23/2015 1237   CL 97 05/23/2015 1237   CO2 25 05/23/2015 1237   BUN 4* 05/23/2015 1237   BUN 8 08/23/2012 1227   CREATININE 0.62 05/23/2015 1237   CREATININE 0.65 08/23/2012 1227      Component Value Date/Time   CALCIUM 9.8 05/23/2015 1237   ALKPHOS 75 05/23/2015 1237   AST 19 05/23/2015 1237   ALT 22 05/23/2015 1237   BILITOT <0.2 05/23/2015 1237   BILITOT <0.2 07/24/2013 1231     Results for LADELLE, TEODORO (MRN [id number])   Ref. Range 10/21/2011 14:50 08/23/2012 12:48 07/24/2013 13:22 10/02/2014 15:10 05/23/2015 12:37 07/02/2015 12:24  WBC Latest Ref Range: 4.0-10.5 K/uL 10.8 (H) 10.4 (A) 9.9 14.3 (H) 13.7 (H) 13.3 (H)    RADIOGRAPHY: No results  found.      ASSESSMENT/PLAN:  Leukocytosis Neutrophilia Tobacco Use  Leukocytosis dates back to at least September 2013 with neutrophilia predominance (when a differential is available) with preservation  of HGB/HCT/RBC and platelet count in the setting of GAD, fibromyalgia, and arthritis.  I reviewed the potential causes of leukocytosis (specifically mild neutrophilia) including but not limited to: ?Any active inflammatory condition or infection ?Cigarette smoking, which may be the most common cause of mild neutrophilia ?Previously diagnosed hematologic disease (such as acute and chronic leukemias, chronic myeloproliferative or myelodysplastic disease) ?The presence of, and treatment for, a chronic anxiety state, panic disorder, rage, or emotional stress (eg, posttraumatic stress disorder, depression) ?Presence of non-hematologic diseases known to increase neutrophil counts (eg, eclampsia, thyroid storm, hypercortisolism). ?Prior splenectomy or known asplenia ?Positive family history of neutrophilia ?Recent vaccination  ?Medications - Various medications may cause neutrophilia. However,  such cases are rare and appear in the literature as isolated case reports.  Plan today is to proceed with a CBC with peripheral smear review, evaluation for MPD, BCR-ABL to r/o CML, CRP and ESR to look for occult inflammatory disease  We will see the patient back once lab results are reported in approximately two-four weeks. We will go over everything at that time.  I addressed the importance of smoking cessation with the patient in detail.  We discussed the health benefits of cessation.  We discussed the health detriments of ongoing tobacco use including but not limited to COPD, heart disease and malignancy. We reviewed the multiple options for cessation and I offered to refer her to smoking cessation classes. We discussed other alternatives to quit such as chantix, wellbutrin. We will continue to address  this moving forward.  ORDERS PLACED FOR THIS ENCOUNTER: Orders Placed This Encounter  Procedures  . CBC with Differential  . Sedimentation rate  . Pathologist smear review  . C-reactive protein  . JAK2 V617F, Rfx CALR/E12/MPL  . BCR-ABL1, CML/ALL, PCR, QUANT  . Miscellaneous test - Anatomic Pathology   All questions were answered. The patient knows to call the clinic with any problems, questions or concerns. We can certainly see the patient much sooner if necessary.  This note is electronically signed by Molli Hazard, MD  :07/02/2015 6:10 PM

## 2015-07-02 NOTE — Patient Instructions (Signed)
[place] at [facility] Discharge Instructions  RECOMMENDATIONS MADE BY THE CONSULTANT AND ANY TEST RESULTS WILL BE SENT TO YOUR REFERRING PHYSICIAN.  Labs today  Return in 3 weeks to see Dr. Whitney Muse  Thank you for choosing [place] at [facility] to provide your oncology and hematology care.  To afford each patient quality time with our provider, please arrive at least 15 minutes before your scheduled appointment time.   Beginning January 23rd 2017 lab work for the [facility] will be done in the  Main lab at [facility] on 1st floor. If you have a lab appointment with the [facility] please come in thru the  Main Entrance and check in at the main information desk  You need to re-schedule your appointment should you arrive 10 or more minutes late.  We strive to give you quality time with our providers, and arriving late affects you and other patients whose appointments are after yours.  Also, if you no show three or more times for appointments you may be dismissed from the clinic at the providers discretion.     Again, thank you for choosing [facility].  Our hope is that these requests will decrease the amount of time that you wait before being seen by our physicians.       _____________________________________________________________  Should you have questions after your visit to [facility], please contact our office at [phone] between the hours of 8:30 a.m. and 4:30 p.m.  Voicemails left after 4:30 p.m. will not be returned until the following business day.  For prescription refill requests, have your pharmacy contact our office.         Resources For Cancer Patients and their Caregivers ? American Cancer Society: Can assist with transportation, wigs, general needs, runs Look Good Feel Better.        681 623 0070 ? Cancer Care: Provides financial assistance, online support groups,  medication/co-pay assistance.  1-800-813-HOPE 8473814925) ? [place] Assists [place] cancer patients and their families through emotional , educational and financial support.  (419) 492-0019 ? [place] DSS Where to apply for food stamps, Medicaid and utility assistance. 228-427-3947 ? [facility]: Transportation to medical appointments. 432-387-3190 ? Social Security Administration: May apply for disability if have a Stage IV cancer. 873-521-7044 249-577-1938 ? [employer], Disability and Transit Services: Assists with nutrition, care and transit needs. [place] Support Programs: @10RELATIVEDAYS @ > Cancer Support Group  2nd Tuesday of the month 1pm-2pm, Journey Room  > Creative Journey  3rd Tuesday of the month 1130am-1pm, Journey Room  > Look Good Feel Better  1st Wednesday of the month 10am-12 noon, Journey Room (Call [place] to register [phone])

## 2015-07-03 LAB — C-REACTIVE PROTEIN: CRP: <0.5 mg/dL (ref <1.0)

## 2015-07-04 LAB — PATHOLOGIST SMEAR REVIEW

## 2015-07-08 LAB — BCR-ABL1, CML/ALL, PCR, QUANT

## 2015-07-11 ENCOUNTER — Ambulatory Visit (INDEPENDENT_AMBULATORY_CARE_PROVIDER_SITE_OTHER): Payer: Medicaid Other | Admitting: Otolaryngology

## 2015-07-11 LAB — CALR + JAK2 E12-15 + MPL (REFLEXED)

## 2015-07-11 LAB — JAK2 V617F, W REFLEX TO CALR/E12/MPL

## 2015-07-18 ENCOUNTER — Ambulatory Visit: Payer: Medicaid Other | Attending: Orthopedic Surgery | Admitting: Physical Therapy

## 2015-07-18 DIAGNOSIS — M25512 Pain in left shoulder: Secondary | ICD-10-CM | POA: Insufficient documentation

## 2015-07-18 DIAGNOSIS — M25612 Stiffness of left shoulder, not elsewhere classified: Secondary | ICD-10-CM | POA: Diagnosis present

## 2015-07-18 NOTE — Therapy (Signed)
[facility] [place], [place], [address] Phone: [phone]   Fax:  [phone]  Physical Therapy Evaluation  Patient Details  Name: Penny Moreno MRN: [id number] Date of Birth: Sep 08, 1965 Referring Provider: Esmond Plants MD  Encounter Date: 07/18/2015      PT End of Session - 07/18/15 1426    Visit Number 1   Number of Visits 16   Date for PT Re-Evaluation 09/12/15   PT Start Time 0106   PT Stop Time 0150   PT Time Calculation (min) 44 min   Activity Tolerance Patient tolerated treatment well   Behavior During Therapy [facility] for tasks assessed/performed      Past Medical History  Diagnosis Date  . Anxiety   . Depression   . Arthritis   . OCD (obsessive compulsive disorder)   . Eating disorder   . Panic disorder   . Neuromuscular disorder (HCC)     Fibromyalgia  . Bipolar disorder ([place])   . Vaginal Pap smear, abnormal   . GERD (gastroesophageal reflux disease)   . Frozen shoulder   . Back pain   . Leukocytosis 07/02/2015  . Vocal cord polyps     Past Surgical History  Procedure Laterality Date  . Diagnostic laparoscopy      [place]  . Tonsillectomy    . Laparoscopic tubal ligation  10/30/2011    Procedure: LAPAROSCOPIC TUBAL LIGATION;  Surgeon: Jonnie Kind, MD;  Location: [facility];  Service: Gynecology;  Laterality: Bilateral;  Laparoscopic Bilateral tubal ligation with falope rings  . Tubal ligation  10/04/2011    There were no vitals filed for this visit.       Subjective Assessment - 07/18/15 1421    Subjective The patient states she has been doing the pendulum at home and using her right hand to lift her left arm up.   Limitations House hold activities   Patient Stated Goals Want to regain use of my left UE.   Currently in Pain? Yes   Pain Score 6    Pain Location Shoulder   Pain Orientation Left   Pain Descriptors / Indicators Aching   Pain Type Surgical pain   Pain Onset In the past  7 days   Pain Frequency Constant   Aggravating Factors  Movement of left UE.   Pain Relieving Factors Rest and pain medications.            [facility] PT Assessment - 07/18/15 0001    Assessment   Medical Diagnosis Rotator cuff surgery with capsular release.   Referring Provider Esmond Plants MD   Onset Date/Surgical Date --  07/17/15(surgery date).   Precautions   Precaution Comments Begin with gentle left shoulder ROM.   Required Braces or Orthoses --  Left shoulder sling.   Restrictions   Weight Bearing Restrictions No   Balance Screen   Has the patient fallen in the past 6 months No   Has the patient had a decrease in activity level because of a fear of falling?  No   Is the patient reluctant to leave their home because of a fear of falling?  No   Home Ecologist residence   Prior Function   Level of Independence Independent   Cognition   Overall Cognitive Status Within Functional Limits for tasks assessed   Observation/Other Assessments   Observations Left shoulder post-surgical dressing intact.   Posture/Postural Control   Posture Comments Guarded left shoulder posture.  ROM / Strength   AROM / PROM / Strength PROM;Strength   PROM   Overall PROM Comments In supine PAROM into left shoulder flexion= 105 degrees; ER= 20 degrees and 65 degrees of IR.   Strength   Overall Strength Comments --  Not tested.   Palpation   Palpation comment Patient c/o diffus left shoulder pain currently.   Ambulation/Gait   Gait Comments WNL.                   [place] Adult PT Treatment/Exercise - 07/18/15 0001    Manual Therapy   Manual Therapy Passive ROM   Manual therapy comments In supine performed left shoulder PAROM into flexion and ER x 12 minutes.                PT Education - 07/18/15 1425    Education provided Yes   Person(s) Educated Patient   Methods Explanation;Demonstration;Tactile cues;Verbal cues   Comprehension  Verbalized understanding             PT Long Term Goals - 07/18/15 1451    PT LONG TERM GOAL #1   Title Independent with a HEP.   Baseline No knowledge of appropriate ther ex progression.   Time 8   Period Weeks   Status New   PT LONG TERM GOAL #2   Title Active left shoulder flexion to 150 degrees so the patient can easily reach overhead   Baseline 105 degrees of flexion.   Time 8   Period Weeks   Status New   PT LONG TERM GOAL #3   Title Active ER to 70 degrees+ to allow for easily donning/doffing of apparel   Baseline 20 degrees of ER.   Time 8   Period Weeks   Status New   PT LONG TERM GOAL #4   Title Active behind the back to L2   Baseline 65 degrees of IR.   Time 8   Period Weeks   Status New   PT LONG TERM GOAL #5   Title Patient perform ADL's with pain not > 3/10.   Baseline Cannot use left UE for performance of ADL's.   Time 8   Period Weeks   Status New               Plan - 07/18/15 1427    Clinical Impression Statement The patient presents to outpatient physical therpy with a two year h/o left shoulder pain.  Her pain and loss of motion has gotten so bad that she underwent surgery.  Surgery for a rotator cuff repair and capsular release was performed on 07/17/15.  She has been very motivated and has been compliant to her HEP which involves pendulum exercises and active-assistive left shoulder flexion.  Her pain-level today is a 6/10.  She still has her post-surgical dressing intact.  She exhibits loss of left shoulder range of motion and will benefit from skilled therapeutic intervention.  She is "staying ahead" of her pain with medication and did very well today.     Rehab Potential Good   Clinical Impairments Affecting Rehab Potential Tight shoulder prior to surgery.   PT Frequency 2x / week   PT Duration 8 weeks   PT Treatment/Interventions ADLs/Self Care Home Management;Cryotherapy;Electrical Stimulation;Therapeutic exercise;Therapeutic  activities;Ultrasound;Patient/family education;Manual techniques;Passive range of motion   PT Next Visit Plan Perform PAROM; AAROM to patient's left shoulder.  Modalities as needed.   Consulted and Agree with Plan of Care Patient  Patient will benefit from skilled therapeutic intervention in order to improve the following deficits and impairments:  Pain, Decreased activity tolerance, Decreased range of motion  Visit Diagnosis: Pain in left shoulder - Plan: PT plan of care cert/re-cert  Stiffness of left shoulder, not elsewhere classified - Plan: PT plan of care cert/re-cert     Problem List Patient Active Problem List   Diagnosis Date Noted  . Leukocytosis 07/02/2015  . CIN I (cervical intraepithelial neoplasia I) 11/10/2014  . Hypertriglyceridemia 10/03/2014  . Hyperlipidemia 10/02/2014  . GAD (generalized anxiety disorder) 10/02/2014  . Arthritis 10/02/2014  . Fibromyalgia 10/02/2014  . Pap smear abnormality of cervix with LGSIL 09/26/2014  . Osteopenia 09/06/2013  . Depression 07/27/2012  . Sterilization Fallope rings 10/30/2011    Ziah Turvey, [place] MPT 07/18/2015, 3:21 PM  [facility] [address] [place], [place], [address] Phone: [phone]   Fax:  [phone]  Name: Penny Moreno MRN: [id number] Date of Birth: [birth year]

## 2015-07-18 NOTE — Patient Instructions (Signed)
Reviewed pendulum exercise and passive-assistive cane exercise to increase left shoulder ER in supine.

## 2015-07-22 ENCOUNTER — Ambulatory Visit: Payer: Medicaid Other | Admitting: Physical Therapy

## 2015-07-22 ENCOUNTER — Encounter: Payer: Self-pay | Admitting: Physical Therapy

## 2015-07-22 DIAGNOSIS — M25512 Pain in left shoulder: Secondary | ICD-10-CM | POA: Diagnosis not present

## 2015-07-22 DIAGNOSIS — M25612 Stiffness of left shoulder, not elsewhere classified: Secondary | ICD-10-CM

## 2015-07-22 NOTE — Therapy (Signed)
[facility] [place], [place], [address] Phone: [phone]   Fax:  [phone]  Physical Therapy Treatment  Patient Details  Name: Penny Moreno MRN: [id number] Date of Birth: 05-02-1965 Referring Provider: Esmond Plants MD  Encounter Date: 07/22/2015      PT End of Session - 07/22/15 1240    Visit Number 2   Number of Visits 16   Date for PT Re-Evaluation 09/12/15   PT Start Time 1230   PT Stop Time 1258   PT Time Calculation (min) 28 min   Activity Tolerance Patient tolerated treatment well   Behavior During Therapy [facility] for tasks assessed/performed      Past Medical History  Diagnosis Date  . Anxiety   . Depression   . Arthritis   . OCD (obsessive compulsive disorder)   . Eating disorder   . Panic disorder   . Neuromuscular disorder (HCC)     Fibromyalgia  . Bipolar disorder ([place])   . Vaginal Pap smear, abnormal   . GERD (gastroesophageal reflux disease)   . Frozen shoulder   . Back pain   . Leukocytosis 07/02/2015  . Vocal cord polyps     Past Surgical History  Procedure Laterality Date  . Diagnostic laparoscopy      [place]  . Tonsillectomy    . Laparoscopic tubal ligation  10/30/2011    Procedure: LAPAROSCOPIC TUBAL LIGATION;  Surgeon: Jonnie Kind, MD;  Location: [facility];  Service: Gynecology;  Laterality: Bilateral;  Laparoscopic Bilateral tubal ligation with falope rings  . Tubal ligation  10/04/2011    There were no vitals filed for this visit.      Subjective Assessment - 07/22/15 1233    Subjective The patient states she has been doing the pendulum at home and using her right hand to lift her left arm up.   Limitations House hold activities   Patient Stated Goals Want to regain use of my left UE.   Currently in Pain? Yes   Pain Score 6    Pain Location Shoulder   Pain Orientation Left   Pain Descriptors / Indicators Aching   Pain Type Surgical pain   Pain Onset In the past 7  days   Pain Frequency Constant   Aggravating Factors  any movement   Pain Relieving Factors rest and meds                         [facility] Adult PT Treatment/Exercise - 07/22/15 0001    Manual Therapy   Manual Therapy Passive ROM   Manual therapy comments In supine performed left shoulder P/AAROM into flexion and ER                      PT Long Term Goals - 07/18/15 1451    PT LONG TERM GOAL #1   Title Independent with a HEP.   Baseline No knowledge of appropriate ther ex progression.   Time 8   Period Weeks   Status New   PT LONG TERM GOAL #2   Title Active left shoulder flexion to 150 degrees so the patient can easily reach overhead   Baseline 105 degrees of flexion.   Time 8   Period Weeks   Status New   PT LONG TERM GOAL #3   Title Active ER to 70 degrees+ to allow for easily donning/doffing of apparel   Baseline 20 degrees of ER.  Time 8   Period Weeks   Status New   PT LONG TERM GOAL #4   Title Active behind the back to L2   Baseline 65 degrees of IR.   Time 8   Period Weeks   Status New   PT LONG TERM GOAL #5   Title Patient perform ADL's with pain not > 3/10.   Baseline Cannot use left UE for performance of ADL's.   Time 8   Period Weeks   Status New               Plan - 07/22/15 1259    Clinical Impression Statement Patient progressing very well with P/AAROM and has reported doing her exercises per MD daily. Patient has bandages on incision today and unable to use electrical stim for pain yet she puts ice on shoulder at home. Patient has no increased pain during treatment today. Patient going to MD this week. Patient goals ongoing due to protocol and healing  limitations.   Rehab Potential Good   Clinical Impairments Affecting Rehab Potential Tight shoulder prior to surgery.   PT Frequency 2x / week   PT Duration 8 weeks   PT Treatment/Interventions ADLs/Self Care Home Management;Cryotherapy;Electrical  Stimulation;Therapeutic exercise;Therapeutic activities;Ultrasound;Patient/family education;Manual techniques;Passive range of motion   PT Next Visit Plan cont with POC for PAROM; AAROM to patient's left shoulder/Modalities when able and as needed   Consulted and Agree with Plan of Care Patient      Patient will benefit from skilled therapeutic intervention in order to improve the following deficits and impairments:  Pain, Decreased activity tolerance, Decreased range of motion  Visit Diagnosis: Pain in left shoulder  Stiffness of left shoulder, not elsewhere classified     Problem List Patient Active Problem List   Diagnosis Date Noted  . Leukocytosis 07/02/2015  . CIN I (cervical intraepithelial neoplasia I) 11/10/2014  . Hypertriglyceridemia 10/03/2014  . Hyperlipidemia 10/02/2014  . GAD (generalized anxiety disorder) 10/02/2014  . Arthritis 10/02/2014  . Fibromyalgia 10/02/2014  . Pap smear abnormality of cervix with LGSIL 09/26/2014  . Osteopenia 09/06/2013  . Depression 07/27/2012  . Sterilization Fallope rings 10/30/2011    Baxter Gonzalez P, PTA 07/22/2015, 1:09 PM  [facility] [place], [place], [address] Phone: [phone]   Fax:  [phone]  Name: DOREATHEA MAKLEY MRN: [id number] Date of Birth: 1965/09/09

## 2015-07-24 ENCOUNTER — Encounter: Payer: Medicaid Other | Admitting: Physical Therapy

## 2015-07-24 ENCOUNTER — Encounter (HOSPITAL_COMMUNITY): Payer: Self-pay | Admitting: Oncology

## 2015-07-24 ENCOUNTER — Encounter (HOSPITAL_COMMUNITY): Payer: Medicaid Other | Attending: Oncology | Admitting: Oncology

## 2015-07-24 DIAGNOSIS — D72829 Elevated white blood cell count, unspecified: Secondary | ICD-10-CM | POA: Diagnosis not present

## 2015-07-24 DIAGNOSIS — Z720 Tobacco use: Secondary | ICD-10-CM | POA: Diagnosis not present

## 2015-07-24 NOTE — Progress Notes (Signed)
Penny Dun, FNP [place] [id number]  Leukocytosis - Plan: CBC with Differential  CURRENT THERAPY: Peripheral work-up complete.  INTERVAL HISTORY: Penny Moreno 50 y.o. female returns for followup of leukocytosis with neutrophilia dating back to at least 2013 with negative peripheral work-up including: normal ESR/CRP, negative JAK2 V617F mutation, JAK2 exon 12 mutation, CALR mutation, MPL mutation, and no monoclonal B cell population or abnormal T cell phenotype on peripheral FLOW cytometry.  She recently underwent right shoulder surgery by Dr. Veverly Fells, [facility].  She is currently undergoing physical therapy.  She denies any B symptoms.  She denies any new pains or discomforts (excluding her left shoulder of course).  I reviewed all of her lab results and FLOW cytometry with her.  Review of Systems  Constitutional: Negative for fever, chills and weight loss.  HENT: Negative.   Eyes: Negative.   Respiratory: Negative.   Cardiovascular: Negative.   Gastrointestinal: Negative for abdominal pain.  Genitourinary: Negative.   Musculoskeletal: Positive for joint pain (Left shoulder pain, S/P surgery.).  Skin: Negative.   Neurological: Negative.   Endo/Heme/Allergies: Negative.   Psychiatric/Behavioral: Negative.     Past Medical History  Diagnosis Date  . Anxiety   . Depression   . Arthritis   . OCD (obsessive compulsive disorder)   . Eating disorder   . Panic disorder   . Neuromuscular disorder (HCC)     Fibromyalgia  . Bipolar disorder ([place])   . Vaginal Pap smear, abnormal   . GERD (gastroesophageal reflux disease)   . Frozen shoulder   . Back pain   . Leukocytosis 07/02/2015  . Vocal cord polyps     Past Surgical History  Procedure Laterality Date  . Diagnostic laparoscopy      [place]  . Tonsillectomy    . Laparoscopic tubal ligation  10/30/2011    Procedure: LAPAROSCOPIC TUBAL LIGATION;  Surgeon: Jonnie Kind,  MD;  Location: [facility];  Service: Gynecology;  Laterality: Bilateral;  Laparoscopic Bilateral tubal ligation with falope rings  . Tubal ligation  10/04/2011    Family History  Problem Relation Age of Onset  . Hyperlipidemia Mother   . Hypertension Mother   . Hyperlipidemia Father   . Bipolar disorder Sister   . Cancer Maternal Grandmother     breast cancer  . Heart disease Maternal Grandfather 89    Social History   Social History  . Marital Status: Legally Separated    Spouse Name: N/A  . Number of Children: N/A  . Years of Education: N/A   Social History Main Topics  . Smoking status: Current Every Day Smoker -- 0.50 packs/day for 30 years    Types: Cigarettes  . Smokeless tobacco: Never Used  . Alcohol Use: No  . Drug Use: No  . Sexual Activity: Yes    Birth Control/ Protection: None, Post-menopausal   Other Topics Concern  . None   Social History Narrative     PHYSICAL EXAMINATION  ECOG PERFORMANCE STATUS: 0 - Asymptomatic  There were no vitals filed for this visit.  BP 126/81 P 93 R 16 T 87F O2 Sat 99% RA  GENERAL:alert, no distress, well nourished, well developed, anxious, comfortable, cooperative, smiling and chronically ill appearing and appearing older than stated age. SKIN: skin color, texture, turgor are normal, no rashes or significant lesions HEAD: Normocephalic, No masses, lesions, tenderness or abnormalities EYES: normal, EOMI, Conjunctiva are pink and non-injected EARS: External ears normal  OROPHARYNX:lips, buccal mucosa, and tongue normal and mucous membranes are moist  NECK: supple, no adenopathy, thyroid normal size, non-tender, without nodularity, trachea midline LYMPH:  not examined BREAST:not examined LUNGS: clear to auscultation  HEART: regular rate & rhythm ABDOMEN:abdomen soft, non-tender and normal bowel sounds BACK: Back symmetric, no curvature. EXTREMITIES:less then 2 second capillary refill, no joint deformities, effusion, or  inflammation, no skin discoloration, no cyanosis  NEURO: alert & oriented x 3 with fluent speech, no focal motor/sensory deficits, gait normal   LABORATORY DATA: CBC    Component Value Date/Time   WBC 13.3* 07/02/2015 1224   WBC 13.7* 05/23/2015 1237   WBC 9.9 07/24/2013 1322   RBC 4.65 07/02/2015 1224   RBC 4.65 05/23/2015 1237   RBC 4.5 07/24/2013 1322   HGB 14.6 07/02/2015 1224   HGB 14.1 07/24/2013 1322   HCT 43.6 07/02/2015 1224   HCT 43.0 05/23/2015 1237   HCT 43.8 07/24/2013 1322   PLT 401* 07/02/2015 1224   PLT 396* 05/23/2015 1237   MCV 93.8 07/02/2015 1224   MCV 93 05/23/2015 1237   MCV 97.2* 07/24/2013 1322   MCH 31.4 07/02/2015 1224   MCH 30.5 05/23/2015 1237   MCH 31.3* 07/24/2013 1322   MCHC 33.5 07/02/2015 1224   MCHC 33.0 05/23/2015 1237   MCHC 32.2 07/24/2013 1322   RDW 13.2 07/02/2015 1224   RDW 13.7 05/23/2015 1237   LYMPHSABS 3.0 07/02/2015 1224   LYMPHSABS 2.7 05/23/2015 1237   MONOABS 0.4 07/02/2015 1224   EOSABS 0.2 07/02/2015 1224   EOSABS 0.2 05/23/2015 1237   BASOSABS 0.0 07/02/2015 1224   BASOSABS 0.0 05/23/2015 1237      Chemistry      Component Value Date/Time   NA 140 05/23/2015 1237   NA 139 08/23/2012 1227   K 4.2 05/23/2015 1237   CL 97 05/23/2015 1237   CO2 25 05/23/2015 1237   BUN 4* 05/23/2015 1237   BUN 8 08/23/2012 1227   CREATININE 0.62 05/23/2015 1237   CREATININE 0.65 08/23/2012 1227      Component Value Date/Time   CALCIUM 9.8 05/23/2015 1237   ALKPHOS 75 05/23/2015 1237   AST 19 05/23/2015 1237   ALT 22 05/23/2015 1237   BILITOT <0.2 05/23/2015 1237   BILITOT <0.2 07/24/2013 1231        PENDING LABS:   RADIOGRAPHIC STUDIES:  No results found.   PATHOLOGY:  Interpretation Peripheral Blood Flow Cytometry - NO MONOCLONAL B CELL POPULATION OR ABNORMAL T CELL PHENOTYPE IDENTIFIED. 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  I reviewed the potential causes of leukocytosis (specifically mild neutrophilia) including but not limited to: ?Any active inflammatory condition or infection ?Cigarette smoking, which may be the most common cause of mild neutrophilia ?Previously diagnosed hematologic disease (such as acute and chronic leukemias, chronic myeloproliferative or myelodysplastic disease) ?The presence of, and treatment for, a chronic anxiety state, panic disorder, rage, or emotional stress (eg, posttraumatic stress disorder, depression) ?Presence of non-hematologic diseases known to increase neutrophil counts (eg, eclampsia, thyroid storm, hypercortisolism). ?Prior splenectomy or known asplenia ?Positive family history of neutrophilia ?Recent vaccination  ?Medications - Various medications may cause neutrophilia. However, such  cases are rare and appear in the literature as isolated case reports.   Peripheral work-up has been negative.  She is smoking, but has cut back to 1/2 ppd.  I have provided her smoking cessation education.  She is given information regarding smoking cessation class.  Indications for bone marrow aspiration and biopsy:  A. Unexplained anemia  B. Macrocytic anemia(to distinguish megaloblastic from normoblastic maturation)  C. Unexplained leukopenia  D. Unexplained thrombocytopenia  E. Pancytopenia  F. Presence of blasts on peripheral smear (investigation for possible leukemia)  G. Presence of teardrop red cells on peripheral smear (possible myelofibrosis)  H. Presence of hairy cells on peripheral smear (possible hairy cell leukemia)  I. Suspected Multiple Myeloma  J. Staging for  non-Hodgkin's Lymphoma  K. Unexplained splenomegaly (possible splenomegaly)  L. Suspected storage disease (eg: Gaucher disease, Niemann-Pick)  M. Fever of unknown origin  N. Suspected chromosomal disorders in neonates (requiring rapid confirmation).  O. Confirmation of normal marrow in potential allogenic donor  P. Work-up for amyloidosis (to detect clonal plasma cell disorder)   At this time, I do not feel a need for a bone marrow aspiration and biopsy.  We will continue to monitor her counts.  She denies any B symptoms.  Labs in 3 months: CBC diff  Return in 3 months for follow-up.    ORDERS PLACED FOR THIS ENCOUNTER: Orders Placed This Encounter  Procedures  . CBC with Differential    MEDICATIONS PRESCRIBED THIS ENCOUNTER: No orders of the defined types were placed in this encounter.    THERAPY PLAN:  Will continue to follow counts and if needed, will pursue a bone marrow aspiration and biopsy.  All questions were answered. The patient knows to call the clinic with any problems, questions or concerns. We can certainly see the patient much sooner if necessary.  Patient and plan discussed with Dr. Ancil Linsey and she is in agreement with the aforementioned.   This note is electronically signed by: Doy Mince 07/24/2015 6:33 PM

## 2015-07-24 NOTE — Patient Instructions (Signed)
[place] at [facility] Discharge Instructions  RECOMMENDATIONS MADE BY THE CONSULTANT AND ANY TEST RESULTS WILL BE SENT TO YOUR REFERRING PHYSICIAN.  Exam done and seen today by Kirby Crigler Smoking cessation info given No labs today Labs in 3 months Return to see the Doctor in 3 months Call the clinic for any concerns or questions.  Thank you for choosing [place] at [facility] to provide your oncology and hematology care.  To afford each patient quality time with our provider, please arrive at least 15 minutes before your scheduled appointment time.   Beginning January 23rd 2017 lab work for the [facility] will be done in the  Main lab at [facility] on 1st floor. If you have a lab appointment with the [facility] please come in thru the  Main Entrance and check in at the main information desk  You need to re-schedule your appointment should you arrive 10 or more minutes late.  We strive to give you quality time with our providers, and arriving late affects you and other patients whose appointments are after yours.  Also, if you no show three or more times for appointments you may be dismissed from the clinic at the providers discretion.     Again, thank you for choosing [facility].  Our hope is that these requests will decrease the amount of time that you wait before being seen by our physicians.       _____________________________________________________________  Should you have questions after your visit to [facility], please contact our office at [phone] between the hours of 8:30 a.m. and 4:30 p.m.  Voicemails left after 4:30 p.m. will not be returned until the following business day.  For prescription refill requests, have your pharmacy contact our office.         Resources For Cancer Patients and their Caregivers ? American Cancer Society: Can assist with transportation, wigs, general  needs, runs Look Good Feel Better.        (973)476-3044 ? Cancer Care: Provides financial assistance, online support groups, medication/co-pay assistance.  1-800-813-HOPE 6267301306) ? [place] Assists [place] cancer patients and their families through emotional , educational and financial support.  (972)599-1929 ? [place] DSS Where to apply for food stamps, Medicaid and utility assistance. 551-849-1878 ? [facility]: Transportation to medical appointments. 781-577-8904 ? Social Security Administration: May apply for disability if have a Stage IV cancer. 9707690466 (562) 501-1074 ? [employer], Disability and Transit Services: Assists with nutrition, care and transit needs. [place] Support Programs: @10RELATIVEDAYS @ > Cancer Support Group  2nd Tuesday of the month 1pm-2pm, Journey Room  > Creative Journey  3rd Tuesday of the month 1130am-1pm, Journey Room  > Look Good Feel Better  1st Wednesday of the month 10am-12 noon, Journey Room (Call [place] to register [phone])

## 2015-07-24 NOTE — Assessment & Plan Note (Addendum)
Leukocytosis with neutrophilia dating back to at least 2013 with negative peripheral work-up including: normal ESR/CRP, negative JAK2 V617F mutation, JAK2 exon 12 mutation, CALR mutation, MPL mutation, and no monoclonal B cell population or abnormal T cell phenotype on peripheral FLOW cytometry.  I reviewed the potential causes of leukocytosis (specifically mild neutrophilia) including but not limited to: ?Any active inflammatory condition or infection ?Cigarette smoking, which may be the most common cause of mild neutrophilia ?Previously diagnosed hematologic disease (such as acute and chronic leukemias, chronic myeloproliferative or myelodysplastic disease) ?The presence of, and treatment for, a chronic anxiety state, panic disorder, rage, or emotional stress (eg, posttraumatic stress disorder, depression) ?Presence of non-hematologic diseases known to increase neutrophil counts (eg, eclampsia, thyroid storm, hypercortisolism). ?Prior splenectomy or known asplenia ?Positive family history of neutrophilia ?Recent vaccination  ?Medications - Various medications may cause neutrophilia. However, such cases are rare and appear in the literature as isolated case reports.   Peripheral work-up has been negative.  She is smoking, but has cut back to 1/2 ppd.  I have provided her smoking cessation education.  She is given information regarding smoking cessation class.  Indications for bone marrow aspiration and biopsy:  A. Unexplained anemia  B. Macrocytic anemia(to distinguish megaloblastic from normoblastic maturation)  C. Unexplained leukopenia  D. Unexplained thrombocytopenia  E. Pancytopenia  F. Presence of blasts on peripheral smear (investigation for possible leukemia)  G. Presence of teardrop red cells on peripheral smear (possible myelofibrosis)  H. Presence of hairy cells on peripheral smear (possible hairy cell leukemia)  I. Suspected Multiple Myeloma  J. Staging for non-Hodgkin's  Lymphoma  K. Unexplained splenomegaly (possible splenomegaly)  L. Suspected storage disease (eg: Gaucher disease, Niemann-Pick)  M. Fever of unknown origin  N. Suspected chromosomal disorders in neonates (requiring rapid confirmation).  O. Confirmation of normal marrow in potential allogenic donor  P. Work-up for amyloidosis (to detect clonal plasma cell disorder)   At this time, I do not feel a need for a bone marrow aspiration and biopsy.  We will continue to monitor her counts.  She denies any B symptoms.  Labs in 3 months: CBC diff  Return in 3 months for follow-up.

## 2015-07-29 ENCOUNTER — Encounter: Payer: Self-pay | Admitting: Physical Therapy

## 2015-07-29 ENCOUNTER — Ambulatory Visit: Payer: Medicaid Other | Admitting: Physical Therapy

## 2015-07-29 DIAGNOSIS — M25512 Pain in left shoulder: Secondary | ICD-10-CM

## 2015-07-29 DIAGNOSIS — M25612 Stiffness of left shoulder, not elsewhere classified: Secondary | ICD-10-CM

## 2015-07-29 NOTE — Therapy (Signed)
[facility] [address], [place], [address] Phone: [phone]   Fax:  [phone]  Physical Therapy Treatment  Patient Details  Name: Penny Moreno MRN: [id number] Date of Birth: [birth year] Referring Provider: Esmond Plants MD  Encounter Date: 07/29/2015      PT End of Session - 07/29/15 1237    Visit Number 3   Number of Visits 16   Date for PT Re-Evaluation 09/12/15   PT Start Time 1229   PT Stop Time 1310   PT Time Calculation (min) 41 min   Activity Tolerance Patient tolerated treatment well   Behavior During Therapy [facility] for tasks assessed/performed      Past Medical History  Diagnosis Date  . Anxiety   . Depression   . Arthritis   . OCD (obsessive compulsive disorder)   . Eating disorder   . Panic disorder   . Neuromuscular disorder (HCC)     Fibromyalgia  . Bipolar disorder ([place])   . Vaginal Pap smear, abnormal   . GERD (gastroesophageal reflux disease)   . Frozen shoulder   . Back pain   . Leukocytosis 07/02/2015  . Vocal cord polyps     Past Surgical History  Procedure Laterality Date  . Diagnostic laparoscopy      [place]  . Tonsillectomy    . Laparoscopic tubal ligation  10/30/2011    Procedure: LAPAROSCOPIC TUBAL LIGATION;  Surgeon: Jonnie Kind, MD;  Location: [facility];  Service: Gynecology;  Laterality: Bilateral;  Laparoscopic Bilateral tubal ligation with falope rings  . Tubal ligation  10/04/2011    There were no vitals filed for this visit.      Subjective Assessment - 07/29/15 1234    Subjective Patient went to MD and is to continue therapy 1xper week    Limitations House hold activities   Patient Stated Goals Want to regain use of my left UE.   Currently in Pain? Yes   Pain Score 6    Pain Location Shoulder   Pain Orientation Left   Pain Descriptors / Indicators Aching   Pain Type Surgical pain   Pain Onset In the past 7 days   Pain Frequency Constant   Aggravating  Factors  shoulder movement   Pain Relieving Factors at rest            [facility] PT Assessment - 07/29/15 0001    PROM   Overall PROM  Deficits   Overall PROM Comments Flexion 146 degrees, ER 40 degrees                      [facility] Adult PT Treatment/Exercise - 07/29/15 0001    Exercises   Exercises Shoulder   Shoulder Exercises: Supine   Other Supine Exercises self AAROM for ER and flexion 2x10 each   Manual Therapy   Manual Therapy Passive ROM   Manual therapy comments In supine performed left shoulder P/AAROM into flexion and ER                      PT Long Term Goals - 07/29/15 1253    PT LONG TERM GOAL #1   Title Independent with a HEP.   Baseline No knowledge of appropriate ther ex progression.   Time 8   Period Weeks   Status Partially Met  met according to progression at this time 07/29/15   PT LONG TERM GOAL #2   Title Active left shoulder flexion to  150 degrees so the patient can easily reach overhead   Baseline 105 degrees of flexion.   Time 8   Period Weeks   Status On-going   PT LONG TERM GOAL #3   Title Active ER to 70 degrees+ to allow for easily donning/doffing of apparel   Baseline 20 degrees of ER.   Time 8   Period Weeks   Status On-going   PT LONG TERM GOAL #4   Title Active behind the back to L2   Baseline 65 degrees of IR.   Time 8   Period Weeks   Status On-going   PT LONG TERM GOAL #5   Title Patient perform ADL's with pain not > 3/10.   Baseline Cannot use left UE for performance of ADL's.   Time 8   Period Weeks   Status On-going               Plan - 07/29/15 1239    Clinical Impression Statement Patient progressing with P/AAROM today and has improved ROM.Reviewed self AAROM for home self Flexion and ER toady. Patient had good technique with AAROM ROM. Patient goals ongoing due to ROM, strength and pain deficits and protocol limitations   Rehab Potential Good   Clinical Impairments Affecting Rehab  Potential sugury 07/17/15 / 2 weeks 07/31/15   PT Frequency 2x / week   PT Duration 8 weeks   PT Treatment/Interventions ADLs/Self Care Home Management;Cryotherapy;Electrical Stimulation;Therapeutic exercise;Therapeutic activities;Ultrasound;Patient/family education;Manual techniques;Passive range of motion   PT Next Visit Plan cont with POC for PAROM; AAROM to patient's left shoulder (Patient needs HEP progression as she only has 1 visit left next week and isurance will not cover)   Consulted and Agree with Plan of Care Patient      Patient will benefit from skilled therapeutic intervention in order to improve the following deficits and impairments:  Pain, Decreased activity tolerance, Decreased range of motion  Visit Diagnosis: Pain in left shoulder  Stiffness of left shoulder, not elsewhere classified     Problem List Patient Active Problem List   Diagnosis Date Noted  . Leukocytosis 07/02/2015  . CIN I (cervical intraepithelial neoplasia I) 11/10/2014  . Hypertriglyceridemia 10/03/2014  . Hyperlipidemia 10/02/2014  . GAD (generalized anxiety disorder) 10/02/2014  . Arthritis 10/02/2014  . Fibromyalgia 10/02/2014  . Pap smear abnormality of cervix with LGSIL 09/26/2014  . Osteopenia 09/06/2013  . Depression 07/27/2012  . Sterilization Fallope rings 10/30/2011    Dineen Conradt P, PTA 07/29/2015, 1:11 PM  [facility] [address] [place], [place], [address] Phone: [phone]   Fax:  [phone]  Name: Penny Moreno MRN: [id number] Date of Birth: 1965/02/16

## 2015-07-31 ENCOUNTER — Encounter: Payer: Medicaid Other | Admitting: Physical Therapy

## 2015-08-08 ENCOUNTER — Ambulatory Visit (INDEPENDENT_AMBULATORY_CARE_PROVIDER_SITE_OTHER): Payer: Medicaid Other | Admitting: Otolaryngology

## 2015-08-08 DIAGNOSIS — R490 Dysphonia: Secondary | ICD-10-CM | POA: Diagnosis not present

## 2015-08-08 DIAGNOSIS — J381 Polyp of vocal cord and larynx: Secondary | ICD-10-CM | POA: Diagnosis not present

## 2015-08-14 ENCOUNTER — Ambulatory Visit: Payer: Medicaid Other | Attending: Orthopedic Surgery | Admitting: Physical Therapy

## 2015-08-14 ENCOUNTER — Encounter: Payer: Self-pay | Admitting: Physical Therapy

## 2015-08-14 DIAGNOSIS — M25612 Stiffness of left shoulder, not elsewhere classified: Secondary | ICD-10-CM | POA: Insufficient documentation

## 2015-08-14 DIAGNOSIS — M25512 Pain in left shoulder: Secondary | ICD-10-CM

## 2015-08-14 NOTE — Therapy (Addendum)
[facility] [place], [place], [address] Phone: [phone]   Fax:  [phone]  Physical Therapy Treatment  Patient Details  Name: Penny Moreno MRN: [id number] Date of Birth: 12-21-1965 Referring Provider: Esmond Plants MD  Encounter Date: 08/14/2015      PT End of Session - 08/14/15 1304    Visit Number 4   Number of Visits 16   Date for PT Re-Evaluation 09/12/15   PT Start Time 1229   PT Stop Time 1311   PT Time Calculation (min) 42 min   Activity Tolerance Patient tolerated treatment well   Behavior During Therapy [facility] for tasks assessed/performed      Past Medical History  Diagnosis Date  . Anxiety   . Depression   . Arthritis   . OCD (obsessive compulsive disorder)   . Eating disorder   . Panic disorder   . Neuromuscular disorder (HCC)     Fibromyalgia  . Bipolar disorder ([place])   . Vaginal Pap smear, abnormal   . GERD (gastroesophageal reflux disease)   . Frozen shoulder   . Back pain   . Leukocytosis 07/02/2015  . Vocal cord polyps     Past Surgical History  Procedure Laterality Date  . Diagnostic laparoscopy      [place]  . Tonsillectomy    . Laparoscopic tubal ligation  10/30/2011    Procedure: LAPAROSCOPIC TUBAL LIGATION;  Surgeon: Jonnie Kind, MD;  Location: [facility];  Service: Gynecology;  Laterality: Bilateral;  Laparoscopic Bilateral tubal ligation with falope rings  . Tubal ligation  10/04/2011    There were no vitals filed for this visit.      Subjective Assessment - 08/14/15 1235    Subjective Patient doing what she can at home to move arm with some pain the more she moves it   Limitations House hold activities   Patient Stated Goals Want to regain use of my left UE.   Currently in Pain? Yes   Pain Score 4    Pain Location Shoulder   Pain Orientation Left   Pain Descriptors / Indicators Aching   Pain Type Surgical pain   Pain Onset More than a month ago   Pain Frequency  Constant   Aggravating Factors  prolong movement of shoulder   Pain Relieving Factors at rest            [facility] PT Assessment - 08/14/15 0001    ROM / Strength   AROM / PROM / Strength AROM;PROM   AROM   Overall AROM  Deficits   AROM Assessment Site Shoulder   Right/Left Shoulder Left   Left Shoulder Flexion 78 Degrees   Left Shoulder External Rotation 50 Degrees   PROM   Overall PROM  Deficits   Overall PROM Comments Flexion 155 degrees, ER 70 degrees                      [facility] Adult PT Treatment/Exercise - 08/14/15 0001    Shoulder Exercises: Supine   Other Supine Exercises self AAROM for ER and flexion/chest press 2x10 each   Shoulder Exercises: Standing   Other Standing Exercises 4 way isometrics x5 each way (two finger for IR/ER)   Shoulder Exercises: Pulleys   Flexion 3 minutes  AAROM   Manual Therapy   Manual Therapy Passive ROM   Manual therapy comments In supine performed left shoulder P/AAROM into flexion and ER , then gentle rhythmic stabs  PT Education - 08/14/15 1248    Education provided Yes   Education Details HEP   Person(s) Educated Patient   Methods Explanation;Demonstration;Handout   Comprehension Verbalized understanding;Returned demonstration             PT Long Term Goals - 08/14/15 1249    PT LONG TERM GOAL #1   Title Independent with a HEP.   Baseline No knowledge of appropriate ther ex progression.   Time 8   Period Weeks   Status Achieved   PT LONG TERM GOAL #2   Title Active left shoulder flexion to 150 degrees so the patient can easily reach overhead   Baseline 105 degrees of flexion.   Time 8   Period Weeks   Status On-going   PT LONG TERM GOAL #3   Title Active ER to 70 degrees+ to allow for easily donning/doffing of apparel   Baseline 20 degrees of ER.   Time 8   Period Weeks   Status On-going   PT LONG TERM GOAL #4   Title Active behind the back to L2   Baseline 65 degrees of IR.    Time 8   Period Weeks   Status On-going   PT LONG TERM GOAL #5   Title Patient perform ADL's with pain not > 3/10.   Baseline Cannot use left UE for performance of ADL's.   Time 8   Period Weeks   Status On-going               Plan - 08/14/15 1313    Clinical Impression Statement Patient progressing well with he ROM and healing overall. Patient is limited with visits and unable to come to therapy again until she hears back from [facility] for approval. Patient was given HEP for exercises appropriate for heling process at this time per protocol. Patient is going back to [facility]. Vever[facility]  in august and will progress per instructed. patient is 4 weeks post op today. Patient  met LTG #1 others ongoing due to ROM, strength and pain limitations due to healing and protocol.   Rehab Potential Good   Clinical Impairments Affecting Rehab Potential sugury 07/17/15 / 4 weeks 08/14/15   PT Frequency 2x / week   PT Duration 8 weeks   PT Treatment/Interventions ADLs/Self Care Home Management;Cryotherapy;Electrical Stimulation;Therapeutic exercise;Therapeutic activities;Ultrasound;Patient/family education;Manual techniques;Passive range of motion   PT Next Visit Plan patient on hold due to visits and awaiting approval from [facility]   Consulted and Agree with Plan of Care Patient      Patient will benefit from skilled therapeutic intervention in order to improve the following deficits and impairments:  Pain, Decreased activity tolerance, Decreased range of motion  Visit Diagnosis: Pain in left shoulder  Stiffness of left shoulder, not elsewhere classified     Problem List Patient Active Problem List   Diagnosis Date Noted  . Leukocytosis 07/02/2015  . CIN I (cervical intraepithelial neoplasia I) 11/10/2014  . Hypertriglyceridemia 10/03/2014  . Hyperlipidemia 10/02/2014  . GAD (generalized anxiety disorder) 10/02/2014  . Arthritis 10/02/2014  . Fibromyalgia 10/02/2014  . Pap smear  abnormality of cervix with LGSIL 09/26/2014  . Osteopenia 09/06/2013  . Depression 07/27/2012  . Sterilization Fallope rings 10/30/2011    Terrence Wishon P, PTA 08/14/2015, 1:21 PM  [facility] [address] [place], [place], [address] Phone: [phone]   Fax:  [phone]  Name: Penny Moreno MRN: [id number] Date of Birth: Sep 16, 1965  PHYSICAL THERAPY DISCHARGE SUMMARY  Visits  from Start of Care: 4.  Current functional level related to goals / functional outcomes: See above.   Remaining deficits: Continued loss of left shoulder ROM.    Education / Equipment:  Plan: Patient agrees to discharge.  Patient goals were not met. Patient is being discharged due to not returning since the last visit.  ?????         Mali Applegate MPT

## 2015-08-14 NOTE — Patient Instructions (Signed)
Strengthening: Isometric Flexion   Using wall for resistance, press right fist into ball using light pressure. Hold __5__ seconds. Repeat __10__ times per set. Do __2__ sets per session. Do _2___ sessions per day.   Strengthening: Isometric Extension   Using wall for resistance, press back of left arm into ball using light pressure. Hold _5___ seconds. Repeat __10__ times per set. Do __2__ sets per session. Do __2__ sessions per day.   Strengthening: Isometric External Rotation   Using wall to provide resistance, and keeping right arm at side, press back of hand into ball using light pressure (use two fingers on wall). Hold __5__ seconds. Repeat __10__ times per set. Do _2___ sets per session. Do __2__ sessions per day.    Strengthening: Isometric Internal Rotation   Using door frame for resistance, press palm of right hand into ball using light pressure. (use two fingers on wall) Keep elbow in at side. Hold _5___ seconds. Repeat __10__ times per set. Do _2___ sets per session. Do __2__ sessions per day.  ROM: External / Internal Rotation - Wand   Holding wand with left hand palm up, push out from body with other hand, palm down. Keep both elbows bent. When stretch is felt, hold _5___ seconds. Repeat to other side, leading with same hand. Keep elbows bent. Repeat __10__ times per set. Do __2-3__ sets per session. Do __2__ sessions per day.   ROM: Flexion - Wand (Supine)   Lie on back holding wand. Raise arms over head.  Repeat __10__ times per set. Do _2-3___ sets per session. Do _2___ sessions per day.

## 2015-09-13 ENCOUNTER — Telehealth: Payer: Self-pay | Admitting: Family

## 2015-09-13 DIAGNOSIS — M549 Dorsalgia, unspecified: Secondary | ICD-10-CM

## 2015-09-13 NOTE — Telephone Encounter (Signed)
Pt wants referral to [facility] for back pain Please review and advise

## 2015-09-13 NOTE — Telephone Encounter (Signed)
Yes that is fine if she wants to go see another orthopedic then she can. We can call it going for a second opinion

## 2015-09-13 NOTE — Telephone Encounter (Signed)
Patient aware.

## 2015-10-24 ENCOUNTER — Other Ambulatory Visit (HOSPITAL_COMMUNITY): Payer: Medicaid Other

## 2015-10-24 ENCOUNTER — Ambulatory Visit (HOSPITAL_COMMUNITY): Payer: Medicaid Other | Admitting: Oncology

## 2015-11-01 ENCOUNTER — Encounter: Payer: Self-pay | Admitting: Family Medicine

## 2015-11-01 ENCOUNTER — Ambulatory Visit (INDEPENDENT_AMBULATORY_CARE_PROVIDER_SITE_OTHER): Payer: Medicaid Other | Admitting: Family Medicine

## 2015-11-01 VITALS — BP 128/82 | HR 98 | Temp 98.4°F | Ht 63.5 in | Wt 147.2 lb

## 2015-11-01 DIAGNOSIS — D473 Essential (hemorrhagic) thrombocythemia: Secondary | ICD-10-CM

## 2015-11-01 DIAGNOSIS — Z23 Encounter for immunization: Secondary | ICD-10-CM

## 2015-11-01 DIAGNOSIS — G8929 Other chronic pain: Secondary | ICD-10-CM | POA: Diagnosis not present

## 2015-11-01 DIAGNOSIS — D72829 Elevated white blood cell count, unspecified: Secondary | ICD-10-CM | POA: Diagnosis not present

## 2015-11-01 DIAGNOSIS — M545 Low back pain, unspecified: Secondary | ICD-10-CM

## 2015-11-01 DIAGNOSIS — E785 Hyperlipidemia, unspecified: Secondary | ICD-10-CM | POA: Diagnosis not present

## 2015-11-01 DIAGNOSIS — R7989 Other specified abnormal findings of blood chemistry: Secondary | ICD-10-CM

## 2015-11-01 NOTE — Progress Notes (Signed)
BP 128/82   Pulse 98   Temp 98.4 F (36.9 C) (Oral)   Ht 5' 3.5" (1.613 m)   Wt 147 lb 4 oz (66.8 kg)   LMP 09/03/2011   BMI 25.68 kg/m    Subjective:    Patient ID: Penny Moreno, female    DOB: [birth year], 50 y.o.   MRN: [id number]  HPI: Penny Moreno is a 50 y.o. female presenting on 11/01/2015 for Hyperlipidemia (followup, labs - patient is not fasting) and Referral for pain management   HPI Hyperlipidemia recheck Patient comes in for cholesterol recheck. She is currently on Lipitor and TriCor. She denies any issues with myalgias. Patient denies headaches, blurred vision, chest pains, shortness of breath, or weakness. Denies any side effects from medication and is content with current medication. She is due for recheck of her labs but is not fasting today. She fights issues with her cholesterol because she is on antipsychotics that increase issues with this.  Chronic low back pain Patient comes in with chronic bilateral low back pain that she has had for quite some time. She has seen orthopedic and initially thought they would do the spinal injections like she has had previously but when they said that they could not do it she is coming in today to get a referral for pain management so that he can do the injections. She would like a refill on her Flexeril to help with her back and neck pain that she has chronically.  Relevant past medical, surgical, family and social history reviewed and updated as indicated. Interim medical history since our last visit reviewed. Allergies and medications reviewed and updated.  Review of Systems  Constitutional: Negative for chills and fever.  HENT: Negative for congestion, ear discharge and ear pain.   Eyes: Negative for redness and visual disturbance.  Respiratory: Negative for chest tightness and shortness of breath.   Cardiovascular: Negative for chest pain and leg swelling.  Genitourinary: Negative for difficulty urinating and  dysuria.  Musculoskeletal: Positive for back pain. Negative for arthralgias and gait problem.  Skin: Negative for rash.  Neurological: Negative for light-headedness and headaches.  Psychiatric/Behavioral: Negative for agitation and behavioral problems.  All other systems reviewed and are negative.   Per HPI unless specifically indicated above     Medication List       Accurate as of 11/01/15  3:50 PM. Always use your most recent med list.          ALPRAZolam 0.5 MG tablet Commonly known as:  XANAX Take 1 tablet (0.5 mg total) by mouth daily as needed. For anxiety   atorvastatin 20 MG tablet Commonly known as:  LIPITOR Take 1 tablet (20 mg total) by mouth daily.   BENEFIBER Powd Take 2 each by mouth daily.   BETA CAROTENE PO Take by mouth daily.   BIOTIN PO Take by mouth daily.   cyclobenzaprine 10 MG tablet Commonly known as:  FLEXERIL Take 1 tablet (10 mg total) by mouth 3 (three) times daily as needed for muscle spasms.   fenofibrate 145 MG tablet Commonly known as:  TRICOR Take 1 tablet (145 mg total) by mouth daily.   FLUoxetine 20 MG tablet Commonly known as:  PROZAC Take 20 mg by mouth daily.   fluticasone 50 MCG/ACT nasal spray Commonly known as:  FLONASE Place 1 spray into both nostrils 2 (two) times daily as needed for allergies or rhinitis.   gabapentin 300 MG capsule Commonly known as:  NEURONTIN  Take 1 capsule (300 mg total) by mouth at bedtime.   ibuprofen 800 MG tablet Commonly known as:  ADVIL,MOTRIN Take 1 tablet (800 mg total) by mouth every 6 (six) hours as needed.   lurasidone 40 MG Tabs tablet Commonly known as:  LATUDA Take 40 mg by mouth daily with breakfast. 1/2 to 1 tablet tablet   omeprazole 20 MG capsule Commonly known as:  PRILOSEC Take 2 capsules (40 mg total) by mouth daily.   PROBIOTIC PEARLS PO Take 1 capsule by mouth daily.   vitamin C 100 MG tablet Take 100 mg by mouth daily. Reported on 05/21/2015   Vitamin D3  5000 units Tabs Take by mouth. Two tablets per day   vitamin E 100 UNIT capsule Take 100 Units by mouth daily.          Objective:    BP 128/82   Pulse 98   Temp 98.4 F (36.9 C) (Oral)   Ht 5' 3.5" (1.613 m)   Wt 147 lb 4 oz (66.8 kg)   LMP 09/03/2011   BMI 25.68 kg/m   Wt Readings from Last 3 Encounters:  11/01/15 147 lb 4 oz (66.8 kg)  07/02/15 142 lb (64.4 kg)  05/21/15 147 lb 8 oz (66.9 kg)    Physical Exam  Constitutional: She is oriented to person, place, and time. She appears well-developed and well-nourished. No distress.  Eyes: Conjunctivae are normal.  Cardiovascular: Normal rate, regular rhythm, normal heart sounds and intact distal pulses.   No murmur heard. Pulmonary/Chest: Effort normal and breath sounds normal. No respiratory distress. She has no wheezes.  Musculoskeletal: Normal range of motion. She exhibits no edema.       Lumbar back: She exhibits tenderness (Bilateral lower lumbar tenderness). She exhibits normal range of motion, no bony tenderness and normal pulse.  Neurological: She is alert and oriented to person, place, and time. Coordination normal.  Skin: Skin is warm and dry. No rash noted. She is not diaphoretic.  Psychiatric: She has a normal mood and affect. Her behavior is normal.  Nursing note and vitals reviewed.     Assessment & Plan:   Problem List Items Addressed This Visit      Other   Hyperlipidemia - Primary   Relevant Orders   Lipid panel (Completed)   CBC with Differential/Platelet (Completed)   CMP14+EGFR (Completed)    Other Visit Diagnoses    Encounter for immunization       Chronic low back pain       Has had injections previously and wants somebody that can do spinal injections, refer to Dr. Merlene Laughter   Relevant Orders   Ambulatory referral to Pain Clinic       Follow up plan: Return in about 6 months (around 04/30/2016), or if symptoms worsen or fail to improve, for Recheck cholesterol.  Counseling provided  for all of the vaccine components Orders Placed This Encounter  Procedures  . Flu Vaccine QUAD 36+ mos IM  . Lipid panel  . CBC with Differential/Platelet  . CMP14+EGFR  . Ambulatory referral to [place], MD [facility] 11/01/2015, 3:50 PM

## 2015-11-02 LAB — CBC WITH DIFFERENTIAL/PLATELET
BASOS: 1 %
Basophils Absolute: 0.1 10*3/uL (ref 0.0–0.2)
EOS (ABSOLUTE): 0.3 10*3/uL (ref 0.0–0.4)
EOS: 2 %
HEMATOCRIT: 41.9 % (ref 34.0–46.6)
Hemoglobin: 14.4 g/dL (ref 11.1–15.9)
IMMATURE GRANULOCYTES: 0 %
Immature Grans (Abs): 0 10*3/uL (ref 0.0–0.1)
LYMPHS: 30 %
Lymphocytes Absolute: 3.8 10*3/uL — ABNORMAL HIGH (ref 0.7–3.1)
MCH: 31.7 pg (ref 26.6–33.0)
MCHC: 34.4 g/dL (ref 31.5–35.7)
MCV: 92 fL (ref 79–97)
Monocytes Absolute: 0.6 10*3/uL (ref 0.1–0.9)
Monocytes: 4 %
NEUTROS PCT: 63 %
Neutrophils Absolute: 8.1 10*3/uL — ABNORMAL HIGH (ref 1.4–7.0)
PLATELETS: 447 10*3/uL — AB (ref 150–379)
RBC: 4.54 x10E6/uL (ref 3.77–5.28)
RDW: 13.1 % (ref 12.3–15.4)
WBC: 12.8 10*3/uL — ABNORMAL HIGH (ref 3.4–10.8)

## 2015-11-02 LAB — CMP14+EGFR
A/G RATIO: 2 (ref 1.2–2.2)
ALT: 28 IU/L (ref 0–32)
AST: 25 IU/L (ref 0–40)
Albumin: 4.8 g/dL (ref 3.5–5.5)
Alkaline Phosphatase: 72 IU/L (ref 39–117)
BUN/Creatinine Ratio: 12 (ref 9–23)
BUN: 7 mg/dL (ref 6–24)
Bilirubin Total: <0.2 mg/dL (ref 0.0–1.2)
CALCIUM: 9.8 mg/dL (ref 8.7–10.2)
CO2: 23 mmol/L (ref 18–29)
Chloride: 101 mmol/L (ref 96–106)
Creatinine, Ser: 0.59 mg/dL (ref 0.57–1.00)
GFR, EST AFRICAN AMERICAN: 125 mL/min/{1.73_m2} (ref >59)
GFR, EST NON AFRICAN AMERICAN: 109 mL/min/{1.73_m2} (ref >59)
GLOBULIN, TOTAL: 2.4 g/dL (ref 1.5–4.5)
Glucose: 98 mg/dL (ref 65–99)
POTASSIUM: 4.1 mmol/L (ref 3.5–5.2)
Sodium: 141 mmol/L (ref 134–144)
TOTAL PROTEIN: 7.2 g/dL (ref 6.0–8.5)

## 2015-11-02 LAB — LIPID PANEL
CHOLESTEROL TOTAL: 214 mg/dL — AB (ref 100–199)
Chol/HDL Ratio: 5.5 ratio units — ABNORMAL HIGH (ref 0.0–4.4)
HDL: 39 mg/dL — ABNORMAL LOW (ref >39)
LDL Calculated: 128 mg/dL — ABNORMAL HIGH (ref 0–99)
Triglycerides: 235 mg/dL — ABNORMAL HIGH (ref 0–149)
VLDL Cholesterol Cal: 47 mg/dL — ABNORMAL HIGH (ref 5–40)

## 2015-11-04 MED ORDER — ATORVASTATIN CALCIUM 40 MG PO TABS: 40.0000 mg | ORAL_TABLET | Freq: Every day | ORAL | 1 refills | Status: DC

## 2015-11-04 NOTE — Addendum Note (Signed)
Addended by: Thana Ates on: 11/04/2015 09:21 AM   Modules accepted: Orders

## 2015-11-20 ENCOUNTER — Ambulatory Visit (HOSPITAL_COMMUNITY): Payer: Medicaid Other | Admitting: Oncology

## 2015-11-20 NOTE — Assessment & Plan Note (Deleted)
Leukocytosis with neutrophilia dating back to at least 2013 with negative peripheral work-up including: normal ESR/CRP, negative JAK2 V617F mutation, JAK2 exon 12 mutation, CALR mutation, MPL mutation, and no monoclonal B cell population or abnormal T cell phenotype on peripheral FLOW cytometry.  I reviewed the potential causes of leukocytosis (specifically mild neutrophilia) including but not limited to: ?Any active inflammatory condition or infection ?Cigarette smoking, which may be the most common cause of mild neutrophilia ?Previously diagnosed hematologic disease (such as acute and chronic leukemias, chronic myeloproliferative or myelodysplastic disease) ?The presence of, and treatment for, a chronic anxiety state, panic disorder, rage, or emotional stress (eg, posttraumatic stress disorder, depression) ?Presence of non-hematologic diseases known to increase neutrophil counts (eg, eclampsia, thyroid storm, hypercortisolism). ?Prior splenectomy or known asplenia ?Positive family history of neutrophilia ?Recent vaccination  ?Medications - Various medications may cause neutrophilia. However, such cases are rare and appear in the literature as isolated case reports.   Peripheral work-up has been negative.  She is smoking, but has cut back to 1/2 ppd.  I have provided her smoking cessation education.  She is given information regarding smoking cessation class.  At this time, I do not feel a need for a bone marrow aspiration and biopsy.  We will continue to monitor her counts.  She denies any B symptoms.  Labs in 3 months: CBC diff  Return in 3 months for follow-up.

## 2015-11-20 NOTE — Progress Notes (Deleted)
Penny Moreno, [place] [id number]  No diagnosis found.  CURRENT THERAPY: Observation of blood counts  INTERVAL HISTORY: Penny Moreno 50 y.o. female returns for followup of leukocytosis with neutrophilia dating back to at least 2013 with negative peripheral work-up including: normal ESR/CRP, negative JAK2 V617F mutation, JAK2 exon 12 mutation, CALR mutation, MPL mutation, and no monoclonal B cell population or abnormal T cell phenotype on peripheral FLOW cytometry.    Review of Systems  Constitutional: Negative for chills, fever and weight loss.  HENT: Negative.   Eyes: Negative.   Respiratory: Negative.   Cardiovascular: Negative.   Gastrointestinal: Negative for abdominal pain.  Genitourinary: Negative.   Musculoskeletal: Positive for joint pain (Left shoulder pain, S/P surgery.).  Skin: Negative.   Neurological: Negative.   Endo/Heme/Allergies: Negative.   Psychiatric/Behavioral: Negative.     Past Medical History:  Diagnosis Date  . Anxiety   . Arthritis   . Back pain   . Bipolar disorder ([place])   . Depression   . Eating disorder   . Frozen shoulder   . GERD (gastroesophageal reflux disease)   . Leukocytosis 07/02/2015  . Neuromuscular disorder ([facility])    Fibromyalgia  . OCD (obsessive compulsive disorder)   . Panic disorder   . Vaginal Pap smear, abnormal   . Vocal cord polyps     Past Surgical History:  Procedure Laterality Date  . DIAGNOSTIC LAPAROSCOPY     [place]  . LAPAROSCOPIC TUBAL LIGATION  10/30/2011   Procedure: LAPAROSCOPIC TUBAL LIGATION;  Surgeon: Jonnie Kind, MD;  Location: [facility];  Service: Gynecology;  Laterality: Bilateral;  Laparoscopic Bilateral tubal ligation with falope rings  . TONSILLECTOMY    . TUBAL LIGATION  10/04/2011    Family History  Problem Relation Age of Onset  . Hyperlipidemia Mother   . Hypertension Mother   . Hyperlipidemia Father   . Bipolar disorder Sister   . Cancer  Maternal Grandmother     breast cancer  . Heart disease Maternal Grandfather 74    Social History   Social History  . Marital status: Legally Separated    Spouse name: N/A  . Number of children: N/A  . Years of education: N/A   Social History Main Topics  . Smoking status: Current Every Day Smoker    Packs/day: 0.50    Years: 30.00    Types: Cigarettes  . Smokeless tobacco: Never Used  . Alcohol use No  . Drug use: No  . Sexual activity: Yes    Birth control/ protection: None, Post-menopausal   Other Topics Concern  . Not on file   Social History Narrative  . No narrative on file     PHYSICAL EXAMINATION  ECOG PERFORMANCE STATUS: 0 - Asymptomatic  There were no vitals filed for this visit.   GENERAL:alert, no distress, well nourished, well developed, anxious, comfortable, cooperative, smiling and chronically ill appearing and appearing older than stated age. SKIN: skin color, texture, turgor are normal, no rashes or significant lesions HEAD: Normocephalic, No masses, lesions, tenderness or abnormalities EYES: normal, EOMI, Conjunctiva are pink and non-injected EARS: External ears normal OROPHARYNX:lips, buccal mucosa, and tongue normal and mucous membranes are moist  NECK: supple, no adenopathy, thyroid normal size, non-tender, without nodularity, trachea midline LYMPH:  not examined BREAST:not examined LUNGS: clear to auscultation  HEART: regular rate & rhythm ABDOMEN:abdomen soft, non-tender and normal bowel sounds BACK: Back symmetric, no curvature. EXTREMITIES:less then 2  second capillary refill, no joint deformities, effusion, or inflammation, no skin discoloration, no cyanosis  NEURO: alert & oriented x 3 with fluent speech, no focal motor/sensory deficits, gait normal   LABORATORY DATA: CBC    Component Value Date/Time   WBC 12.8 (H) 11/01/2015 1603   WBC 13.3 (H) 07/02/2015 1224   RBC 4.54 11/01/2015 1603   RBC 4.65 07/02/2015 1224   HGB 14.6  07/02/2015 1224   HCT 41.9 11/01/2015 1603   PLT 447 (H) 11/01/2015 1603   MCV 92 11/01/2015 1603   MCH 31.7 11/01/2015 1603   MCH 31.4 07/02/2015 1224   MCHC 34.4 11/01/2015 1603   MCHC 33.5 07/02/2015 1224   RDW 13.1 11/01/2015 1603   LYMPHSABS 3.8 (H) 11/01/2015 1603   MONOABS 0.4 07/02/2015 1224   EOSABS 0.3 11/01/2015 1603   BASOSABS 0.1 11/01/2015 1603      Chemistry      Component Value Date/Time   NA 141 11/01/2015 1603   K 4.1 11/01/2015 1603   CL 101 11/01/2015 1603   CO2 23 11/01/2015 1603   BUN 7 11/01/2015 1603   CREATININE 0.59 11/01/2015 1603   CREATININE 0.65 08/23/2012 1227      Component Value Date/Time   CALCIUM 9.8 11/01/2015 1603   ALKPHOS 72 11/01/2015 1603   AST 25 11/01/2015 1603   ALT 28 11/01/2015 1603   BILITOT <0.2 11/01/2015 1603        PENDING LABS:   RADIOGRAPHIC STUDIES:  No results found.   PATHOLOGY:  Interpretation Peripheral Blood Flow Cytometry - NO MONOCLONAL B CELL POPULATION OR ABNORMAL T CELL PHENOTYPE IDENTIFIED. Susanne Greenhouse MD Pathologist, Electronic Signature (Case signed 07/03/2015)    ASSESSMENT AND PLAN:  No problem-specific Assessment & Plan notes found for this encounter.   ORDERS PLACED FOR THIS ENCOUNTER: No orders of the defined types were placed in this encounter.   MEDICATIONS PRESCRIBED THIS ENCOUNTER: No orders of the defined types were placed in this encounter.   THERAPY PLAN:  Will continue to follow counts and if needed, will pursue a bone marrow aspiration and biopsy.  All questions were answered. The patient knows to call the clinic with any problems, questions or concerns. We can certainly see the patient much sooner if necessary.  Patient and plan discussed with Dr. Ancil Linsey and she is in agreement with the aforementioned.   This note is electronically signed by: Robynn Pane, PA-C 11/20/2015 1:05 PM

## 2015-11-21 ENCOUNTER — Ambulatory Visit (HOSPITAL_COMMUNITY): Payer: Medicaid Other | Admitting: Hematology & Oncology

## 2015-12-23 ENCOUNTER — Other Ambulatory Visit: Payer: Self-pay | Admitting: Family Medicine

## 2016-01-29 ENCOUNTER — Other Ambulatory Visit: Payer: Self-pay | Admitting: Family

## 2016-01-29 DIAGNOSIS — M545 Low back pain, unspecified: Secondary | ICD-10-CM

## 2016-03-10 ENCOUNTER — Other Ambulatory Visit: Payer: Self-pay | Admitting: Family

## 2016-03-10 DIAGNOSIS — M545 Low back pain, unspecified: Secondary | ICD-10-CM

## 2016-03-19 ENCOUNTER — Ambulatory Visit (INDEPENDENT_AMBULATORY_CARE_PROVIDER_SITE_OTHER): Payer: Medicaid Other | Admitting: Otolaryngology

## 2016-03-19 DIAGNOSIS — R490 Dysphonia: Secondary | ICD-10-CM | POA: Diagnosis not present

## 2016-03-19 DIAGNOSIS — F17210 Nicotine dependence, cigarettes, uncomplicated: Secondary | ICD-10-CM

## 2016-03-19 DIAGNOSIS — J381 Polyp of vocal cord and larynx: Secondary | ICD-10-CM

## 2016-04-13 ENCOUNTER — Ambulatory Visit: Payer: Medicaid Other | Admitting: Family Medicine

## 2016-05-01 ENCOUNTER — Other Ambulatory Visit: Payer: Self-pay | Admitting: Family Medicine

## 2016-05-01 DIAGNOSIS — M545 Low back pain, unspecified: Secondary | ICD-10-CM

## 2016-05-06 ENCOUNTER — Other Ambulatory Visit: Payer: Self-pay | Admitting: Family Medicine

## 2016-06-19 ENCOUNTER — Ambulatory Visit: Payer: Medicaid Other | Admitting: Family Medicine

## 2016-06-22 ENCOUNTER — Encounter: Payer: Self-pay | Admitting: Family

## 2016-07-16 ENCOUNTER — Encounter: Payer: Self-pay | Admitting: Family Medicine

## 2016-07-16 ENCOUNTER — Ambulatory Visit (INDEPENDENT_AMBULATORY_CARE_PROVIDER_SITE_OTHER): Payer: Medicaid Other | Admitting: Family Medicine

## 2016-07-16 VITALS — BP 134/95 | HR 106 | Temp 98.3°F | Ht 63.5 in | Wt 160.0 lb

## 2016-07-16 DIAGNOSIS — R635 Abnormal weight gain: Secondary | ICD-10-CM

## 2016-07-16 DIAGNOSIS — I10 Essential (primary) hypertension: Secondary | ICD-10-CM | POA: Diagnosis not present

## 2016-07-16 DIAGNOSIS — E782 Mixed hyperlipidemia: Secondary | ICD-10-CM | POA: Diagnosis not present

## 2016-07-16 DIAGNOSIS — I152 Hypertension secondary to endocrine disorders: Secondary | ICD-10-CM | POA: Insufficient documentation

## 2016-07-16 DIAGNOSIS — E1159 Type 2 diabetes mellitus with other circulatory complications: Secondary | ICD-10-CM | POA: Insufficient documentation

## 2016-07-16 NOTE — Progress Notes (Signed)
BP (!) 127/92   Pulse (!) 106   Temp 98.3 F (36.8 C) (Oral)   Ht 5' 3.5" (1.613 m)   Wt 160 lb (72.6 kg)   LMP 09/20/2011   BMI 27.90 kg/m    Subjective:    Patient ID: Penny Moreno, female    DOB: Jul 26, 1965, 51 y.o.   MRN: [id number]  HPI: Penny Moreno is a 51 y.o. female presenting on 07/16/2016 for Hypertension (psychiatrist wanted her to be seen, 5 out of 6 last visits BP elevated); Hyperlipidemia; and Weight Gain (recent weight gain, started wellbutrin to help)   HPI Hypertension Patient is currently on Nothing because the new diagnosis, she has been elevated here in office and elevated at her psychiatry's office on multiple occasions recently. her blood pressure today is 127/92. Patient denies any lightheadedness or dizziness. Patient denies headaches, blurred vision, chest pains, shortness of breath, or weakness. Denies any side effects from medication and is content with current medication.  Hyperlipidemia Patient is coming in for recheck of his hyperlipidemia. He is currently taking Lipitor. He denies any issues with myalgias or history of liver damage from it. He denies any focal numbness or weakness or chest pain.    Recent weight gain Patient has had recent weight gain of 13 pounds since September over the past 9 months. She says she does not know of any reason why. She has been more fatigued as well. She does admit that the psychiatric medications that she is on do change her appetite and she does not know if it's only daughter or something else going on. She denies any abdominal complaints or chest pain or shortness of breath or wheezing.  Relevant past medical, surgical, family and social history reviewed and updated as indicated. Interim medical history since our last visit reviewed. Allergies and medications reviewed and updated.  Review of Systems  Constitutional: Positive for fatigue and unexpected weight change. Negative for chills and fever.  HENT:  Negative for congestion, ear discharge and ear pain.   Eyes: Negative for redness and visual disturbance.  Respiratory: Negative for chest tightness and shortness of breath.   Cardiovascular: Negative for chest pain and leg swelling.  Gastrointestinal: Negative for abdominal pain.  Endocrine: Negative for cold intolerance, heat intolerance, polydipsia and polyuria.  Genitourinary: Negative for difficulty urinating and dysuria.  Musculoskeletal: Negative for back pain and gait problem.  Skin: Negative for rash.  Neurological: Negative for dizziness, light-headedness and headaches.  Psychiatric/Behavioral: Negative for agitation and behavioral problems.  All other systems reviewed and are negative.   Per HPI unless specifically indicated above        Objective:    BP (!) 127/92   Pulse (!) 106   Temp 98.3 F (36.8 C) (Oral)   Ht 5' 3.5" (1.613 m)   Wt 160 lb (72.6 kg)   LMP 09/20/2011   BMI 27.90 kg/m   Wt Readings from Last 3 Encounters:  07/16/16 160 lb (72.6 kg)  11/01/15 147 lb 4 oz (66.8 kg)  07/02/15 142 lb (64.4 kg)    Physical Exam  Constitutional: She is oriented to person, place, and time. She appears well-developed and well-nourished. No distress.  Eyes: Conjunctivae are normal.  Neck: Neck supple. No thyromegaly present.  Cardiovascular: Normal rate, regular rhythm, normal heart sounds and intact distal pulses.   No murmur heard. Pulmonary/Chest: Effort normal and breath sounds normal. No respiratory distress. She has no wheezes. She has no rales.  Musculoskeletal: Normal range  of motion.  Lymphadenopathy:    She has no cervical adenopathy.  Neurological: She is alert and oriented to person, place, and time. Coordination normal.  Skin: Skin is warm and dry. No rash noted. She is not diaphoretic.  Psychiatric: She has a normal mood and affect. Her behavior is normal.  Nursing note and vitals reviewed.       Assessment & Plan:   Problem List Items  Addressed This Visit      Cardiovascular and Mediastinum   Hypertension   Relevant Orders   CMP14+EGFR   CBC with Differential/Platelet     Other   Hyperlipidemia - Primary   Relevant Orders   Lipid panel    Other Visit Diagnoses    Weight gain       Relevant Orders   TSH   CBC with Differential/Platelet       Follow up plan: Return in about 4 weeks (around 08/13/2016), or if symptoms worsen or fail to improve, for htn recheck.  Counseling provided for all of the vaccine components Orders Placed This Encounter  Procedures  . TSH  . CMP14+EGFR  . Lipid panel  . CBC with Differential/Platelet    Caryl Pina, MD [facility] 07/16/2016, 3:38 PM

## 2016-07-17 ENCOUNTER — Other Ambulatory Visit: Payer: Self-pay | Admitting: Family Medicine

## 2016-07-17 DIAGNOSIS — M545 Low back pain, unspecified: Secondary | ICD-10-CM

## 2016-07-17 LAB — CBC WITH DIFFERENTIAL/PLATELET
BASOS: 0 %
Basophils Absolute: 0 10*3/uL (ref 0.0–0.2)
EOS (ABSOLUTE): 0.3 10*3/uL (ref 0.0–0.4)
Eos: 2 %
Hematocrit: 44.9 % (ref 34.0–46.6)
Hemoglobin: 15.1 g/dL (ref 11.1–15.9)
Immature Grans (Abs): 0.1 10*3/uL (ref 0.0–0.1)
Immature Granulocytes: 1 %
Lymphocytes Absolute: 3.2 10*3/uL — ABNORMAL HIGH (ref 0.7–3.1)
Lymphs: 24 %
MCH: 31.8 pg (ref 26.6–33.0)
MCHC: 33.6 g/dL (ref 31.5–35.7)
MCV: 95 fL (ref 79–97)
MONOS ABS: 0.5 10*3/uL (ref 0.1–0.9)
Monocytes: 3 %
NEUTROS ABS: 9.6 10*3/uL — AB (ref 1.4–7.0)
Neutrophils: 70 %
Platelets: 381 10*3/uL — ABNORMAL HIGH (ref 150–379)
RBC: 4.75 x10E6/uL (ref 3.77–5.28)
RDW: 13.5 % (ref 12.3–15.4)
WBC: 13.7 10*3/uL — ABNORMAL HIGH (ref 3.4–10.8)

## 2016-07-17 LAB — LIPID PANEL
Chol/HDL Ratio: 7.6 ratio — ABNORMAL HIGH (ref 0.0–4.4)
Cholesterol, Total: 212 mg/dL — ABNORMAL HIGH (ref 100–199)
HDL: 28 mg/dL — ABNORMAL LOW (ref >39)
LDL Calculated: 115 mg/dL — ABNORMAL HIGH (ref 0–99)
Triglycerides: 343 mg/dL — ABNORMAL HIGH (ref 0–149)
VLDL Cholesterol Cal: 69 mg/dL — ABNORMAL HIGH (ref 5–40)

## 2016-07-17 LAB — CMP14+EGFR
A/G RATIO: 2.2 (ref 1.2–2.2)
ALT: 22 IU/L (ref 0–32)
AST: 20 IU/L (ref 0–40)
Albumin: 4.8 g/dL (ref 3.5–5.5)
Alkaline Phosphatase: 108 IU/L (ref 39–117)
BILIRUBIN TOTAL: 0.2 mg/dL (ref 0.0–1.2)
BUN / CREAT RATIO: 11 (ref 9–23)
BUN: 7 mg/dL (ref 6–24)
CALCIUM: 9.6 mg/dL (ref 8.7–10.2)
CHLORIDE: 100 mmol/L (ref 96–106)
CO2: 22 mmol/L (ref 20–29)
Creatinine, Ser: 0.62 mg/dL (ref 0.57–1.00)
GFR, EST AFRICAN AMERICAN: 123 mL/min/{1.73_m2} (ref >59)
GFR, EST NON AFRICAN AMERICAN: 107 mL/min/{1.73_m2} (ref >59)
GLOBULIN, TOTAL: 2.2 g/dL (ref 1.5–4.5)
Glucose: 164 mg/dL — ABNORMAL HIGH (ref 65–99)
POTASSIUM: 4.4 mmol/L (ref 3.5–5.2)
SODIUM: 139 mmol/L (ref 134–144)
TOTAL PROTEIN: 7 g/dL (ref 6.0–8.5)

## 2016-07-17 LAB — TSH: TSH: 0.894 u[IU]/mL (ref 0.450–4.500)

## 2016-07-20 ENCOUNTER — Other Ambulatory Visit: Payer: Self-pay

## 2016-07-20 DIAGNOSIS — R739 Hyperglycemia, unspecified: Secondary | ICD-10-CM

## 2016-07-20 DIAGNOSIS — D72829 Elevated white blood cell count, unspecified: Secondary | ICD-10-CM

## 2016-07-21 ENCOUNTER — Telehealth: Payer: Self-pay | Admitting: Family Medicine

## 2016-07-21 LAB — SPECIMEN STATUS REPORT

## 2016-07-21 LAB — HGB A1C W/O EAG: Hgb A1c MFr Bld: 7.4 % — ABNORMAL HIGH (ref 4.8–5.6)

## 2016-07-21 MED ORDER — FUROSEMIDE 20 MG PO TABS: 20.0000 mg | ORAL_TABLET | Freq: Every day | ORAL | 0 refills | Status: DC | PRN

## 2016-07-21 NOTE — Telephone Encounter (Signed)
Please review and advise.

## 2016-07-22 MED ORDER — METFORMIN HCL 500 MG PO TABS: 500.0000 mg | ORAL_TABLET | Freq: Two times a day (BID) | ORAL | 5 refills | Status: DC

## 2016-07-22 NOTE — Telephone Encounter (Signed)
Pt aware of recommendations

## 2016-07-22 NOTE — Telephone Encounter (Signed)
She should watch for swelling increases in her legs and do daily weights early in the morning and if she is up a few pounds then she can take 1.

## 2016-07-22 NOTE — Telephone Encounter (Signed)
Pt aware lasix sent to pharmacy She would like to know how she should know when to take the medication if not every day

## 2016-08-13 ENCOUNTER — Ambulatory Visit: Payer: Medicaid Other | Admitting: Family Medicine

## 2016-08-21 ENCOUNTER — Encounter (HOSPITAL_COMMUNITY): Payer: Medicaid Other | Attending: Oncology

## 2016-08-28 ENCOUNTER — Other Ambulatory Visit: Payer: Self-pay | Admitting: Family

## 2016-09-03 ENCOUNTER — Ambulatory Visit (INDEPENDENT_AMBULATORY_CARE_PROVIDER_SITE_OTHER): Payer: Medicaid Other | Admitting: Family Medicine

## 2016-09-03 ENCOUNTER — Encounter: Payer: Self-pay | Admitting: Family Medicine

## 2016-09-03 VITALS — BP 137/84 | HR 90 | Temp 98.0°F | Ht 63.5 in | Wt 154.0 lb

## 2016-09-03 DIAGNOSIS — E119 Type 2 diabetes mellitus without complications: Secondary | ICD-10-CM

## 2016-09-03 DIAGNOSIS — I10 Essential (primary) hypertension: Secondary | ICD-10-CM | POA: Diagnosis not present

## 2016-09-03 NOTE — Progress Notes (Signed)
BP 137/84   Pulse 90   Temp 98 F (36.7 C) (Oral)   Ht 5' 3.5" (1.613 m)   Wt 154 lb (69.9 kg)   LMP 09/20/2011   BMI 26.85 kg/m    Subjective:    Patient ID: Penny Moreno, female    DOB: 1966-01-02, 51 y.o.   MRN: [id number]  HPI: Penny Moreno is a 51 y.o. female presenting on 09/03/2016 for Hypertension (1 month followup)   HPI Type 2 diabetes mellitus Patient comes in today for recheck of Her diabetes. Patient has been currently taking metformin, she was just recently diagnosed with new diabetes and her A1c was 7. She says she has been trying to reduce the carbohydrates and change her diet and trying to be more physically active, she says her blood sugars are running between 101 and 160. Patient is not currently on an ACE inhibitor/ARB. Patient has not seen an ophthalmologist this year. Patient denies any issues with their feet.   Hypertension Patient is currently on diet control and monitoring, and their blood pressure today is 137/84, she says when she checks it at home or at work it runs sometimes higher sometimes around that number and sometimes lower. Patient denies any lightheadedness or dizziness. Patient denies headaches, blurred vision, chest pains, shortness of breath, or weakness. Denies any side effects from medication and is content with current medication.   Relevant past medical, surgical, family and social history reviewed and updated as indicated. Interim medical history since our last visit reviewed. Allergies and medications reviewed and updated.  Review of Systems  Constitutional: Negative for chills and fever.  HENT: Negative for congestion, ear discharge and ear pain.   Eyes: Negative for redness and visual disturbance.  Respiratory: Negative for chest tightness and shortness of breath.   Cardiovascular: Negative for chest pain and leg swelling.  Genitourinary: Negative for difficulty urinating and dysuria.  Musculoskeletal: Negative for back pain  and gait problem.  Skin: Negative for rash.  Neurological: Negative for dizziness, light-headedness and headaches.  Psychiatric/Behavioral: Negative for agitation and behavioral problems.  All other systems reviewed and are negative.   Per HPI unless specifically indicated above     Objective:    BP 137/84   Pulse 90   Temp 98 F (36.7 C) (Oral)   Ht 5' 3.5" (1.613 m)   Wt 154 lb (69.9 kg)   LMP 09/20/2011   BMI 26.85 kg/m   Wt Readings from Last 3 Encounters:  09/03/16 154 lb (69.9 kg)  07/16/16 160 lb (72.6 kg)  11/01/15 147 lb 4 oz (66.8 kg)    Physical Exam  Constitutional: She is oriented to person, place, and time. She appears well-developed and well-nourished. No distress.  Eyes: Conjunctivae are normal.  Neck: Neck supple. No thyromegaly present.  Cardiovascular: Normal rate, regular rhythm, normal heart sounds and intact distal pulses.   No murmur heard. Pulmonary/Chest: Effort normal and breath sounds normal. No respiratory distress. She has no wheezes. She has no rales.  Musculoskeletal: Normal range of motion. She exhibits no edema.  Lymphadenopathy:    She has no cervical adenopathy.  Neurological: She is alert and oriented to person, place, and time. Coordination normal.  Skin: Skin is warm and dry. No rash noted. She is not diaphoretic.  Psychiatric: She has a normal mood and affect. Her behavior is normal.  Nursing note and vitals reviewed.     Assessment & Plan:   Problem List Items Addressed This Visit  Cardiovascular and Mediastinum   Hypertension - Primary    Other Visit Diagnoses    Type 2 diabetes mellitus without complication, without long-term current use of insulin ([place])          Continue to monitor blood pressure for now, continue metformin for diabetes and we will recheck in 2 months to see how she is doing.  Follow up plan: Return in about 2 months (around 11/03/2016), or if symptoms worsen or fail to improve, for Diabetes and  hypertension.  Counseling provided for all of the vaccine components No orders of the defined types were placed in this encounter.   Caryl Pina, MD [facility] 09/04/2016, 12:55 PM

## 2016-09-22 ENCOUNTER — Other Ambulatory Visit: Payer: Self-pay | Admitting: Family Medicine

## 2016-10-30 ENCOUNTER — Ambulatory Visit (HOSPITAL_COMMUNITY): Payer: Medicaid Other

## 2016-11-04 ENCOUNTER — Encounter: Payer: Self-pay | Admitting: Family Medicine

## 2016-11-04 ENCOUNTER — Ambulatory Visit (INDEPENDENT_AMBULATORY_CARE_PROVIDER_SITE_OTHER): Payer: Medicaid Other | Admitting: Family Medicine

## 2016-11-04 VITALS — BP 130/79 | HR 81 | Temp 97.2°F | Ht 63.5 in | Wt 147.0 lb

## 2016-11-04 DIAGNOSIS — I10 Essential (primary) hypertension: Secondary | ICD-10-CM

## 2016-11-04 DIAGNOSIS — Z23 Encounter for immunization: Secondary | ICD-10-CM

## 2016-11-04 DIAGNOSIS — J320 Chronic maxillary sinusitis: Secondary | ICD-10-CM

## 2016-11-04 DIAGNOSIS — E1159 Type 2 diabetes mellitus with other circulatory complications: Secondary | ICD-10-CM

## 2016-11-04 DIAGNOSIS — E1140 Type 2 diabetes mellitus with diabetic neuropathy, unspecified: Secondary | ICD-10-CM | POA: Insufficient documentation

## 2016-11-04 DIAGNOSIS — E119 Type 2 diabetes mellitus without complications: Secondary | ICD-10-CM

## 2016-11-04 LAB — BAYER DCA HB A1C WAIVED: HB A1C (BAYER DCA - WAIVED): 6.4 % (ref <7.0)

## 2016-11-04 NOTE — Progress Notes (Signed)
BP 130/79   Pulse 81   Temp (!) 97.2 F (36.2 C) (Oral)   Ht 5' 3.5" (1.613 m)   Wt 147 lb (66.7 kg)   LMP 09/20/2011   BMI 25.63 kg/m    Subjective:    Patient ID: Penny Moreno, female    DOB: [birth year], 51 y.o.   MRN: [id number]  HPI: Penny Moreno is a 51 y.o. female presenting on 11/04/2016 for Diabetes (follow up); Hypertension; and Sinusitis (sinus drainage, causing her to be nauseous)   HPI Type 2 diabetes mellitus Patient comes in today for recheck of his diabetes. Patient has been currently taking metformin. Patient is not currently on an ACE inhibitor/ARB. Patient has not seen an ophthalmologist this year. Patient denies any issues with their feet.   Hypertension Patient is currently on no medication and control dietary, and their blood pressure today is 130/79. Patient denies any lightheadedness or dizziness. Patient denies headaches, blurred vision, chest pains, shortness of breath, or weakness. Denies any side effects from medication and is content with current medication.   Chronic sinus congestion. Patient has chronic sinus congestion and has been using Flonase and allergy medicine and they do not seem to be helping as much. She does admit that she has had chronic allergies and wants was on allergy shots previously but does not want to go do that right now. We discussed that is probably the best options considering we are having a lot of medications to help. She has been having issues this time for at least a couple weeks.  Relevant past medical, surgical, family and social history reviewed and updated as indicated. Interim medical history since our last visit reviewed. Allergies and medications reviewed and updated.  Review of Systems  Constitutional: Negative for chills and fever.  HENT: Positive for congestion, postnasal drip, rhinorrhea, sinus pressure, sneezing and sore throat. Negative for ear discharge and ear pain.   Eyes: Negative for pain, redness  and visual disturbance.  Respiratory: Negative for chest tightness and shortness of breath.   Cardiovascular: Negative for chest pain and leg swelling.  Genitourinary: Negative for difficulty urinating and dysuria.  Musculoskeletal: Negative for back pain and gait problem.  Skin: Negative for rash.  Neurological: Negative for dizziness, weakness, light-headedness, numbness and headaches.  Psychiatric/Behavioral: Negative for agitation and behavioral problems.  All other systems reviewed and are negative.   Per HPI unless specifically indicated above        Objective:    BP 130/79   Pulse 81   Temp (!) 97.2 F (36.2 C) (Oral)   Ht 5' 3.5" (1.613 m)   Wt 147 lb (66.7 kg)   LMP 09/20/2011   BMI 25.63 kg/m   Wt Readings from Last 3 Encounters:  11/04/16 147 lb (66.7 kg)  09/03/16 154 lb (69.9 kg)  07/16/16 160 lb (72.6 kg)    Physical Exam  Constitutional: She is oriented to person, place, and time. She appears well-developed and well-nourished. No distress.  HENT:  Right Ear: Tympanic membrane, external ear and ear canal normal.  Left Ear: Tympanic membrane, external ear and ear canal normal.  Nose: Mucosal edema and rhinorrhea present. No epistaxis. Right sinus exhibits no maxillary sinus tenderness and no frontal sinus tenderness. Left sinus exhibits no maxillary sinus tenderness and no frontal sinus tenderness.  Mouth/Throat: Uvula is midline and mucous membranes are normal. Posterior oropharyngeal edema present. No oropharyngeal exudate, posterior oropharyngeal erythema or tonsillar abscesses.  Eyes: Conjunctivae and EOM are normal.  Cardiovascular: Normal rate, regular rhythm, normal heart sounds and intact distal pulses.   No murmur heard. Pulmonary/Chest: Effort normal and breath sounds normal. No respiratory distress. She has no wheezes.  Musculoskeletal: Normal range of motion. She exhibits no edema or tenderness.  Neurological: She is alert and oriented to person,  place, and time. Coordination normal.  Skin: Skin is warm and dry. No rash noted. She is not diaphoretic.  Psychiatric: She has a normal mood and affect. Her behavior is normal.  Nursing note and vitals reviewed.   Results for orders placed or performed in visit on 07/16/16  TSH  Result Value Ref Range   TSH 0.894 0.450 - 4.500 uIU/mL  CMP14+EGFR  Result Value Ref Range   Glucose 164 (H) 65 - 99 mg/dL   BUN 7 6 - 24 mg/dL   Creatinine, Ser 0.62 0.57 - 1.00 mg/dL   GFR calc non Af Amer 107 >59 mL/min/1.73   GFR calc Af Amer 123 >59 mL/min/1.73   BUN/Creatinine Ratio 11 9 - 23   Sodium 139 134 - 144 mmol/L   Potassium 4.4 3.5 - 5.2 mmol/L   Chloride 100 96 - 106 mmol/L   CO2 22 20 - 29 mmol/L   Calcium 9.6 8.7 - 10.2 mg/dL   Total Protein 7.0 6.0 - 8.5 g/dL   Albumin 4.8 3.5 - 5.5 g/dL   Globulin, Total 2.2 1.5 - 4.5 g/dL   Albumin/Globulin Ratio 2.2 1.2 - 2.2   Bilirubin Total 0.2 0.0 - 1.2 mg/dL   Alkaline Phosphatase 108 39 - 117 IU/L   AST 20 0 - 40 IU/L   ALT 22 0 - 32 IU/L  Lipid panel  Result Value Ref Range   Cholesterol, Total 212 (H) 100 - 199 mg/dL   Triglycerides 343 (H) 0 - 149 mg/dL   HDL 28 (L) >39 mg/dL   VLDL Cholesterol Cal 69 (H) 5 - 40 mg/dL   LDL Calculated 115 (H) 0 - 99 mg/dL   Chol/HDL Ratio 7.6 (H) 0.0 - 4.4 ratio  CBC with Differential/Platelet  Result Value Ref Range   WBC 13.7 (H) 3.4 - 10.8 x10E3/uL   RBC 4.75 3.77 - 5.28 x10E6/uL   Hemoglobin 15.1 11.1 - 15.9 g/dL   Hematocrit 44.9 34.0 - 46.6 %   MCV 95 79 - 97 fL   MCH 31.8 26.6 - 33.0 pg   MCHC 33.6 31.5 - 35.7 g/dL   RDW 13.5 12.3 - 15.4 %   Platelets 381 (H) 150 - 379 x10E3/uL   Neutrophils 70 Not Estab. %   Lymphs 24 Not Estab. %   Monocytes 3 Not Estab. %   Eos 2 Not Estab. %   Basos 0 Not Estab. %   Neutrophils Absolute 9.6 (H) 1.4 - 7.0 x10E3/uL   Lymphocytes Absolute 3.2 (H) 0.7 - 3.1 x10E3/uL   Monocytes Absolute 0.5 0.1 - 0.9 x10E3/uL   EOS (ABSOLUTE) 0.3 0.0 - 0.4  x10E3/uL   Basophils Absolute 0.0 0.0 - 0.2 x10E3/uL   Immature Granulocytes 1 Not Estab. %   Immature Grans (Abs) 0.1 0.0 - 0.1 x10E3/uL  Hgb A1c w/o eAG  Result Value Ref Range   Hgb A1c MFr Bld 7.4 (H) 4.8 - 5.6 %  Specimen status report  Result Value Ref Range   specimen status report Comment       Assessment & Plan:   Problem List Items Addressed This Visit      Cardiovascular and Mediastinum   Hypertension  associated with diabetes ([place])     Endocrine   Type 2 diabetes mellitus without complications ([place])   Relevant Orders   Bayer DCA Hb A1c Waived   CMP14+EGFR    Other Visit Diagnoses    Need for immunization against influenza    -  Primary   Relevant Orders   Flu Vaccine QUAD 36+ mos IM (Completed)   Chronic maxillary sinusitis           Follow up plan: Return in about 3 months (around 02/04/2017), or if symptoms worsen or fail to improve, for type 2 diabetes recheck.  Counseling provided for all of the vaccine components Orders Placed This Encounter  Procedures  . Pneumococcal polysaccharide vaccine 23-valent greater than or equal to 2yo subcutaneous/IM  . Flu Vaccine QUAD 36+ mos IM    Caryl Pina, MD [facility] 11/04/2016, 1:50 PM

## 2016-11-05 LAB — CMP14+EGFR
A/G RATIO: 1.8 (ref 1.2–2.2)
ALBUMIN: 4.6 g/dL (ref 3.5–5.5)
ALT: 14 IU/L (ref 0–32)
AST: 16 IU/L (ref 0–40)
Alkaline Phosphatase: 95 IU/L (ref 39–117)
BILIRUBIN TOTAL: 0.3 mg/dL (ref 0.0–1.2)
BUN / CREAT RATIO: 15 (ref 9–23)
BUN: 10 mg/dL (ref 6–24)
CALCIUM: 9.7 mg/dL (ref 8.7–10.2)
CHLORIDE: 102 mmol/L (ref 96–106)
CO2: 23 mmol/L (ref 20–29)
Creatinine, Ser: 0.66 mg/dL (ref 0.57–1.00)
GFR, EST AFRICAN AMERICAN: 120 mL/min/{1.73_m2} (ref >59)
GFR, EST NON AFRICAN AMERICAN: 104 mL/min/{1.73_m2} (ref >59)
GLUCOSE: 120 mg/dL — AB (ref 65–99)
Globulin, Total: 2.6 g/dL (ref 1.5–4.5)
Potassium: 4 mmol/L (ref 3.5–5.2)
Sodium: 142 mmol/L (ref 134–144)
TOTAL PROTEIN: 7.2 g/dL (ref 6.0–8.5)

## 2016-11-25 ENCOUNTER — Encounter (HOSPITAL_COMMUNITY): Payer: Medicaid Other

## 2016-11-25 ENCOUNTER — Encounter (HOSPITAL_COMMUNITY): Payer: Self-pay | Admitting: Oncology

## 2016-11-25 ENCOUNTER — Encounter (HOSPITAL_COMMUNITY): Payer: Medicaid Other | Attending: Oncology | Admitting: Oncology

## 2016-11-25 VITALS — BP 110/61 | HR 104 | Temp 97.7°F | Resp 18 | Ht 63.5 in | Wt 149.5 lb

## 2016-11-25 DIAGNOSIS — D72829 Elevated white blood cell count, unspecified: Secondary | ICD-10-CM | POA: Diagnosis present

## 2016-11-25 DIAGNOSIS — M25511 Pain in right shoulder: Secondary | ICD-10-CM | POA: Diagnosis not present

## 2016-11-25 DIAGNOSIS — Z720 Tobacco use: Secondary | ICD-10-CM

## 2016-11-25 DIAGNOSIS — E119 Type 2 diabetes mellitus without complications: Secondary | ICD-10-CM

## 2016-11-25 DIAGNOSIS — G8929 Other chronic pain: Secondary | ICD-10-CM | POA: Diagnosis not present

## 2016-11-25 DIAGNOSIS — F329 Major depressive disorder, single episode, unspecified: Secondary | ICD-10-CM

## 2016-11-25 DIAGNOSIS — M545 Low back pain: Secondary | ICD-10-CM | POA: Diagnosis not present

## 2016-11-25 DIAGNOSIS — I10 Essential (primary) hypertension: Secondary | ICD-10-CM | POA: Diagnosis not present

## 2016-11-25 LAB — CBC WITH DIFFERENTIAL/PLATELET
BASOS ABS: 0 10*3/uL (ref 0.0–0.1)
BASOS PCT: 0 %
Eosinophils Absolute: 0.3 10*3/uL (ref 0.0–0.7)
Eosinophils Relative: 3 %
HEMATOCRIT: 40.2 % (ref 36.0–46.0)
Hemoglobin: 13.4 g/dL (ref 12.0–15.0)
LYMPHS PCT: 32 %
Lymphs Abs: 3.1 10*3/uL (ref 0.7–4.0)
MCH: 31.8 pg (ref 26.0–34.0)
MCHC: 33.3 g/dL (ref 30.0–36.0)
MCV: 95.5 fL (ref 78.0–100.0)
MONO ABS: 0.4 10*3/uL (ref 0.1–1.0)
Monocytes Relative: 4 %
NEUTROS ABS: 6.1 10*3/uL (ref 1.7–7.7)
NEUTROS PCT: 61 %
Platelets: 340 10*3/uL (ref 150–400)
RBC: 4.21 MIL/uL (ref 3.87–5.11)
RDW: 13 % (ref 11.5–15.5)
WBC: 10 10*3/uL (ref 4.0–10.5)

## 2016-11-25 NOTE — Progress Notes (Signed)
[facility] Initial Visit:  Patient Care Team: Dettinger, Fransisca Kaufmann, MD as PCP - General (Family Medicine)  CHIEF COMPLAINTS/PURPOSE OF CONSULTATION:  Leukocytosis  HISTORY OF PRESENTING ILLNESS: Penny Moreno 51 y.o. female presents today for evaluation of leukocytosis.  Review of her lab work in our system reveals that she has had persistent leukocytosis since at least 2013. Review of her blood work reveals that her WBC has ranged between 12.8k to 14.3 K.  Most recently CBC performed on 07/16/2016 demonstrates WBC 13.7 K, hemoglobin 15.1 g/dL, hematocrit 44.9%, platelet count 381K, differential demonstrates an absolute neutrophil count of 9.6K, absolute lymphocyte count of 3.2K.    Patient had full leukocytosis workup performed on 07/02/2015.  Peripheral blood flow cytometry was negative for monoclonal B-cell population or abnormal T-cell phenotype identified. BCR-ABL gene mutation was negative for CML. JAK2 with reflex to exon12/MPL/CALR was negative for myeloproliferative disorders.  Patient has been lost to follow up since June 2017. She states that since her last visit she was diagnosed with hypertension and type II diabetes. She has been placed on metformin which has been causing her to have nausea/vomiting, and diarrhea. She denies any recent infections. Denies any chest pain, shortness of breath, abdominal pain, dysuria, dizziness, headaches, focal weakness. Patient has been feeling fatigued. She has chronic pain in her back and right shoulder. She also has been suffering from depression. She continues to smoke 1/2 ppd.  Review of Systems - Oncology ROS as per HPI otherwise 12 point ROS is negative.  MEDICAL HISTORY: Past Medical History:  Diagnosis Date  . Anxiety   . Arthritis   . Back pain   . Bipolar disorder ([place])   . Depression   . Eating disorder   . Frozen shoulder   . GERD (gastroesophageal reflux disease)   . Leukocytosis 07/02/2015  .  Neuromuscular disorder ([facility])    Fibromyalgia  . OCD (obsessive compulsive disorder)   . Panic disorder   . Vaginal Pap smear, abnormal   . Vocal cord polyps     SURGICAL HISTORY: Past Surgical History:  Procedure Laterality Date  . DIAGNOSTIC LAPAROSCOPY     [place]  . LAPAROSCOPIC TUBAL LIGATION  10/30/2011   Procedure: LAPAROSCOPIC TUBAL LIGATION;  Surgeon: Jonnie Kind, MD;  Location: [facility];  Service: Gynecology;  Laterality: Bilateral;  Laparoscopic Bilateral tubal ligation with falope rings  . TONSILLECTOMY    . TUBAL LIGATION  10/04/2011    SOCIAL HISTORY: Social History   Social History  . Marital status: Legally Separated    Spouse name: N/A  . Number of children: N/A  . Years of education: N/A   Occupational History  . Not on file.   Social History Main Topics  . Smoking status: Current Every Day Smoker    Packs/day: 0.50    Years: 30.00    Types: Cigarettes  . Smokeless tobacco: Never Used  . Alcohol use No  . Drug use: No  . Sexual activity: Yes    Birth control/ protection: None, Post-menopausal   Other Topics Concern  . Not on file   Social History Narrative  . No narrative on file    FAMILY HISTORY Family History  Problem Relation Age of Onset  . Hyperlipidemia Mother   . Hypertension Mother   . Hyperlipidemia Father   . Bipolar disorder Sister   . Cancer Maternal Grandmother        breast cancer  . Heart disease Maternal Grandfather 52  ALLERGIES:  is allergic to tetracyclines & related; latex; and sulfa antibiotics.  MEDICATIONS:  Current Outpatient Prescriptions  Medication Sig Dispense Refill  . ALPRAZolam (XANAX) 0.5 MG tablet Take 1 tablet (0.5 mg total) by mouth daily as needed. For anxiety (Patient taking differently: Take 0.5 mg by mouth 2 (two) times daily as needed. For anxiety) 30 tablet 0  . Ascorbic Acid (VITAMIN C) 100 MG tablet Take 100 mg by mouth daily. Reported on 05/21/2015    . atorvastatin (LIPITOR) 40 MG  tablet TAKE 1 TABLET EVERY EVENING 90 tablet 1  . BETA CAROTENE PO Take by mouth daily.    Marland Kitchen BIOTIN PO Take by mouth daily.    Marland Kitchen buPROPion (WELLBUTRIN XL) 150 MG 24 hr tablet Take 150 mg by mouth daily.    . Cholecalciferol (VITAMIN D3) 5000 UNITS TABS Take by mouth. Two tablets per day    . cyclobenzaprine (FLEXERIL) 10 MG tablet TAKE 1 TABLET (10 MG TOTAL) BY MOUTH 3 (THREE) TIMES DAILY AS NEEDED FOR MUSCLE SPASMS. 90 tablet 0  . fenofibrate (TRICOR) 145 MG tablet TAKE 1 TABLET (145 MG TOTAL) BY MOUTH DAILY. 90 tablet 1  . FLUoxetine (PROZAC) 20 MG tablet Take 20 mg by mouth daily.    . fluticasone (FLONASE) 50 MCG/ACT nasal spray PLACE 1 SPRAY INTO BOTH NOSTRILS 2 (TWO) TIMES DAILY AS NEEDED FOR ALLERGIES OR RHINITIS. 16 g 5  . furosemide (LASIX) 20 MG tablet TAKE 1 TABLET (20 MG TOTAL) BY MOUTH DAILY AS NEEDED. 30 tablet 3  . gabapentin (NEURONTIN) 300 MG capsule Take 1 capsule (300 mg total) by mouth at bedtime. 90 capsule 3  . ibuprofen (ADVIL,MOTRIN) 800 MG tablet Take 1 tablet (800 mg total) by mouth every 6 (six) hours as needed. 30 tablet 3  . lurasidone (LATUDA) 40 MG TABS tablet Take 40 mg by mouth daily with breakfast. 1/2 to 1 tablet tablet    . metFORMIN (GLUCOPHAGE) 500 MG tablet Take 1 tablet (500 mg total) by mouth 2 (two) times daily with a meal. 60 tablet 5  . omeprazole (PRILOSEC) 20 MG capsule Take 2 capsules (40 mg total) by mouth daily. 180 capsule 3  . Probiotic Product (PROBIOTIC PEARLS PO) Take 1 capsule by mouth daily.    . Turmeric 500 MG CAPS Take 500 mg by mouth daily.    . vitamin E 100 UNIT capsule Take 100 Units by mouth daily.    . Wheat Dextrin (BENEFIBER) POWD Take 2 each by mouth daily. 350 g 11   No current facility-administered medications for this visit.     PHYSICAL EXAMINATION:  Vitals:   11/25/16 1324  BP: 110/61  Pulse: (!) 104  Resp: 18  Temp: 97.7 F (36.5 C)  SpO2: 98%    Filed Weights   11/25/16 1324  Weight: 149 lb 8 oz (67.8 kg)      Physical Exam  Constitutional: Well-developed, well-nourished, and in no distress.   HENT:  Head: Normocephalic and atraumatic.  Mouth/Throat: No oropharyngeal exudate. Mucosa moist. Eyes: Pupils are equal, round, and reactive to light. Conjunctivae are normal. No scleral icterus.  Neck: Normal range of motion. Neck supple. No JVD present.  Cardiovascular: Normal rate, regular rhythm and normal heart sounds.  Exam reveals no gallop and no friction rub.   No murmur heard. Pulmonary/Chest: Effort normal and breath sounds normal. No respiratory distress. No wheezes.No rales.  Abdominal: Soft. Bowel sounds are normal. No distension. There is no tenderness. There is no guarding.  Musculoskeletal: No edema or tenderness.  Lymphadenopathy:    No cervical or supraclavicular adenopathy.  Neurological: Alert and oriented to person, place, and time. No cranial nerve deficit.  Skin: Skin is warm and dry. No rash noted. No erythema. No pallor.  Psychiatric: Affect and judgment normal.    LABORATORY DATA: I have personally reviewed the data as listed:  Office Visit on 11/04/2016  Component Date Value Ref Range Status  . Bayer DCA Hb A1c Waived 11/04/2016 6.4  <7.0 % Final   Comment:                                       Diabetic Adult            <7.0                                       Healthy Adult        4.3 - 5.7                                                           (DCCT/NGSP) American Diabetes Association's Summary of Glycemic Recommendations for Adults with Diabetes: Hemoglobin A1c <7.0%. More stringent glycemic goals (A1c <6.0%) may further reduce complications at the cost of increased risk of hypoglycemia.   . Glucose 11/04/2016 120* 65 - 99 mg/dL Final  . BUN 11/04/2016 10  6 - 24 mg/dL Final  . Creatinine, Ser 11/04/2016 0.66  0.57 - 1.00 mg/dL Final  . GFR calc non Af Amer 11/04/2016 104  >59 mL/min/1.73 Final  . GFR calc Af Amer 11/04/2016 120  >59 mL/min/1.73 Final   . BUN/Creatinine Ratio 11/04/2016 15  9 - 23 Final  . Sodium 11/04/2016 142  134 - 144 mmol/L Final  . Potassium 11/04/2016 4.0  3.5 - 5.2 mmol/L Final  . Chloride 11/04/2016 102  96 - 106 mmol/L Final  . CO2 11/04/2016 23  20 - 29 mmol/L Final  . Calcium 11/04/2016 9.7  8.7 - 10.2 mg/dL Final  . Total Protein 11/04/2016 7.2  6.0 - 8.5 g/dL Final  . Albumin 11/04/2016 4.6  3.5 - 5.5 g/dL Final  . Globulin, Total 11/04/2016 2.6  1.5 - 4.5 g/dL Final  . Albumin/Globulin Ratio 11/04/2016 1.8  1.2 - 2.2 Final  . Bilirubin Total 11/04/2016 0.3  0.0 - 1.2 mg/dL Final  . Alkaline Phosphatase 11/04/2016 95  39 - 117 IU/L Final  . AST 11/04/2016 16  0 - 40 IU/L Final  . ALT 11/04/2016 14  0 - 32 IU/L Final    RADIOGRAPHIC STUDIES: I have personally reviewed the radiological images as listed and agree with the findings in the report  No results found.  ASSESSMENT/PLAN Leukocytosis-stable. Patient had full leukocytosis workup performed on 07/02/2015.  Peripheral blood flow cytometry was negative for monoclonal B-cell population or abnormal T-cell phenotype identified. BCR-ABL gene mutation was negative for CML. JAK2 with reflex to exon12/MPL/CALR was negative for myeloproliferative disorders.  PLAN: Patient's leukocytosis has been stable. Mild leukocytosis may be secondary to her underlying chronic smoking.  Continue to monitor her CBC for stability. If she should have  a progressive rise in her leukocytosis in the future, then will pursue bone marrow biopsy. CBC today. RTC in 6 months for follow up with CBC.  All questions were answered. The patient knows to call the clinic with any problems, questions or concerns.  This note was electronically signed.    Twana First, MD  11/25/2016 1:14 PM

## 2017-02-01 ENCOUNTER — Other Ambulatory Visit: Payer: Self-pay | Admitting: Family Medicine

## 2017-04-02 ENCOUNTER — Encounter: Payer: Self-pay | Admitting: Family Medicine

## 2017-04-02 ENCOUNTER — Ambulatory Visit: Payer: Medicaid Other | Admitting: Family Medicine

## 2017-04-02 VITALS — BP 132/76 | HR 104 | Temp 98.0°F | Ht 63.5 in | Wt 145.0 lb

## 2017-04-02 DIAGNOSIS — E119 Type 2 diabetes mellitus without complications: Secondary | ICD-10-CM

## 2017-04-02 DIAGNOSIS — I10 Essential (primary) hypertension: Secondary | ICD-10-CM

## 2017-04-02 DIAGNOSIS — E785 Hyperlipidemia, unspecified: Secondary | ICD-10-CM

## 2017-04-02 DIAGNOSIS — E1169 Type 2 diabetes mellitus with other specified complication: Secondary | ICD-10-CM

## 2017-04-02 DIAGNOSIS — M8580 Other specified disorders of bone density and structure, unspecified site: Secondary | ICD-10-CM

## 2017-04-02 DIAGNOSIS — E1159 Type 2 diabetes mellitus with other circulatory complications: Secondary | ICD-10-CM | POA: Diagnosis not present

## 2017-04-02 LAB — BAYER DCA HB A1C WAIVED: HB A1C: 6.1 % (ref <7.0)

## 2017-04-02 NOTE — Progress Notes (Signed)
BP 132/76   Pulse (!) 104   Temp 98 F (36.7 C) (Oral)   Ht 5' 3.5" (1.613 m)   Wt 145 lb (65.8 kg)   LMP 09/20/2011   BMI 25.28 kg/m    Subjective:    Patient ID: Penny Moreno, female    DOB: 02/13/1965, 52 y.o.   MRN: [id number]  HPI: Penny Moreno is a 52 y.o. female presenting on 04/02/2017 for Hyperlipidemia; Hypertension; Diabetes; and Fatigue (would like to have Vitamin D checked)   HPI Hyperlipidemia Patient is coming in for recheck of his hyperlipidemia. The patient is currently taking Lipitor and fenofibrate. They deny any issues with myalgias or history of liver damage from it. They deny any focal numbness or weakness or chest pain.   Type 2 diabetes mellitus Patient comes in today for recheck of his diabetes. Patient has been currently taking metformin. Patient is not currently on an ACE inhibitor/ARB. Patient has not seen an ophthalmologist this year. Patient denies any new issues with their feet, patient has known neuropathy and takes gabapentin.  Patient does have some nausea with the metformin, hopefully with her weight loss she can come off of it.  Hypertension Patient is currently on no medication currently, is diet controlled, and their blood pressure today is 132/76. Patient denies any lightheadedness or dizziness. Patient denies headaches, blurred vision, chest pains, shortness of breath, or weakness. Denies any side effects from medication and is content with current medication.   Osteopenia/vitamin D recheck  Relevant past medical, surgical, family and social history reviewed and updated as indicated. Interim medical history since our last visit reviewed. Allergies and medications reviewed and updated.  Review of Systems  Constitutional: Negative for chills and fever.  Eyes: Negative for visual disturbance.  Respiratory: Negative for chest tightness and shortness of breath.   Cardiovascular: Negative for chest pain and leg swelling.    Gastrointestinal: Positive for nausea. Negative for abdominal pain.  Genitourinary: Negative for difficulty urinating and dysuria.  Musculoskeletal: Negative for back pain and gait problem.  Skin: Negative for rash.  Neurological: Negative for light-headedness and headaches.  Psychiatric/Behavioral: Negative for agitation and behavioral problems.  All other systems reviewed and are negative.   Per HPI unless specifically indicated above   Allergies as of 04/02/2017      Reactions   Tetracyclines & Related Nausea And Vomiting   Latex    Irritates skin    Sulfa Antibiotics Nausea And Vomiting      Medication List        Accurate as of 04/02/17  9:35 AM. Always use your most recent med list.          ALPRAZolam 0.5 MG tablet Commonly known as:  XANAX Take 1 tablet (0.5 mg total) by mouth daily as needed. For anxiety   atorvastatin 40 MG tablet Commonly known as:  LIPITOR TAKE 1 TABLET BY MOUTH EVERY DAY IN THE EVENING   buPROPion 150 MG 24 hr tablet Commonly known as:  WELLBUTRIN XL Take 150 mg by mouth daily.   cholecalciferol 1000 units tablet Commonly known as:  VITAMIN D Take 1,000 Units by mouth daily.   cyclobenzaprine 10 MG tablet Commonly known as:  FLEXERIL TAKE 1 TABLET (10 MG TOTAL) BY MOUTH 3 (THREE) TIMES DAILY AS NEEDED FOR MUSCLE SPASMS.   fenofibrate 145 MG tablet Commonly known as:  TRICOR TAKE 1 TABLET (145 MG TOTAL) BY MOUTH DAILY.   FLUoxetine 20 MG tablet Commonly known as:  PROZAC Take 20 mg by mouth daily.   fluticasone 50 MCG/ACT nasal spray Commonly known as:  FLONASE PLACE 1 SPRAY INTO BOTH NOSTRILS 2 (TWO) TIMES DAILY AS NEEDED FOR ALLERGIES OR RHINITIS.   furosemide 20 MG tablet Commonly known as:  LASIX TAKE 1 TABLET (20 MG TOTAL) BY MOUTH DAILY AS NEEDED.   gabapentin 300 MG capsule Commonly known as:  NEURONTIN Take 1 capsule (300 mg total) by mouth at bedtime.   ibuprofen 800 MG tablet Commonly known as:   ADVIL,MOTRIN Take 1 tablet (800 mg total) by mouth every 6 (six) hours as needed.   lurasidone 40 MG Tabs tablet Commonly known as:  LATUDA Take 40 mg by mouth daily with breakfast. 1/2 to 1 tablet tablet   metFORMIN 500 MG tablet Commonly known as:  GLUCOPHAGE Take 1 tablet (500 mg total) by mouth 2 (two) times daily with a meal.   omeprazole 20 MG capsule Commonly known as:  PRILOSEC Take 2 capsules (40 mg total) by mouth daily.   PROBIOTIC PEARLS PO Take 1 capsule by mouth daily.   Turmeric 500 MG Caps Take 500 mg by mouth daily.          Objective:    BP 132/76   Pulse (!) 104   Temp 98 F (36.7 C) (Oral)   Ht 5' 3.5" (1.613 m)   Wt 145 lb (65.8 kg)   LMP 09/20/2011   BMI 25.28 kg/m   Wt Readings from Last 3 Encounters:  04/02/17 145 lb (65.8 kg)  11/25/16 149 lb 8 oz (67.8 kg)  11/04/16 147 lb (66.7 kg)    Physical Exam  Constitutional: She is oriented to person, place, and time. She appears well-developed and well-nourished. No distress.  Eyes: Conjunctivae are normal.  Neck: Neck supple. No thyromegaly present.  Cardiovascular: Normal rate, regular rhythm, normal heart sounds and intact distal pulses.  No murmur heard. Pulmonary/Chest: Effort normal and breath sounds normal. No respiratory distress. She has no wheezes. She has no rales.  Abdominal: Soft. Bowel sounds are normal. There is no tenderness.  Musculoskeletal: Normal range of motion. She exhibits no edema.  Lymphadenopathy:    She has no cervical adenopathy.  Neurological: She is alert and oriented to person, place, and time. Coordination normal.  Skin: Skin is warm and dry. No rash noted. She is not diaphoretic.  Psychiatric: She has a normal mood and affect. Her behavior is normal.  Nursing note and vitals reviewed.       Assessment & Plan:   Problem List Items Addressed This Visit      Cardiovascular and Mediastinum   Hypertension associated with diabetes ([place])   Relevant Orders    CMP14+EGFR   TSH     Endocrine   Hyperlipidemia associated with type 2 diabetes mellitus ([place])   Relevant Orders   Lipid panel   Type 2 diabetes mellitus without complications ([place]) - Primary   Relevant Orders   Microalbumin / creatinine urine ratio   Bayer DCA Hb A1c Waived   CMP14+EGFR   CBC with Differential/Platelet   TSH     Musculoskeletal and Integument   Osteopenia   Relevant Orders   CBC with Differential/Platelet   VITAMIN D 25 Hydroxy (Vit-D Deficiency, Fractures)       Follow up plan: Return in about 3 months (around 07/03/2017), or if symptoms worsen or fail to improve, for Diabetes recheck.  Counseling provided for all of the vaccine components Orders Placed This Encounter  Procedures  .  Microalbumin / creatinine urine ratio  . Bayer DCA Hb A1c Waived  . CMP14+EGFR  . CBC with Differential/Platelet  . Lipid panel  . VITAMIN D 25 Hydroxy (Vit-D Deficiency, Fractures)  . TSH    Caryl Pina, MD [facility] 04/02/2017, 9:35 AM

## 2017-04-03 LAB — CBC WITH DIFFERENTIAL/PLATELET
BASOS ABS: 0.1 10*3/uL (ref 0.0–0.2)
Basos: 0 %
EOS (ABSOLUTE): 0.2 10*3/uL (ref 0.0–0.4)
Eos: 2 %
HEMOGLOBIN: 14.5 g/dL (ref 11.1–15.9)
Hematocrit: 42.3 % (ref 34.0–46.6)
IMMATURE GRANS (ABS): 0 10*3/uL (ref 0.0–0.1)
IMMATURE GRANULOCYTES: 0 %
LYMPHS: 22 %
Lymphocytes Absolute: 2.6 10*3/uL (ref 0.7–3.1)
MCH: 31.4 pg (ref 26.6–33.0)
MCHC: 34.3 g/dL (ref 31.5–35.7)
MCV: 92 fL (ref 79–97)
Monocytes Absolute: 0.6 10*3/uL (ref 0.1–0.9)
Monocytes: 5 %
NEUTROS ABS: 8.7 10*3/uL — AB (ref 1.4–7.0)
NEUTROS PCT: 71 %
PLATELETS: 458 10*3/uL — AB (ref 150–379)
RBC: 4.62 x10E6/uL (ref 3.77–5.28)
RDW: 13.3 % (ref 12.3–15.4)
WBC: 12.1 10*3/uL — AB (ref 3.4–10.8)

## 2017-04-03 LAB — CMP14+EGFR
ALT: 23 IU/L (ref 0–32)
AST: 24 IU/L (ref 0–40)
Albumin/Globulin Ratio: 1.9 (ref 1.2–2.2)
Albumin: 4.8 g/dL (ref 3.5–5.5)
Alkaline Phosphatase: 84 IU/L (ref 39–117)
BILIRUBIN TOTAL: 0.2 mg/dL (ref 0.0–1.2)
BUN/Creatinine Ratio: 10 (ref 9–23)
BUN: 7 mg/dL (ref 6–24)
CALCIUM: 9.6 mg/dL (ref 8.7–10.2)
CHLORIDE: 103 mmol/L (ref 96–106)
CO2: 21 mmol/L (ref 20–29)
Creatinine, Ser: 0.71 mg/dL (ref 0.57–1.00)
GFR calc non Af Amer: 100 mL/min/{1.73_m2} (ref >59)
GFR, EST AFRICAN AMERICAN: 116 mL/min/{1.73_m2} (ref >59)
GLUCOSE: 145 mg/dL — AB (ref 65–99)
Globulin, Total: 2.5 g/dL (ref 1.5–4.5)
Potassium: 4.4 mmol/L (ref 3.5–5.2)
Sodium: 142 mmol/L (ref 134–144)
TOTAL PROTEIN: 7.3 g/dL (ref 6.0–8.5)

## 2017-04-03 LAB — LIPID PANEL
CHOLESTEROL TOTAL: 214 mg/dL — AB (ref 100–199)
Chol/HDL Ratio: 7.4 ratio — ABNORMAL HIGH (ref 0.0–4.4)
HDL: 29 mg/dL — AB (ref >39)
LDL CALC: 123 mg/dL — AB (ref 0–99)
TRIGLYCERIDES: 311 mg/dL — AB (ref 0–149)
VLDL CHOLESTEROL CAL: 62 mg/dL — AB (ref 5–40)

## 2017-04-03 LAB — TSH: TSH: 0.954 u[IU]/mL (ref 0.450–4.500)

## 2017-04-03 LAB — VITAMIN D 25 HYDROXY (VIT D DEFICIENCY, FRACTURES): VIT D 25 HYDROXY: 25.2 ng/mL — AB (ref 30.0–100.0)

## 2017-04-08 ENCOUNTER — Ambulatory Visit (INDEPENDENT_AMBULATORY_CARE_PROVIDER_SITE_OTHER): Payer: Medicaid Other | Admitting: Otolaryngology

## 2017-04-08 DIAGNOSIS — F17210 Nicotine dependence, cigarettes, uncomplicated: Secondary | ICD-10-CM | POA: Diagnosis not present

## 2017-04-08 DIAGNOSIS — R490 Dysphonia: Secondary | ICD-10-CM

## 2017-04-12 ENCOUNTER — Other Ambulatory Visit: Payer: Self-pay | Admitting: *Deleted

## 2017-04-12 DIAGNOSIS — K219 Gastro-esophageal reflux disease without esophagitis: Secondary | ICD-10-CM

## 2017-04-16 MED ORDER — OMEPRAZOLE 20 MG PO CPDR: 40.0000 mg | DELAYED_RELEASE_CAPSULE | Freq: Every day | ORAL | 1 refills | Status: DC

## 2017-04-26 ENCOUNTER — Other Ambulatory Visit: Payer: Medicaid Other | Admitting: Obstetrics and Gynecology

## 2017-05-26 ENCOUNTER — Ambulatory Visit (HOSPITAL_COMMUNITY): Payer: Medicaid Other | Admitting: Internal Medicine

## 2017-05-26 ENCOUNTER — Other Ambulatory Visit (HOSPITAL_COMMUNITY): Payer: Medicaid Other

## 2017-06-14 ENCOUNTER — Other Ambulatory Visit: Payer: Self-pay | Admitting: Family Medicine

## 2017-06-14 MED ORDER — FENOFIBRATE 145 MG PO TABS: 145.0000 mg | ORAL_TABLET | Freq: Every day | ORAL | 1 refills | Status: DC

## 2017-06-14 NOTE — Telephone Encounter (Signed)
Pt aware refill sent to pharmacy 

## 2017-07-07 ENCOUNTER — Ambulatory Visit: Payer: Medicaid Other | Admitting: Family Medicine

## 2017-07-07 ENCOUNTER — Encounter: Payer: Self-pay | Admitting: Family Medicine

## 2017-07-07 VITALS — BP 112/70 | HR 101 | Temp 97.3°F | Ht 63.5 in | Wt 146.0 lb

## 2017-07-07 DIAGNOSIS — E1140 Type 2 diabetes mellitus with diabetic neuropathy, unspecified: Secondary | ICD-10-CM

## 2017-07-07 DIAGNOSIS — E1169 Type 2 diabetes mellitus with other specified complication: Secondary | ICD-10-CM

## 2017-07-07 DIAGNOSIS — D72825 Bandemia: Secondary | ICD-10-CM

## 2017-07-07 DIAGNOSIS — Z716 Tobacco abuse counseling: Secondary | ICD-10-CM

## 2017-07-07 DIAGNOSIS — E1159 Type 2 diabetes mellitus with other circulatory complications: Secondary | ICD-10-CM

## 2017-07-07 DIAGNOSIS — I10 Essential (primary) hypertension: Secondary | ICD-10-CM | POA: Diagnosis not present

## 2017-07-07 DIAGNOSIS — E785 Hyperlipidemia, unspecified: Secondary | ICD-10-CM

## 2017-07-07 LAB — BAYER DCA HB A1C WAIVED: HB A1C (BAYER DCA - WAIVED): 6.4 % (ref <7.0)

## 2017-07-07 MED ORDER — NICOTINE 21-14-7 MG/24HR TD KIT: 1.0000 | PACK | Freq: Every day | TRANSDERMAL | 1 refills | Status: DC

## 2017-07-07 MED ORDER — NICOTINE POLACRILEX 4 MG MT GUM: 4.0000 mg | CHEWING_GUM | OROMUCOSAL | 0 refills | Status: DC | PRN

## 2017-07-07 NOTE — Progress Notes (Signed)
BP 112/70   Pulse (!) 101   Temp (!) 97.3 F (36.3 C) (Oral)   Ht 5' 3.5" (1.613 m)   Wt 146 lb (66.2 kg)   LMP 09/20/2011   BMI 25.46 kg/m    Subjective:    Patient ID: Penny Moreno, female    DOB: 09-15-1965, 52 y.o.   MRN: [id number]  HPI: Penny Moreno is a 52 y.o. female presenting on 07/07/2017 for Diabetes (3 month follow up)   HPI Type 2 diabetes mellitus Patient comes in today for recheck of his diabetes. Patient has been currently taking metformin. Patient is not currently on an ACE inhibitor/ARB. Patient has not seen an ophthalmologist this year. Patient denies any issues with their feet.   Hypertension Patient is currently on no medication, and their blood pressure today is 112/70, she has been diet controlled on blood pressure for some time. Patient denies any lightheadedness or dizziness. Patient denies headaches, blurred vision, chest pains, shortness of breath, or weakness. Denies any side effects from medication and is content with current medication.   Hyperlipidemia Patient is coming in for recheck of his hyperlipidemia. The patient is currently taking atorvastatin and fenofibrate. They deny any issues with myalgias or history of liver damage from it. They deny any focal numbness or weakness or chest pain.   Patient on her last lab draw had slightly elevated white blood cells and we will recheck that today it showed bandemia as well.  She denies any current illnesses or respiratory infections.  Patient wants to quit smoking would like to try something to help with that, we will send smoking cessation for her.  Relevant past medical, surgical, family and social history reviewed and updated as indicated. Interim medical history since our last visit reviewed. Allergies and medications reviewed and updated.  Review of Systems  Constitutional: Negative for chills and fever.  HENT: Negative for congestion, ear discharge and ear pain.   Eyes: Negative for  redness and visual disturbance.  Respiratory: Negative for chest tightness and shortness of breath.   Cardiovascular: Negative for chest pain and leg swelling.  Genitourinary: Negative for difficulty urinating and dysuria.  Musculoskeletal: Negative for back pain and gait problem.  Skin: Negative for rash.  Neurological: Negative for dizziness, weakness, light-headedness, numbness and headaches.  Psychiatric/Behavioral: Negative for agitation and behavioral problems.  All other systems reviewed and are negative.   Per HPI unless specifically indicated above   Allergies as of 07/07/2017      Reactions   Tetracyclines & Related Nausea And Vomiting   Latex    Irritates skin    Sulfa Antibiotics Nausea And Vomiting      Medication List        Accurate as of 07/07/17 11:36 AM. Always use your most recent med list.          ALPRAZolam 0.5 MG tablet Commonly known as:  XANAX Take 1 tablet (0.5 mg total) by mouth daily as needed. For anxiety   atorvastatin 40 MG tablet Commonly known as:  LIPITOR TAKE 1 TABLET BY MOUTH EVERY DAY IN THE EVENING   cholecalciferol 1000 units tablet Commonly known as:  VITAMIN D Take 1,000 Units by mouth daily.   cyclobenzaprine 10 MG tablet Commonly known as:  FLEXERIL TAKE 1 TABLET (10 MG TOTAL) BY MOUTH 3 (THREE) TIMES DAILY AS NEEDED FOR MUSCLE SPASMS.   fenofibrate 145 MG tablet Commonly known as:  TRICOR Take 1 tablet (145 mg total) by mouth daily.  FLUoxetine 20 MG tablet Commonly known as:  PROZAC Take 20 mg by mouth daily.   fluticasone 50 MCG/ACT nasal spray Commonly known as:  FLONASE PLACE 1 SPRAY INTO BOTH NOSTRILS 2 (TWO) TIMES DAILY AS NEEDED FOR ALLERGIES OR RHINITIS.   furosemide 20 MG tablet Commonly known as:  LASIX TAKE 1 TABLET (20 MG TOTAL) BY MOUTH DAILY AS NEEDED.   gabapentin 300 MG capsule Commonly known as:  NEURONTIN Take 1 capsule (300 mg total) by mouth at bedtime.   ibuprofen 800 MG tablet Commonly  known as:  ADVIL,MOTRIN Take 1 tablet (800 mg total) by mouth every 6 (six) hours as needed.   lurasidone 40 MG Tabs tablet Commonly known as:  LATUDA Take 40 mg by mouth daily with breakfast. 1/2 to 1 tablet tablet   metFORMIN 500 MG tablet Commonly known as:  GLUCOPHAGE Take 1 tablet (500 mg total) by mouth 2 (two) times daily with a meal.   Nicotine 21-14-7 MG/24HR Kit Place 1 patch onto the skin daily.   nicotine polacrilex 4 MG gum Commonly known as:  NICORETTE Take 1 each (4 mg total) by mouth as needed for smoking cessation.   omeprazole 20 MG capsule Commonly known as:  PRILOSEC Take 2 capsules (40 mg total) by mouth daily.   PROBIOTIC PEARLS PO Take 1 capsule by mouth daily.   Turmeric 500 MG Caps Take 500 mg by mouth daily.          Objective:    BP 112/70   Pulse (!) 101   Temp (!) 97.3 F (36.3 C) (Oral)   Ht 5' 3.5" (1.613 m)   Wt 146 lb (66.2 kg)   LMP 09/20/2011   BMI 25.46 kg/m   Wt Readings from Last 3 Encounters:  07/07/17 146 lb (66.2 kg)  04/02/17 145 lb (65.8 kg)  11/25/16 149 lb 8 oz (67.8 kg)    Physical Exam  Constitutional: She is oriented to person, place, and time. She appears well-developed and well-nourished. No distress.  Eyes: Pupils are equal, round, and reactive to light. Conjunctivae and EOM are normal.  Cardiovascular: Normal rate, regular rhythm, normal heart sounds and intact distal pulses.  No murmur heard. Pulmonary/Chest: Effort normal and breath sounds normal. No respiratory distress. She has no wheezes.  Musculoskeletal: Normal range of motion. She exhibits no edema.  Neurological: She is alert and oriented to person, place, and time. Coordination normal.  Skin: Skin is warm and dry. No rash noted. She is not diaphoretic.  Psychiatric: She has a normal mood and affect. Her behavior is normal.  Nursing note and vitals reviewed.       Assessment & Plan:   Problem List Items Addressed This Visit       Cardiovascular and Mediastinum   Hypertension associated with diabetes ([place]) - Primary   Relevant Orders   CMP14+EGFR (Completed)     Endocrine   Hyperlipidemia associated with type 2 diabetes mellitus ([place])   Type 2 diabetes mellitus with diabetic neuropathy, unspecified ([place])   Relevant Orders   Microalbumin / creatinine urine ratio   Bayer DCA Hb A1c Waived (Completed)   CMP14+EGFR (Completed)     Other   Leukocytosis   Relevant Orders   CBC with Differential/Platelet (Completed)    Other Visit Diagnoses    Tobacco abuse counseling       Relevant Medications   Nicotine 21-14-7 MG/24HR KIT   nicotine polacrilex (NICORETTE) 4 MG gum       Follow  up plan: Return in about 3 months (around 10/07/2017), or if symptoms worsen or fail to improve, for Hypertension and diabetes recheck.  Counseling provided for all of the vaccine components Orders Placed This Encounter  Procedures  . Microalbumin / creatinine urine ratio  . Bayer DCA Hb A1c Waived  . CBC with Differential/Platelet  . [place], MD [facility] 07/07/2017, 11:36 AM

## 2017-07-08 ENCOUNTER — Other Ambulatory Visit: Payer: Self-pay | Admitting: Family Medicine

## 2017-07-08 LAB — CMP14+EGFR
ALBUMIN: 4.3 g/dL (ref 3.5–5.5)
ALK PHOS: 70 IU/L (ref 39–117)
ALT: 15 IU/L (ref 0–32)
AST: 14 IU/L (ref 0–40)
Albumin/Globulin Ratio: 1.7 (ref 1.2–2.2)
BILIRUBIN TOTAL: 0.3 mg/dL (ref 0.0–1.2)
BUN / CREAT RATIO: 10 (ref 9–23)
BUN: 7 mg/dL (ref 6–24)
CHLORIDE: 103 mmol/L (ref 96–106)
CO2: 24 mmol/L (ref 20–29)
CREATININE: 0.72 mg/dL (ref 0.57–1.00)
Calcium: 9.5 mg/dL (ref 8.7–10.2)
GFR calc Af Amer: 114 mL/min/{1.73_m2} (ref >59)
GFR calc non Af Amer: 99 mL/min/{1.73_m2} (ref >59)
GLUCOSE: 144 mg/dL — AB (ref 65–99)
Globulin, Total: 2.5 g/dL (ref 1.5–4.5)
Potassium: 4.2 mmol/L (ref 3.5–5.2)
Sodium: 140 mmol/L (ref 134–144)
Total Protein: 6.8 g/dL (ref 6.0–8.5)

## 2017-07-08 LAB — CBC WITH DIFFERENTIAL/PLATELET
BASOS ABS: 0.1 10*3/uL (ref 0.0–0.2)
Basos: 1 %
EOS (ABSOLUTE): 0.2 10*3/uL (ref 0.0–0.4)
Eos: 2 %
HEMOGLOBIN: 13.8 g/dL (ref 11.1–15.9)
Hematocrit: 40.2 % (ref 34.0–46.6)
Immature Grans (Abs): 0 10*3/uL (ref 0.0–0.1)
Immature Granulocytes: 0 %
LYMPHS ABS: 2.6 10*3/uL (ref 0.7–3.1)
LYMPHS: 29 %
MCH: 31.4 pg (ref 26.6–33.0)
MCHC: 34.3 g/dL (ref 31.5–35.7)
MCV: 92 fL (ref 79–97)
Monocytes Absolute: 0.3 10*3/uL (ref 0.1–0.9)
Monocytes: 4 %
Neutrophils Absolute: 5.7 10*3/uL (ref 1.4–7.0)
Neutrophils: 64 %
PLATELETS: 343 10*3/uL (ref 150–450)
RBC: 4.39 x10E6/uL (ref 3.77–5.28)
RDW: 13.7 % (ref 12.3–15.4)
WBC: 8.8 10*3/uL (ref 3.4–10.8)

## 2017-07-09 ENCOUNTER — Other Ambulatory Visit: Payer: Medicaid Other

## 2017-07-10 LAB — MICROALBUMIN / CREATININE URINE RATIO
Creatinine, Urine: 24.5 mg/dL
Microalb/Creat Ratio: <12.2 mg/g creat (ref 0.0–30.0)
Microalbumin, Urine: <3 ug/mL

## 2017-07-13 MED ORDER — ATORVASTATIN CALCIUM 40 MG PO TABS: ORAL_TABLET | ORAL | 0 refills | Status: DC

## 2017-07-13 NOTE — Telephone Encounter (Signed)
Lipitor sent to [place].   Patient aware

## 2017-07-13 NOTE — Telephone Encounter (Signed)
*  Second Request*  - [facility] was out of lipitor 40 mg needs new Rx sent to [facility]. [facility] is going to call and cancel it with them. Please advise.

## 2017-07-28 ENCOUNTER — Telehealth: Payer: Self-pay

## 2017-07-28 DIAGNOSIS — F603 Borderline personality disorder: Secondary | ICD-10-CM

## 2017-07-28 DIAGNOSIS — F3112 Bipolar disorder, current episode manic without psychotic features, moderate: Secondary | ICD-10-CM

## 2017-07-28 NOTE — Telephone Encounter (Signed)
[facility] - Left Message  
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
## 2017-08-02 ENCOUNTER — Other Ambulatory Visit: Payer: Medicaid Other | Admitting: Obstetrics and Gynecology

## 2017-08-04 NOTE — Progress Notes (Signed)
Will refer to psych/this note writer given her history of bipolar disorder.

## 2017-08-06 ENCOUNTER — Other Ambulatory Visit: Payer: Self-pay | Admitting: Family Medicine

## 2017-08-09 ENCOUNTER — Telehealth: Payer: Self-pay

## 2017-08-09 NOTE — Telephone Encounter (Signed)
Scheduled appt with Dr. Modesta Messing on 08-13-2017  Patient reports improved mood.  Patient's fathe ris being released from ICU.    Patient will be placed on the inactive list. Information routed to the PCP and Dr. Modesta Messing.   Depression screen [facility] 2/9 08/09/2017 07/28/2017 07/07/2017 04/02/2017 11/04/2016  Decreased Interest 2 2 3 3 3   Down, Depressed, Hopeless 2 2 2 3 2   PHQ - 2 Score 4 4 5 6 5   Altered sleeping 0 2 2 3 1   Tired, decreased energy 1 1 1 3 1   Change in appetite 0 0 2 3 0  Feeling bad or failure about yourself  1 1 1  0 1  Trouble concentrating 0 1 3 3  0  Moving slowly or fidgety/restless 0 0 0 2 0  Suicidal thoughts 0 0 0 0 0  PHQ-9 Score 6 9 14 20 8   Difficult doing work/chores Not difficult at all Somewhat difficult - - Somewhat difficult  Some recent data might be hidden     GAD 7 : Generalized Anxiety Score 08/09/2017 07/28/2017  Nervous, Anxious, on Edge 1 2  Control/stop worrying 1 1  Worry too much - different things 1 2  Trouble relaxing 1 1  Restless 1 1  Easily annoyed or irritable 1 2  Afraid - awful might happen 1 1  Total GAD 7 Score 7 10  Anxiety Difficulty Not difficult at all Somewhat difficult

## 2017-08-09 NOTE — Telephone Encounter (Signed)
V[facility] - Left Msg 

## 2017-08-13 ENCOUNTER — Ambulatory Visit (INDEPENDENT_AMBULATORY_CARE_PROVIDER_SITE_OTHER): Payer: Medicaid Other | Admitting: Psychiatry

## 2017-08-13 ENCOUNTER — Encounter (HOSPITAL_COMMUNITY): Payer: Self-pay | Admitting: Psychiatry

## 2017-08-13 VITALS — BP 124/76 | HR 93 | Ht 63.5 in | Wt 148.0 lb

## 2017-08-13 DIAGNOSIS — F4310 Post-traumatic stress disorder, unspecified: Secondary | ICD-10-CM | POA: Insufficient documentation

## 2017-08-13 DIAGNOSIS — F0630 Mood disorder due to known physiological condition, unspecified: Secondary | ICD-10-CM

## 2017-08-13 MED ORDER — HYDROXYZINE HCL 25 MG PO TABS: 25.0000 mg | ORAL_TABLET | Freq: Every day | ORAL | 0 refills | Status: DC | PRN

## 2017-08-13 MED ORDER — ALPRAZOLAM 0.5 MG PO TABS: 0.5000 mg | ORAL_TABLET | Freq: Every evening | ORAL | 0 refills | Status: DC | PRN
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
## 2017-08-13 NOTE — Patient Instructions (Signed)
1. Continue fluoxetine 20 mg daily  2. Increase Latuda 60 mg daily  3. Start hydroxyzine 25 mg daily as needed for anxiety  4. Continue xanax 0.5 mg daily as needed for anxiety at time 5. Return to clinic in one month for 30 mins

## 2017-08-16 ENCOUNTER — Other Ambulatory Visit: Payer: Self-pay | Admitting: Family Medicine

## 2017-08-16 DIAGNOSIS — Z716 Tobacco abuse counseling: Secondary | ICD-10-CM

## 2017-08-17 NOTE — Telephone Encounter (Signed)
Last visit with Dr. Warrick Parisian 07/07/17

## 2017-09-02 ENCOUNTER — Ambulatory Visit: Payer: Medicaid Other | Admitting: Obstetrics and Gynecology

## 2017-09-02 ENCOUNTER — Encounter: Payer: Self-pay | Admitting: Obstetrics and Gynecology

## 2017-09-02 ENCOUNTER — Other Ambulatory Visit (HOSPITAL_COMMUNITY)
Admission: RE | Admit: 2017-09-02 | Discharge: 2017-09-02 | Disposition: A | Discharge status: Home / Self Care | Payer: Medicaid Other | Source: Ambulatory Visit | Attending: Obstetrics and Gynecology | Admitting: Obstetrics and Gynecology

## 2017-09-02 DIAGNOSIS — Z0000 Encounter for general adult medical examination without abnormal findings: Secondary | ICD-10-CM

## 2017-09-02 DIAGNOSIS — Z01419 Encounter for gynecological examination (general) (routine) without abnormal findings: Secondary | ICD-10-CM | POA: Diagnosis not present

## 2017-09-02 DIAGNOSIS — N870 Mild cervical dysplasia: Secondary | ICD-10-CM

## 2017-09-02 DIAGNOSIS — Z9851 Tubal ligation status: Secondary | ICD-10-CM | POA: Insufficient documentation

## 2017-09-02 NOTE — Progress Notes (Signed)
Patient ID: Penny Moreno, female   DOB: 03-Jun-1965, 52 y.o.   MRN: [id number]   Assessment:  Annual Gyn Exam Plan:  1. pap smear done, next pap due in 1 year til at least 5 yr p LEEP , THEN discuss further f/u regimen, since it was cin I. 2. return annually or prn 3    Annual mammogram advised after age 80 Subjective:  Penny Moreno is a 52 y.o. female [id number] who presents for annual exam. Patient's last menstrual period was 09/20/2011. The patient has no complaints today. 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 She has her tubes tied and has had one mammogram. She is struggling with depression for which she sees a therapist. She is on medication and says that it helps. She says she has "destructive thoughts" and "puts herself in harms way unnecessarily." Her back cramped at the time of her visit, but she says that has never happened before. The patient denies fever, chills or any other symptoms or complaints at this time.   The following portions of the patient's history were reviewed and updated as appropriate: allergies, current medications, past family history, past medical history, past social history, past surgical history and problem list. Past Medical History:  Diagnosis Date   Anxiety    Arthritis    Back pain    Bipolar disorder ([place])    Depression    Eating disorder    Frozen shoulder    GERD (gastroesophageal reflux disease)    Leukocytosis 07/02/2015   Neuromuscular disorder ([place])    Fibromyalgia   OCD (obsessive compulsive disorder)    Panic disorder    Vaginal Pap smear, abnormal    Vocal cord polyps     Past Surgical History:  Procedure Laterality Date   DIAGNOSTIC LAPAROSCOPY     [place]   LAPAROSCOPIC TUBAL LIGATION  10/30/2011   Procedure: LAPAROSCOPIC TUBAL LIGATION;  Surgeon: Jonnie Kind, MD;  Location: [facility];  Service: Gynecology;   Laterality: Bilateral;  Laparoscopic Bilateral tubal ligation with falope rings   lt shoulder surgery     TONSILLECTOMY     TUBAL LIGATION  10/04/2011     Current Outpatient Medications:    ALPRAZolam (XANAX) 0.5 MG tablet, Take 1 tablet (0.5 mg total) by mouth daily as needed. For anxiety (Patient taking differently: Take 0.5 mg by mouth 2 (two) times daily as needed. For anxiety), Disp: 30 tablet, Rfl: 0   atorvastatin (LIPITOR) 40 MG tablet, TAKE 1 TABLET BY MOUTH EVERY DAY IN THE EVENING, Disp: 90 tablet, Rfl: 0   cholecalciferol (VITAMIN D) 1000 units tablet, Take 1,000 Units by mouth daily., Disp: , Rfl:    cyclobenzaprine (FLEXERIL) 10 MG tablet, TAKE 1 TABLET (10 MG TOTAL) BY MOUTH 3 (THREE) TIMES DAILY AS NEEDED FOR MUSCLE SPASMS., Disp: 90 tablet, Rfl: 0   fenofibrate (TRICOR) 145 MG tablet, Take 1 tablet (145 mg total) by mouth daily., Disp: 90 tablet, Rfl: 1   FLUoxetine (PROZAC) 20 MG tablet, Take 20 mg by mouth daily., Disp: , Rfl:    fluticasone (FLONASE) 50 MCG/ACT nasal spray, PLACE 1 SPRAY INTO BOTH NOSTRILS 2 (TWO) TIMES DAILY AS NEEDED FOR ALLERGIES OR RHINITIS., Disp: 16 g, Rfl: 5   hydrOXYzine (ATARAX/VISTARIL) 25 MG tablet, Take 1 tablet (25 mg total) by mouth daily as needed for anxiety., Disp: 30 tablet, Rfl: 0  ibuprofen (ADVIL,MOTRIN) 800 MG tablet, Take 1 tablet (800 mg total) by mouth every 6 (six) hours as needed., Disp: 30 tablet, Rfl: 3   lurasidone (LATUDA) 40 MG TABS tablet, Take 40 mg by mouth daily with breakfast. 1/2 to 1 tablet tablet, Disp: , Rfl:    Nicotine 21-14-7 MG/24HR KIT, Place 1 patch onto the skin daily., Disp: 56 each, Rfl: 1   nicotine polacrilex (NICORETTE) 4 MG gum, CHEW AND PARK 1 PIECE AS NEEDED FOR SMOKING CESSATION, Disp: 100 each, Rfl: 0   omeprazole (PRILOSEC) 20 MG capsule, Take 2 capsules (40 mg total) by mouth daily., Disp: 180 capsule, Rfl: 1   Probiotic Product (PROBIOTIC PEARLS PO), Take 1 capsule by mouth daily.,  Disp: , Rfl:    Turmeric 500 MG CAPS, Take 500 mg by mouth daily., Disp: , Rfl:    ALPRAZolam (XANAX) 0.5 MG tablet, Take 1 tablet (0.5 mg total) by mouth at bedtime as needed for anxiety. (Patient not taking: Reported on 09/02/2017), Disp: 30 tablet, Rfl: 0   cyclobenzaprine (FLEXERIL) 10 MG tablet, TAKE 1 TABLET THREE TIMES DAILY AS NEEDED FOR MUSCLE SPASM (Patient not taking: Reported on 09/02/2017), Disp: 90 tablet, Rfl: 0   furosemide (LASIX) 20 MG tablet, TAKE 1 TABLET (20 MG TOTAL) BY MOUTH DAILY AS NEEDED. (Patient not taking: Reported on 09/02/2017), Disp: 30 tablet, Rfl: 3   gabapentin (NEURONTIN) 300 MG capsule, Take 1 capsule (300 mg total) by mouth at bedtime. (Patient not taking: Reported on 09/02/2017), Disp: 90 capsule, Rfl: 3   metFORMIN (GLUCOPHAGE) 500 MG tablet, Take 1 tablet (500 mg total) by mouth 2 (two) times daily with a meal. (Patient not taking: Reported on 09/02/2017), Disp: 60 tablet, Rfl: 5  Review of Systems Constitutional: negative Gastrointestinal: negative Genitourinary: negative  Objective:  BP 112/76 (BP Location: Right Arm, Patient Position: Sitting, Cuff Size: Normal)    Pulse (!) 106    Ht 5' 3.2" (1.605 m)    Wt 148 lb 9.6 oz (67.4 kg)    LMP 09/20/2011    BMI 26.16 kg/m    BMI: Body mass index is 26.16 kg/m.  General Appearance: Alert, appropriate appearance for age. No acute distress HEENT: Grossly normal Neck / Thyroid:  Cardiovascular: RRR; normal S1, S2, no murmur Lungs: CTA bilaterally Back: No CVAT Breast Exam: No dimpling, nipple retraction or discharge. No masses or nodes., Normal to inspection, Normal breast tissue bilaterally and No masses or nodes.No dimpling, nipple retraction or discharge. Gastrointestinal: Soft, non-tender, no masses or organomegaly Pelvic Exam:  Small 5m area of pigmented skin at the posterior perineum (stable x years) Cervix: healed well, easy to examine, no stenosis Rectovaginal: not indicated Lymphatic Exam:  Non-palpable nodes in neck, clavicular, axillary, or inguinal regions Skin: no rash or abnormalities Neurologic: Normal gait and speech, no tremor  Psychiatric: Alert and oriented, appropriate affect.  Urinalysis:Not done   By signing my name below, I, EDe Burrs attest that this documentation has been prepared under the direction and in the presence of FJonnie Kind MD. Electronically Signed: EDe Burrs Medical Scribe. 09/02/17. 2:03 PM.  I personally performed the services described in this documentation, which was SCRIBED in my presence. The recorded information has been reviewed and considered accurate. It has been edited as necessary during review. JJonnie Kind MD
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
## 2017-09-07 LAB — CYTOLOGY - PAP
Diagnosis: NEGATIVE
HPV (WINDOPATH): DETECTED — AB

## 2017-09-13 ENCOUNTER — Other Ambulatory Visit (HOSPITAL_COMMUNITY): Payer: Self-pay | Admitting: Psychiatry

## 2017-09-13 MED ORDER — HYDROXYZINE HCL 25 MG PO TABS: 25.0000 mg | ORAL_TABLET | Freq: Every day | ORAL | 0 refills | Status: DC | PRN

## 2017-09-20 ENCOUNTER — Ambulatory Visit (INDEPENDENT_AMBULATORY_CARE_PROVIDER_SITE_OTHER): Payer: Medicaid Other | Admitting: Psychiatry

## 2017-09-20 ENCOUNTER — Encounter (HOSPITAL_COMMUNITY): Payer: Self-pay | Admitting: Psychiatry

## 2017-09-20 VITALS — BP 125/79 | HR 93 | Ht 63.2 in | Wt 147.0 lb

## 2017-09-20 DIAGNOSIS — F0630 Mood disorder due to known physiological condition, unspecified: Secondary | ICD-10-CM | POA: Diagnosis not present

## 2017-09-20 MED ORDER — LURASIDONE HCL 40 MG PO TABS: 40.0000 mg | ORAL_TABLET | Freq: Every day | ORAL | 0 refills | Status: DC

## 2017-09-20 MED ORDER — FLUOXETINE HCL 40 MG PO CAPS: 40.0000 mg | ORAL_CAPSULE | Freq: Every day | ORAL | 0 refills | Status: DC

## 2017-09-20 NOTE — Telephone Encounter (Signed)
This encounter was created in error - please disregard.

## 2017-09-20 NOTE — Patient Instructions (Signed)
1. Incrase fluoxetine 40 mg daily  2. Continue Latuda 40 mg daily  3. Discontinue hydroxyzine 4. Decrease Xanax 0.25 mg daily as needed for anxiety, then discontinue 5. Return to clinic in three months for 30 mins

## 2017-10-07 ENCOUNTER — Ambulatory Visit: Payer: Medicaid Other | Admitting: Family Medicine

## 2017-11-08 ENCOUNTER — Other Ambulatory Visit: Payer: Self-pay | Admitting: Family Medicine

## 2017-11-08 NOTE — Telephone Encounter (Signed)
Last lipid 04/02/17  Glenard Haring

## 2017-11-16 ENCOUNTER — Ambulatory Visit: Payer: Medicaid Other | Admitting: Physician Assistant

## 2017-12-03 ENCOUNTER — Ambulatory Visit (INDEPENDENT_AMBULATORY_CARE_PROVIDER_SITE_OTHER): Payer: Medicaid Other | Admitting: *Deleted

## 2017-12-03 ENCOUNTER — Ambulatory Visit: Payer: Medicaid Other | Admitting: Physician Assistant

## 2017-12-03 DIAGNOSIS — Z23 Encounter for immunization: Secondary | ICD-10-CM | POA: Diagnosis not present

## 2017-12-09 ENCOUNTER — Encounter: Payer: Self-pay | Admitting: Physician Assistant
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
## 2017-12-20 ENCOUNTER — Ambulatory Visit (HOSPITAL_COMMUNITY): Payer: Self-pay | Admitting: Psychiatry

## 2017-12-22 ENCOUNTER — Ambulatory Visit: Payer: Medicaid Other | Admitting: Physician Assistant

## 2017-12-22 ENCOUNTER — Encounter: Payer: Self-pay | Admitting: Physician Assistant

## 2017-12-22 VITALS — BP 125/87 | HR 97 | Temp 96.9°F | Ht 63.2 in | Wt 148.0 lb

## 2017-12-22 DIAGNOSIS — E1159 Type 2 diabetes mellitus with other circulatory complications: Secondary | ICD-10-CM

## 2017-12-22 DIAGNOSIS — E1140 Type 2 diabetes mellitus with diabetic neuropathy, unspecified: Secondary | ICD-10-CM

## 2017-12-22 DIAGNOSIS — F411 Generalized anxiety disorder: Secondary | ICD-10-CM

## 2017-12-22 DIAGNOSIS — K219 Gastro-esophageal reflux disease without esophagitis: Secondary | ICD-10-CM | POA: Diagnosis not present

## 2017-12-22 DIAGNOSIS — F0630 Mood disorder due to known physiological condition, unspecified: Secondary | ICD-10-CM

## 2017-12-22 DIAGNOSIS — I10 Essential (primary) hypertension: Secondary | ICD-10-CM

## 2017-12-22 DIAGNOSIS — I152 Hypertension secondary to endocrine disorders: Secondary | ICD-10-CM

## 2017-12-22 LAB — CBC WITH DIFFERENTIAL/PLATELET
BASOS: 1 %
Basophils Absolute: 0.1 10*3/uL (ref 0.0–0.2)
EOS (ABSOLUTE): 0.2 10*3/uL (ref 0.0–0.4)
EOS: 2 %
HEMATOCRIT: 37.5 % (ref 34.0–46.6)
Hemoglobin: 13.2 g/dL (ref 11.1–15.9)
Immature Grans (Abs): 0.1 10*3/uL (ref 0.0–0.1)
Immature Granulocytes: 1 %
Lymphocytes Absolute: 3 10*3/uL (ref 0.7–3.1)
Lymphs: 30 %
MCH: 32.9 pg (ref 26.6–33.0)
MCHC: 35.2 g/dL (ref 31.5–35.7)
MCV: 94 fL (ref 79–97)
MONOS ABS: 0.4 10*3/uL (ref 0.1–0.9)
Monocytes: 4 %
Neutrophils Absolute: 6.3 10*3/uL (ref 1.4–7.0)
Neutrophils: 62 %
Platelets: 378 10*3/uL (ref 150–450)
RBC: 4.01 x10E6/uL (ref 3.77–5.28)
RDW: 14.2 % (ref 12.3–15.4)
WBC: 9.9 10*3/uL (ref 3.4–10.8)

## 2017-12-22 LAB — CMP14+EGFR
ALBUMIN: 4.5 g/dL (ref 3.5–5.5)
ALK PHOS: 68 IU/L (ref 39–117)
ALT: 15 IU/L (ref 0–32)
AST: 12 IU/L (ref 0–40)
Albumin/Globulin Ratio: 2 (ref 1.2–2.2)
BUN/Creatinine Ratio: 7 — ABNORMAL LOW (ref 9–23)
BUN: 6 mg/dL (ref 6–24)
Bilirubin Total: <0.2 mg/dL (ref 0.0–1.2)
CALCIUM: 9.2 mg/dL (ref 8.7–10.2)
CO2: 22 mmol/L (ref 20–29)
CREATININE: 0.85 mg/dL (ref 0.57–1.00)
Chloride: 101 mmol/L (ref 96–106)
GFR calc Af Amer: 92 mL/min/{1.73_m2} (ref >59)
GFR, EST NON AFRICAN AMERICAN: 80 mL/min/{1.73_m2} (ref >59)
GLOBULIN, TOTAL: 2.2 g/dL (ref 1.5–4.5)
Glucose: 171 mg/dL — ABNORMAL HIGH (ref 65–99)
Potassium: 4 mmol/L (ref 3.5–5.2)
SODIUM: 139 mmol/L (ref 134–144)
Total Protein: 6.7 g/dL (ref 6.0–8.5)

## 2017-12-22 LAB — BAYER DCA HB A1C WAIVED: HB A1C (BAYER DCA - WAIVED): 6.9 % (ref <7.0)

## 2017-12-22 LAB — LIPID PANEL
CHOL/HDL RATIO: 7.2 ratio — AB (ref 0.0–4.4)
Cholesterol, Total: 180 mg/dL (ref 100–199)
HDL: 25 mg/dL — AB (ref >39)
LDL CALC: 100 mg/dL — AB (ref 0–99)
TRIGLYCERIDES: 274 mg/dL — AB (ref 0–149)
VLDL Cholesterol Cal: 55 mg/dL — ABNORMAL HIGH (ref 5–40)

## 2017-12-22 MED ORDER — LURASIDONE HCL 40 MG PO TABS: 40.0000 mg | ORAL_TABLET | Freq: Every day | ORAL | 3 refills | Status: DC

## 2017-12-22 MED ORDER — OMEPRAZOLE 20 MG PO CPDR: 40.0000 mg | DELAYED_RELEASE_CAPSULE | Freq: Every day | ORAL | 3 refills | Status: DC

## 2017-12-25 DIAGNOSIS — K219 Gastro-esophageal reflux disease without esophagitis: Secondary | ICD-10-CM | POA: Insufficient documentation

## 2017-12-25 NOTE — Progress Notes (Signed)
BP 125/87   Pulse 97   Temp (!) 96.9 F (36.1 C) (Oral)   Ht 5' 3.2" (1.605 m)   Wt 148 lb (67.1 kg)   LMP 09/20/2011   BMI 26.05 kg/m    Subjective:    Patient ID: Penny Moreno, female    DOB: 1965/11/29, 52 y.o.   MRN: [id number]  HPI: Penny Moreno is a 52 y.o. female presenting on 12/22/2017 for Diabetes (3 month. Switching from [place] ); Hyperlipidemia; and Hypertension  This patient comes in for periodic recheck on medications and conditions including diabetes, depression, hyperlipidemia, hypertension.  She has had good readings and feeling well  Will order COLOGUARD for colon cancer screening. Will also order mammogram for breast cancer screening.  She continues with psychiatry at [facility].   All medications are reviewed today. There are no reports of any problems with the medications. All of the medical conditions are reviewed and updated.  Lab work is reviewed and will be ordered as medically necessary. There are no new problems reported with today's visit.   Past Medical History:  Diagnosis Date  . Anxiety   . Arthritis   . Back pain   . Bipolar disorder ([place])   . Depression   . Eating disorder   . Frozen shoulder   . GERD (gastroesophageal reflux disease)   . Leukocytosis 07/02/2015  . Neuromuscular disorder (HCC)    Fibromyalgia  . OCD (obsessive compulsive disorder)   . Panic disorder   . Vaginal Pap smear, abnormal   . Vocal cord polyps    Relevant past medical, surgical, family and social history reviewed and updated as indicated. Interim medical history since our last visit reviewed. Allergies and medications reviewed and updated. DATA REVIEWED: CHART IN EPIC  Family History reviewed for pertinent findings.  Review of Systems  Constitutional: Negative.   HENT: Negative.   Eyes: Negative.   Respiratory: Negative.   Gastrointestinal: Negative.   Genitourinary: Negative.     Allergies as of 12/22/2017      Reactions   Tetracyclines & Related Nausea And Vomiting   Latex    Irritates skin    Sulfa Antibiotics Nausea And Vomiting      Medication List        Accurate as of 12/22/17 11:59 PM. Always use your most recent med list.          ALPRAZolam 0.5 MG tablet Commonly known as:  XANAX Take 1 tablet (0.5 mg total) by mouth daily as needed. For anxiety   atorvastatin 40 MG tablet Commonly known as:  LIPITOR TAKE 1 TABLET BY MOUTH EVERY DAY IN THE EVENING   cholecalciferol 1000 units tablet Commonly known as:  VITAMIN D Take 1,000 Units by mouth daily.   cyclobenzaprine 10 MG tablet Commonly known as:  FLEXERIL TAKE 1 TABLET THREE TIMES DAILY AS NEEDED FOR MUSCLE SPASM   fenofibrate 145 MG tablet Commonly known as:  TRICOR Take 1 tablet (145 mg total) by mouth daily.   FLUoxetine 40 MG capsule Commonly known as:  PROZAC Take 1 capsule (40 mg total) by mouth daily.   fluticasone 50 MCG/ACT nasal spray Commonly known as:  FLONASE PLACE 1 SPRAY INTO BOTH NOSTRILS 2 (TWO) TIMES DAILY AS NEEDED FOR ALLERGIES OR RHINITIS.   furosemide 20 MG tablet Commonly known as:  LASIX TAKE 1 TABLET (20 MG TOTAL) BY MOUTH DAILY AS NEEDED.   gabapentin 300 MG capsule Commonly known as:  NEURONTIN Take 1 capsule (300  mg total) by mouth at bedtime.   ibuprofen 800 MG tablet Commonly known as:  ADVIL,MOTRIN Take 1 tablet (800 mg total) by mouth every 6 (six) hours as needed.   lurasidone 40 MG Tabs tablet Commonly known as:  LATUDA Take 1 tablet (40 mg total) by mouth daily with breakfast.   metFORMIN 500 MG tablet Commonly known as:  GLUCOPHAGE Take 1 tablet (500 mg total) by mouth 2 (two) times daily with a meal.   omeprazole 20 MG capsule Commonly known as:  PRILOSEC Take 2 capsules (40 mg total) by mouth daily.   PROBIOTIC PEARLS PO Take 1 capsule by mouth daily.   Turmeric 500 MG Caps Take 500 mg by mouth daily.          Objective:    BP 125/87   Pulse 97   Temp (!) 96.9  F (36.1 C) (Oral)   Ht 5' 3.2" (1.605 m)   Wt 148 lb (67.1 kg)   LMP 09/20/2011   BMI 26.05 kg/m   Allergies  Allergen Reactions  . Tetracyclines & Related Nausea And Vomiting  . Latex     Irritates skin   . Sulfa Antibiotics Nausea And Vomiting    Wt Readings from Last 3 Encounters:  12/22/17 148 lb (67.1 kg)  09/02/17 148 lb 9.6 oz (67.4 kg)  07/07/17 146 lb (66.2 kg)    Physical Exam  Constitutional: She is oriented to person, place, and time. She appears well-developed and well-nourished.  HENT:  Head: Normocephalic and atraumatic.  Eyes: Pupils are equal, round, and reactive to light. Conjunctivae and EOM are normal.  Cardiovascular: Normal rate, regular rhythm, normal heart sounds and intact distal pulses.  Pulmonary/Chest: Effort normal and breath sounds normal.  Abdominal: Soft. Bowel sounds are normal.  Neurological: She is alert and oriented to person, place, and time. She has normal reflexes.  Skin: Skin is warm and dry. No rash noted.  Psychiatric: She has a normal mood and affect. Her behavior is normal. Judgment and thought content normal.    Results for orders placed or performed in visit on 12/22/17  CMP14+EGFR  Result Value Ref Range   Glucose 171 (H) 65 - 99 mg/dL   BUN 6 6 - 24 mg/dL   Creatinine, Ser 0.85 0.57 - 1.00 mg/dL   GFR calc non Af Amer 80 >59 mL/min/1.73   GFR calc Af Amer 92 >59 mL/min/1.73   BUN/Creatinine Ratio 7 (L) 9 - 23   Sodium 139 134 - 144 mmol/L   Potassium 4.0 3.5 - 5.2 mmol/L   Chloride 101 96 - 106 mmol/L   CO2 22 20 - 29 mmol/L   Calcium 9.2 8.7 - 10.2 mg/dL   Total Protein 6.7 6.0 - 8.5 g/dL   Albumin 4.5 3.5 - 5.5 g/dL   Globulin, Total 2.2 1.5 - 4.5 g/dL   Albumin/Globulin Ratio 2.0 1.2 - 2.2   Bilirubin Total <0.2 0.0 - 1.2 mg/dL   Alkaline Phosphatase 68 39 - 117 IU/L   AST 12 0 - 40 IU/L   ALT 15 0 - 32 IU/L  CBC with Differential/Platelet  Result Value Ref Range   WBC 9.9 3.4 - 10.8 x10E3/uL   RBC 4.01 3.77  - 5.28 x10E6/uL   Hemoglobin 13.2 11.1 - 15.9 g/dL   Hematocrit 37.5 34.0 - 46.6 %   MCV 94 79 - 97 fL   MCH 32.9 26.6 - 33.0 pg   MCHC 35.2 31.5 - 35.7 g/dL   RDW  14.2 12.3 - 15.4 %   Platelets 378 150 - 450 x10E3/uL   Neutrophils 62 Not Estab. %   Lymphs 30 Not Estab. %   Monocytes 4 Not Estab. %   Eos 2 Not Estab. %   Basos 1 Not Estab. %   Neutrophils Absolute 6.3 1.4 - 7.0 x10E3/uL   Lymphocytes Absolute 3.0 0.7 - 3.1 x10E3/uL   Monocytes Absolute 0.4 0.1 - 0.9 x10E3/uL   EOS (ABSOLUTE) 0.2 0.0 - 0.4 x10E3/uL   Basophils Absolute 0.1 0.0 - 0.2 x10E3/uL   Immature Granulocytes 1 Not Estab. %   Immature Grans (Abs) 0.1 0.0 - 0.1 x10E3/uL  Lipid panel  Result Value Ref Range   Cholesterol, Total 180 100 - 199 mg/dL   Triglycerides 274 (H) 0 - 149 mg/dL   HDL 25 (L) >39 mg/dL   VLDL Cholesterol Cal 55 (H) 5 - 40 mg/dL   LDL Calculated 100 (H) 0 - 99 mg/dL   Chol/HDL Ratio 7.2 (H) 0.0 - 4.4 ratio  Bayer DCA Hb A1c Waived  Result Value Ref Range   HB A1C (BAYER DCA - WAIVED) 6.9 <7.0 %      Assessment & Plan:   1. Gastroesophageal reflux disease, esophagitis presence not specified - omeprazole (PRILOSEC) 20 MG capsule; Take 2 capsules (40 mg total) by mouth daily.  Dispense: 180 capsule; Refill: 3  2. Type 2 diabetes mellitus with diabetic neuropathy, without long-term current use of insulin (HCC) - CMP14+EGFR - CBC with Differential/Platelet - Lipid panel - Bayer DCA Hb A1c Waived  3. Hypertension associated with diabetes ([place]) - CMP14+EGFR - CBC with Differential/Platelet - Lipid panel - Bayer DCA Hb A1c Waived  4. Mood disorder in conditions classified elsewhere Continue medications  5. GAD (generalized anxiety disorder) Continue medications   Continue all other maintenance medications as listed above.  Follow up plan: No follow-ups on file.  Educational handout given for [place] PA-C [place] [address]  [place], [place] [address] [phone]   12/25/2017, 4:48 PM

## 2017-12-28 ENCOUNTER — Other Ambulatory Visit: Payer: Self-pay | Admitting: Family Medicine

## 2017-12-29 ENCOUNTER — Other Ambulatory Visit: Payer: Self-pay | Admitting: *Deleted

## 2017-12-29 MED ORDER — FLUOXETINE HCL 40 MG PO CAPS: 40.0000 mg | ORAL_CAPSULE | Freq: Every day | ORAL | 0 refills | Status: DC
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
## 2018-01-13 ENCOUNTER — Encounter (HOSPITAL_COMMUNITY): Payer: Self-pay | Admitting: Psychiatry

## 2018-01-13 ENCOUNTER — Ambulatory Visit (INDEPENDENT_AMBULATORY_CARE_PROVIDER_SITE_OTHER): Payer: Medicaid Other | Admitting: Psychiatry

## 2018-01-13 VITALS — BP 165/85 | HR 94 | Ht 63.2 in | Wt 146.0 lb

## 2018-01-13 DIAGNOSIS — F4310 Post-traumatic stress disorder, unspecified: Secondary | ICD-10-CM

## 2018-01-13 DIAGNOSIS — F0630 Mood disorder due to known physiological condition, unspecified: Secondary | ICD-10-CM

## 2018-01-13 NOTE — Patient Instructions (Addendum)
1. Continue fluoxetine 40 mg daily  2. Continue Latuda 40 mg daily  3. Return to clinic as needed

## 2018-02-28 ENCOUNTER — Other Ambulatory Visit: Payer: Self-pay | Admitting: Physician Assistant

## 2018-03-22 ENCOUNTER — Ambulatory Visit: Payer: Medicaid Other | Admitting: Physician Assistant

## 2018-03-22 ENCOUNTER — Encounter: Payer: Self-pay | Admitting: Physician Assistant

## 2018-03-22 VITALS — BP 114/71 | HR 103 | Temp 97.8°F | Ht 63.2 in | Wt 151.0 lb

## 2018-03-22 DIAGNOSIS — R300 Dysuria: Secondary | ICD-10-CM | POA: Diagnosis not present

## 2018-03-22 DIAGNOSIS — E1140 Type 2 diabetes mellitus with diabetic neuropathy, unspecified: Secondary | ICD-10-CM | POA: Diagnosis not present

## 2018-03-22 DIAGNOSIS — N3000 Acute cystitis without hematuria: Secondary | ICD-10-CM | POA: Diagnosis not present

## 2018-03-22 LAB — URINALYSIS, COMPLETE
Bilirubin, UA: NEGATIVE
Glucose, UA: NEGATIVE
Ketones, UA: NEGATIVE
Leukocytes, UA: NEGATIVE
NITRITE UA: NEGATIVE
PH UA: 6 (ref 5.0–7.5)
Protein, UA: NEGATIVE
RBC, UA: NEGATIVE
Specific Gravity, UA: 1.01 (ref 1.005–1.030)
UUROB: 0.2 mg/dL (ref 0.2–1.0)

## 2018-03-22 LAB — MICROSCOPIC EXAMINATION: Renal Epithel, UA: NONE SEEN /hpf

## 2018-03-22 LAB — BAYER DCA HB A1C WAIVED: HB A1C: 7.3 % — AB (ref <7.0)

## 2018-03-22 MED ORDER — NITROFURANTOIN MONOHYD MACRO 100 MG PO CAPS: 100.0000 mg | ORAL_CAPSULE | Freq: Two times a day (BID) | ORAL | 0 refills | Status: DC

## 2018-03-22 MED ORDER — FLUCONAZOLE 150 MG PO TABS: ORAL_TABLET | ORAL | 0 refills | Status: DC

## 2018-03-23 LAB — CMP14+EGFR
ALT: 16 IU/L (ref 0–32)
AST: 15 IU/L (ref 0–40)
Albumin/Globulin Ratio: 2.1 (ref 1.2–2.2)
Albumin: 4.4 g/dL (ref 3.8–4.8)
Alkaline Phosphatase: 78 IU/L (ref 39–117)
BUN/Creatinine Ratio: 8 — ABNORMAL LOW (ref 9–23)
BUN: 5 mg/dL — ABNORMAL LOW (ref 6–24)
CHLORIDE: 99 mmol/L (ref 96–106)
CO2: 23 mmol/L (ref 20–29)
Calcium: 9.5 mg/dL (ref 8.7–10.2)
Creatinine, Ser: 0.65 mg/dL (ref 0.57–1.00)
GFR calc Af Amer: 120 mL/min/{1.73_m2} (ref >59)
GFR calc non Af Amer: 104 mL/min/{1.73_m2} (ref >59)
Globulin, Total: 2.1 g/dL (ref 1.5–4.5)
Glucose: 198 mg/dL — ABNORMAL HIGH (ref 65–99)
Potassium: 4.4 mmol/L (ref 3.5–5.2)
Sodium: 139 mmol/L (ref 134–144)
Total Protein: 6.5 g/dL (ref 6.0–8.5)

## 2018-03-24 LAB — URINE CULTURE

## 2018-03-24 NOTE — Progress Notes (Signed)
BP 114/71   Pulse (!) 103   Temp 97.8 F (36.6 C) (Oral)   Ht 5' 3.2" (1.605 m)   Wt 151 lb (68.5 kg)   LMP 09/20/2011   BMI 26.58 kg/m    Subjective:    Patient ID: Penny Moreno, female    DOB: 1965/02/06, 53 y.o.   MRN: [id number]  HPI: Penny Moreno is a 53 y.o. female presenting on 03/22/2018 for Urinary Tract Infection (odor to urine x 1 month )  This patient has had several days of dysuria, frequency and nocturia. There is also pain over the bladder in the suprapubic region, no back pain. Denies leakage or hematuria.  Denies fever or chills. No pain in flank area.   Past Medical History:  Diagnosis Date  . Anxiety   . Arthritis   . Back pain   . Bipolar disorder ([place])   . Depression   . Eating disorder   . Frozen shoulder   . GERD (gastroesophageal reflux disease)   . Leukocytosis 07/02/2015  . Neuromuscular disorder (HCC)    Fibromyalgia  . OCD (obsessive compulsive disorder)   . Panic disorder   . Vaginal Pap smear, abnormal   . Vocal cord polyps    Relevant past medical, surgical, family and social history reviewed and updated as indicated. Interim medical history since our last visit reviewed. Allergies and medications reviewed and updated. DATA REVIEWED: CHART IN EPIC  Family History reviewed for pertinent findings.  Review of Systems  Constitutional: Negative.   HENT: Negative.   Eyes: Negative.   Respiratory: Negative.   Gastrointestinal: Negative.   Genitourinary: Positive for difficulty urinating, dysuria and urgency. Negative for flank pain.    Allergies as of 03/22/2018      Reactions   Tetracyclines & Related Nausea And Vomiting   Latex    Irritates skin    Sulfa Antibiotics Nausea And Vomiting      Medication List       Accurate as of March 22, 2018 11:59 PM. Always use your most recent med list.        ALPRAZolam 0.5 MG tablet Commonly known as:  XANAX Take 1 tablet (0.5 mg total) by mouth daily as needed. For  anxiety   atorvastatin 40 MG tablet Commonly known as:  LIPITOR TAKE 1 TABLET BY MOUTH EVERY DAY IN THE EVENING   cholecalciferol 1000 units tablet Commonly known as:  VITAMIN D Take 1,000 Units by mouth daily.   cyclobenzaprine 10 MG tablet Commonly known as:  FLEXERIL TAKE 1 TABLET THREE TIMES DAILY AS NEEDED FOR MUSCLE SPASM   cyclobenzaprine 10 MG tablet Commonly known as:  FLEXERIL TAKE 1 TABLET THREE TIMES DAILY AS NEEDED FOR MUSCLE SPASM   fenofibrate 145 MG tablet Commonly known as:  TRICOR TAKE 1 TABLET DAILY   fluconazole 150 MG tablet Commonly known as:  DIFLUCAN 1 po q week x 2 weeks   FLUoxetine 40 MG capsule Commonly known as:  PROZAC Take 1 capsule (40 mg total) by mouth daily.   fluticasone 50 MCG/ACT nasal spray Commonly known as:  FLONASE PLACE 1 SPRAY INTO BOTH NOSTRILS 2 (TWO) TIMES DAILY AS NEEDED FOR ALLERGIES OR RHINITIS.   furosemide 20 MG tablet Commonly known as:  LASIX TAKE 1 TABLET (20 MG TOTAL) BY MOUTH DAILY AS NEEDED.   gabapentin 300 MG capsule Commonly known as:  NEURONTIN Take 1 capsule (300 mg total) by mouth at bedtime.   ibuprofen 800 MG tablet Commonly  known as:  ADVIL,MOTRIN Take 1 tablet (800 mg total) by mouth every 6 (six) hours as needed.   lurasidone 40 MG Tabs tablet Commonly known as:  LATUDA Take 1 tablet (40 mg total) by mouth daily with breakfast.   metFORMIN 500 MG tablet Commonly known as:  GLUCOPHAGE Take 1 tablet (500 mg total) by mouth 2 (two) times daily with a meal.   nitrofurantoin (macrocrystal-monohydrate) 100 MG capsule Commonly known as:  MACROBID Take 1 capsule (100 mg total) by mouth 2 (two) times daily. 1 po BId   omeprazole 20 MG capsule Commonly known as:  PRILOSEC Take 2 capsules (40 mg total) by mouth daily.   PROBIOTIC PEARLS PO Take 1 capsule by mouth daily.          Objective:    BP 114/71   Pulse (!) 103   Temp 97.8 F (36.6 C) (Oral)   Ht 5' 3.2" (1.605 m)   Wt 151 lb  (68.5 kg)   LMP 09/20/2011   BMI 26.58 kg/m   Allergies  Allergen Reactions  . Tetracyclines & Related Nausea And Vomiting  . Latex     Irritates skin   . Sulfa Antibiotics Nausea And Vomiting    Wt Readings from Last 3 Encounters:  03/22/18 151 lb (68.5 kg)  12/22/17 148 lb (67.1 kg)  09/02/17 148 lb 9.6 oz (67.4 kg)    Physical Exam Constitutional:      Appearance: She is well-developed.  HENT:     Head: Normocephalic and atraumatic.  Eyes:     Conjunctiva/sclera: Conjunctivae normal.     Pupils: Pupils are equal, round, and reactive to light.  Cardiovascular:     Rate and Rhythm: Normal rate and regular rhythm.     Heart sounds: Normal heart sounds.  Pulmonary:     Effort: Pulmonary effort is normal.     Breath sounds: Normal breath sounds.  Abdominal:     General: Bowel sounds are normal. There is no distension.     Palpations: Abdomen is soft. There is no mass.     Tenderness: There is abdominal tenderness in the suprapubic area. There is no guarding or rebound.  Skin:    General: Skin is warm and dry.     Findings: No rash.  Neurological:     Mental Status: She is alert and oriented to person, place, and time.     Deep Tendon Reflexes: Reflexes are normal and symmetric.  Psychiatric:        Behavior: Behavior normal.        Thought Content: Thought content normal.        Judgment: Judgment normal.     Results for orders placed or performed in visit on 03/22/18  Urine Culture  Result Value Ref Range   Urine Culture, Routine Final report (A)    Organism ID, Bacteria Escherichia coli (A)    ORGANISM ID, BACTERIA Comment    Antimicrobial Susceptibility Comment   Microscopic Examination  Result Value Ref Range   WBC, UA 11-30 (A) 0 - 5 /hpf   RBC, UA 0-2 0 - 2 /hpf   Epithelial Cells (non renal) 0-10 0 - 10 /hpf   Renal Epithel, UA None seen None seen /hpf   Bacteria, UA Few None seen/Few   Yeast, UA Present None seen  Urinalysis, Complete  Result  Value Ref Range   Specific Gravity, UA 1.010 1.005 - 1.030   pH, UA 6.0 5.0 - 7.5   Color, UA  Yellow Yellow   Appearance Ur Clear Clear   Leukocytes, UA Negative Negative   Protein, UA Negative Negative/Trace   Glucose, UA Negative Negative   Ketones, UA Negative Negative   RBC, UA Negative Negative   Bilirubin, UA Negative Negative   Urobilinogen, Ur 0.2 0.2 - 1.0 mg/dL   Nitrite, UA Negative Negative   Microscopic Examination See below:   CMP14+EGFR  Result Value Ref Range   Glucose 198 (H) 65 - 99 mg/dL   BUN 5 (L) 6 - 24 mg/dL   Creatinine, Ser 0.65 0.57 - 1.00 mg/dL   GFR calc non Af Amer 104 >59 mL/min/1.73   GFR calc Af Amer 120 >59 mL/min/1.73   BUN/Creatinine Ratio 8 (L) 9 - 23   Sodium 139 134 - 144 mmol/L   Potassium 4.4 3.5 - 5.2 mmol/L   Chloride 99 96 - 106 mmol/L   CO2 23 20 - 29 mmol/L   Calcium 9.5 8.7 - 10.2 mg/dL   Total Protein 6.5 6.0 - 8.5 g/dL   Albumin 4.4 3.8 - 4.8 g/dL   Globulin, Total 2.1 1.5 - 4.5 g/dL   Albumin/Globulin Ratio 2.1 1.2 - 2.2   Bilirubin Total <0.2 0.0 - 1.2 mg/dL   Alkaline Phosphatase 78 39 - 117 IU/L   AST 15 0 - 40 IU/L   ALT 16 0 - 32 IU/L  Bayer DCA Hb A1c Waived  Result Value Ref Range   HB A1C (BAYER DCA - WAIVED) 7.3 (H) <7.0 %      Assessment & Plan:   1. Dysuria - Urine Culture - Urinalysis, Complete - Microscopic Examination  2. Acute cystitis without hematuria - nitrofurantoin, macrocrystal-monohydrate, (MACROBID) 100 MG capsule; Take 1 capsule (100 mg total) by mouth 2 (two) times daily. 1 po BId  Dispense: 14 capsule; Refill: 0 - fluconazole (DIFLUCAN) 150 MG tablet; 1 po q week x 2 weeks  Dispense: 2 tablet; Refill: 0  3. Type 2 diabetes mellitus with diabetic neuropathy, without long-term current use of insulin (HCC) - CMP14+EGFR - Bayer DCA Hb A1c Waived   Continue all other maintenance medications as listed above.  Follow up plan: Return in about 3 months (around 06/20/2018).  Educational  handout given for [place] PA-C [place] [address]  [place], [place] [address] [phone]   03/24/2018, 2:15 PM

## 2018-04-18 ENCOUNTER — Other Ambulatory Visit: Payer: Self-pay | Admitting: Physician Assistant

## 2018-04-18 ENCOUNTER — Other Ambulatory Visit: Payer: Self-pay | Admitting: Family Medicine

## 2018-04-29 ENCOUNTER — Other Ambulatory Visit: Payer: Self-pay | Admitting: Physician Assistant

## 2018-04-29 ENCOUNTER — Telehealth: Payer: Self-pay | Admitting: Physician Assistant

## 2018-04-29 MED ORDER — FLUCONAZOLE 150 MG PO TABS: ORAL_TABLET | ORAL | 0 refills | Status: DC

## 2018-04-29 MED ORDER — CIPROFLOXACIN HCL 500 MG PO TABS: 500.0000 mg | ORAL_TABLET | Freq: Two times a day (BID) | ORAL | 0 refills | Status: DC

## 2018-04-29 NOTE — Telephone Encounter (Signed)
Cipro and Diflucan are sent to your pharmacy.

## 2018-04-29 NOTE — Telephone Encounter (Signed)
Pt states that she still has a bladder infection wants to know if AJ could send her something in for it and a Diflucan as well (PT was coughing while I had her on the phone taking this message)  Pharmacy: [facility]

## 2018-06-22 ENCOUNTER — Ambulatory Visit: Payer: Medicaid Other | Admitting: Physician Assistant

## 2018-07-08 ENCOUNTER — Other Ambulatory Visit: Payer: Self-pay | Admitting: Physician Assistant

## 2018-07-18 ENCOUNTER — Other Ambulatory Visit: Payer: Self-pay

## 2018-07-18 ENCOUNTER — Telehealth: Payer: Self-pay | Admitting: Physician Assistant

## 2018-07-18 NOTE — Telephone Encounter (Signed)
Pt aware  - will do TELE

## 2018-07-18 NOTE — Telephone Encounter (Signed)
Pt states that she has a cough congestion and sob please call back to evaluate if ok to come in for apt tomorrow

## 2018-07-19 ENCOUNTER — Ambulatory Visit (INDEPENDENT_AMBULATORY_CARE_PROVIDER_SITE_OTHER): Payer: Medicaid Other | Admitting: Physician Assistant

## 2018-07-19 DIAGNOSIS — E1140 Type 2 diabetes mellitus with diabetic neuropathy, unspecified: Secondary | ICD-10-CM

## 2018-07-19 DIAGNOSIS — F331 Major depressive disorder, recurrent, moderate: Secondary | ICD-10-CM | POA: Diagnosis not present

## 2018-07-19 DIAGNOSIS — M9901 Segmental and somatic dysfunction of cervical region: Secondary | ICD-10-CM | POA: Insufficient documentation

## 2018-07-19 DIAGNOSIS — I10 Essential (primary) hypertension: Secondary | ICD-10-CM

## 2018-07-19 DIAGNOSIS — M797 Fibromyalgia: Secondary | ICD-10-CM | POA: Diagnosis not present

## 2018-07-19 DIAGNOSIS — E1159 Type 2 diabetes mellitus with other circulatory complications: Secondary | ICD-10-CM | POA: Diagnosis not present

## 2018-07-19 MED ORDER — ALPRAZOLAM 0.5 MG PO TABS: 0.5000 mg | ORAL_TABLET | Freq: Two times a day (BID) | ORAL | 0 refills | Status: DC | PRN

## 2018-07-19 MED ORDER — FENOFIBRATE 145 MG PO TABS: 145.0000 mg | ORAL_TABLET | Freq: Every day | ORAL | 1 refills | Status: DC

## 2018-07-19 MED ORDER — FLUOXETINE HCL 40 MG PO CAPS: 40.0000 mg | ORAL_CAPSULE | Freq: Every day | ORAL | 1 refills | Status: DC

## 2018-07-19 MED ORDER — ATORVASTATIN CALCIUM 40 MG PO TABS: 40.0000 mg | ORAL_TABLET | Freq: Every evening | ORAL | 1 refills | Status: DC

## 2018-07-19 MED ORDER — GABAPENTIN 300 MG PO CAPS: 300.0000 mg | ORAL_CAPSULE | Freq: Every day | ORAL | 3 refills | Status: DC

## 2018-07-19 MED ORDER — NABUMETONE 500 MG PO TABS: 500.0000 mg | ORAL_TABLET | Freq: Two times a day (BID) | ORAL | 1 refills | Status: DC

## 2018-07-19 NOTE — Progress Notes (Signed)
Telephone visit  Subjective: CB:ULAGTXMI medical conditions PCP: Terald Sleeper, PA-C [id number] C Docter is a 53 y.o. female calls for telephone consult today. Patient provides verbal consent for consult held via phone.  Patient is identified with 2 separate identifiers.  At this time the entire area is on COVID-19 social distancing and stay home orders are in place.  Patient is of higher risk and therefore we are performing this by a virtual method.  Location of patient: home Location of provider: [facility] Others present for call: no  Patient was having a follow-up on her chronic medical conditions which do include depression, hyperlipidemia, hypertension, type 2 diabetes, cervical somatic dysfunction that is chronic.  She is having a flareup of the neck at this time.  She has not really taken any medication for it in some time.  She states that overall she was doing generally well with her depression anxiety.  Refills are needed.  She also needs to come in for fasting labs at her next earliest convenience.  Lab order will be placed.     ROS: Per HPI  Allergies  Allergen Reactions  . Tetracyclines & Related Nausea And Vomiting  . Latex     Irritates skin   . Sulfa Antibiotics Nausea And Vomiting   Past Medical History:  Diagnosis Date  . Anxiety   . Arthritis   . Back pain   . Bipolar disorder ([place])   . Depression   . Eating disorder   . Frozen shoulder   . GERD (gastroesophageal reflux disease)   . Leukocytosis 07/02/2015  . Neuromuscular disorder (HCC)    Fibromyalgia  . OCD (obsessive compulsive disorder)   . Panic disorder   . Vaginal Pap smear, abnormal   . Vocal cord polyps     Current Outpatient Medications:  .  ALPRAZolam (XANAX) 0.5 MG tablet, Take 1 tablet (0.5 mg total) by mouth 2 (two) times daily as needed. For anxiety, Disp: 30 tablet, Rfl: 0 .  atorvastatin (LIPITOR) 40 MG tablet, Take 1 tablet (40 mg total) by mouth every evening., Disp: 90  tablet, Rfl: 1 .  cholecalciferol (VITAMIN D) 1000 units tablet, Take 1,000 Units by mouth daily., Disp: , Rfl:  .  cyclobenzaprine (FLEXERIL) 10 MG tablet, TAKE 1 TABLET THREE TIMES DAILY AS NEEDED FOR MUSCLE SPASM, Disp: 90 tablet, Rfl: 0 .  fenofibrate (TRICOR) 145 MG tablet, Take 1 tablet (145 mg total) by mouth daily., Disp: 90 tablet, Rfl: 1 .  fluconazole (DIFLUCAN) 150 MG tablet, 1 po q week x 4 weeks, Disp: 4 tablet, Rfl: 0 .  FLUoxetine (PROZAC) 40 MG capsule, Take 1 capsule (40 mg total) by mouth daily., Disp: 90 capsule, Rfl: 1 .  fluticasone (FLONASE) 50 MCG/ACT nasal spray, 1 SPRAY IN EACH NOSTRIL 2 TIMES A DAY AS NEEDED FOR ALLERGY OR RHINITIS, Disp: 16 g, Rfl: 2 .  furosemide (LASIX) 20 MG tablet, TAKE 1 TABLET (20 MG TOTAL) BY MOUTH DAILY AS NEEDED., Disp: 30 tablet, Rfl: 3 .  gabapentin (NEURONTIN) 300 MG capsule, Take 1-2 capsules (300-600 mg total) by mouth at bedtime., Disp: 180 capsule, Rfl: 3 .  lurasidone (LATUDA) 40 MG TABS tablet, Take 1 tablet (40 mg total) by mouth daily with breakfast., Disp: 90 tablet, Rfl: 3 .  nabumetone (RELAFEN) 500 MG tablet, Take 1 tablet (500 mg total) by mouth 2 (two) times daily., Disp: 60 tablet, Rfl: 1 .  omeprazole (PRILOSEC) 20 MG capsule, Take 2 capsules (40 mg  total) by mouth daily., Disp: 180 capsule, Rfl: 3 .  Probiotic Product (PROBIOTIC PEARLS PO), Take 1 capsule by mouth daily., Disp: , Rfl:   Assessment/ Plan: 53 y.o. female   1. Depression - FLUoxetine (PROZAC) 40 MG capsule; Take 1 capsule (40 mg total) by mouth daily.  Dispense: 90 capsule; Refill: 1 - ALPRAZolam (XANAX) 0.5 MG tablet; Take 1 tablet (0.5 mg total) by mouth 2 (two) times daily as needed. For anxiety  Dispense: 30 tablet; Refill: 0  2. Fibromyalgia - gabapentin (NEURONTIN) 300 MG capsule; Take 1-2 capsules (300-600 mg total) by mouth at bedtime.  Dispense: 180 capsule; Refill: 3  3. Hypertension associated with diabetes ([place]) - Urinalysis, Complete - CBC  with Differential/Platelet; Future - CMP14+EGFR; Future - Lipid panel; Future - TSH; Future - Urinalysis, Complete; Future - Bayer DCA Hb A1c Waived; Future  4. Type 2 diabetes mellitus with diabetic neuropathy, without long-term current use of insulin (HCC) - Urinalysis, Complete - CBC with Differential/Platelet; Future - CMP14+EGFR; Future - Lipid panel; Future - TSH; Future - Urinalysis, Complete; Future - Bayer DCA Hb A1c Waived; Future  5. Cervical somatic dysfunction - nabumetone (RELAFEN) 500 MG tablet; Take 1 tablet (500 mg total) by mouth 2 (two) times daily.  Dispense: 60 tablet; Refill: 1   Continue all other maintenance medications as listed above.  Start time: 2:07 PM End time: 2:21 PM  Meds ordered this encounter  Medications  . DISCONTD: ALPRAZolam (XANAX) 0.5 MG tablet    Sig: Take 1 tablet (0.5 mg total) by mouth 2 (two) times daily as needed. For anxiety    Dispense:  30 tablet    Refill:  0    Order Specific Question:   Supervising Provider    Answer:   Janora Norlander [[id number]]  . nabumetone (RELAFEN) 500 MG tablet    Sig: Take 1 tablet (500 mg total) by mouth 2 (two) times daily.    Dispense:  60 tablet    Refill:  1    Order Specific Question:   Supervising Provider    Answer:   Janora Norlander [[id number]]  . FLUoxetine (PROZAC) 40 MG capsule    Sig: Take 1 capsule (40 mg total) by mouth daily.    Dispense:  90 capsule    Refill:  1    Order Specific Question:   Supervising Provider    Answer:   Janora Norlander [[id number]]  . fenofibrate (TRICOR) 145 MG tablet    Sig: Take 1 tablet (145 mg total) by mouth daily.    Dispense:  90 tablet    Refill:  1    $    Order Specific Question:   Supervising Provider    Answer:   Janora Norlander [[id number]]  . gabapentin (NEURONTIN) 300 MG capsule    Sig: Take 1-2 capsules (300-600 mg total) by mouth at bedtime.    Dispense:  180 capsule    Refill:  3    Order Specific Question:   Supervising  Provider    Answer:   Janora Norlander [[id number]]  . atorvastatin (LIPITOR) 40 MG tablet    Sig: Take 1 tablet (40 mg total) by mouth every evening.    Dispense:  90 tablet    Refill:  1    Order Specific Question:   Supervising Provider    Answer:   Janora Norlander [[id number]]  . ALPRAZolam (XANAX) 0.5 MG tablet    Sig: Take 1 tablet (0.5 mg total)  by mouth 2 (two) times daily as needed. For anxiety    Dispense:  30 tablet    Refill:  0    Order Specific Question:   Supervising Provider    Answer:   Janora Norlander [[id number]]    Particia Nearing PA-C [facility] [phone]

## 2018-07-20 ENCOUNTER — Other Ambulatory Visit: Payer: Self-pay | Admitting: Physician Assistant

## 2018-07-20 ENCOUNTER — Telehealth: Payer: Self-pay

## 2018-07-20 ENCOUNTER — Encounter: Payer: Self-pay | Admitting: Physician Assistant

## 2018-07-20 MED ORDER — MELOXICAM 7.5 MG PO TABS: 7.5000 mg | ORAL_TABLET | Freq: Every day | ORAL | 5 refills | Status: DC

## 2018-07-20 NOTE — Telephone Encounter (Signed)
Patient's Medicaid will not pay for Nabumetone.  Alternatives are:  Ibuprofen tablet Indomethacin capsule Ketorolac tablet Meloxicam tablet Naproxen EC tablet Naproxen tablet Sulindac tablet

## 2018-07-20 NOTE — Telephone Encounter (Signed)
Sent mobic 

## 2018-07-27 ENCOUNTER — Other Ambulatory Visit: Payer: Medicaid Other

## 2018-07-27 ENCOUNTER — Other Ambulatory Visit: Payer: Self-pay

## 2018-07-27 DIAGNOSIS — E1159 Type 2 diabetes mellitus with other circulatory complications: Secondary | ICD-10-CM

## 2018-07-27 DIAGNOSIS — E1140 Type 2 diabetes mellitus with diabetic neuropathy, unspecified: Secondary | ICD-10-CM

## 2018-07-27 LAB — BAYER DCA HB A1C WAIVED: HB A1C (BAYER DCA - WAIVED): 7.5 % — ABNORMAL HIGH (ref <7.0)

## 2018-07-28 LAB — CBC WITH DIFFERENTIAL/PLATELET
Basophils Absolute: 0.1 10*3/uL (ref 0.0–0.2)
Basos: 1 %
EOS (ABSOLUTE): 0.1 10*3/uL (ref 0.0–0.4)
Eos: 1 %
Hematocrit: 39.6 % (ref 34.0–46.6)
Hemoglobin: 13.5 g/dL (ref 11.1–15.9)
Immature Grans (Abs): 0 10*3/uL (ref 0.0–0.1)
Immature Granulocytes: 0 %
Lymphocytes Absolute: 2.3 10*3/uL (ref 0.7–3.1)
Lymphs: 25 %
MCH: 31.9 pg (ref 26.6–33.0)
MCHC: 34.1 g/dL (ref 31.5–35.7)
MCV: 94 fL (ref 79–97)
Monocytes Absolute: 0.3 10*3/uL (ref 0.1–0.9)
Monocytes: 3 %
Neutrophils Absolute: 6.4 10*3/uL (ref 1.4–7.0)
Neutrophils: 70 %
Platelets: 306 10*3/uL (ref 150–450)
RBC: 4.23 x10E6/uL (ref 3.77–5.28)
RDW: 14.4 % (ref 11.7–15.4)
WBC: 9.2 10*3/uL (ref 3.4–10.8)

## 2018-07-28 LAB — CMP14+EGFR
ALT: 17 IU/L (ref 0–32)
AST: 19 IU/L (ref 0–40)
Albumin/Globulin Ratio: 1.8 (ref 1.2–2.2)
Albumin: 4.4 g/dL (ref 3.8–4.9)
Alkaline Phosphatase: 86 IU/L (ref 39–117)
BUN/Creatinine Ratio: 9 (ref 9–23)
BUN: 6 mg/dL (ref 6–24)
Bilirubin Total: 0.2 mg/dL (ref 0.0–1.2)
CO2: 22 mmol/L (ref 20–29)
Calcium: 9.3 mg/dL (ref 8.7–10.2)
Chloride: 94 mmol/L — ABNORMAL LOW (ref 96–106)
Creatinine, Ser: 0.68 mg/dL (ref 0.57–1.00)
GFR calc Af Amer: 117 mL/min/{1.73_m2} (ref >59)
GFR calc non Af Amer: 102 mL/min/{1.73_m2} (ref >59)
Globulin, Total: 2.5 g/dL (ref 1.5–4.5)
Glucose: 249 mg/dL — ABNORMAL HIGH (ref 65–99)
Potassium: 4.3 mmol/L (ref 3.5–5.2)
Sodium: 133 mmol/L — ABNORMAL LOW (ref 134–144)
Total Protein: 6.9 g/dL (ref 6.0–8.5)

## 2018-07-28 LAB — LIPID PANEL
Chol/HDL Ratio: 8 ratio — ABNORMAL HIGH (ref 0.0–4.4)
Cholesterol, Total: 199 mg/dL (ref 100–199)
HDL: 25 mg/dL — ABNORMAL LOW (ref >39)
Triglycerides: 418 mg/dL — ABNORMAL HIGH (ref 0–149)

## 2018-07-28 LAB — TSH: TSH: 0.9 u[IU]/mL (ref 0.450–4.500)

## 2018-07-29 ENCOUNTER — Telehealth: Payer: Self-pay | Admitting: *Deleted

## 2018-07-29 NOTE — Telephone Encounter (Signed)
Penny Moreno, the patient is unable to take Mobic, can you choose another one please.

## 2018-07-29 NOTE — Telephone Encounter (Signed)
Pt states she can not take Mobic Pt is requesting RX for Relafen Please advise

## 2018-07-29 NOTE — Telephone Encounter (Signed)
Pt notified of results Verbalizes understanding 

## 2018-07-29 NOTE — Telephone Encounter (Signed)
I tried to send in Relafen, it is not covered on her insurance.

## 2018-07-29 NOTE — Telephone Encounter (Signed)
-----   Message from Terald Sleeper, [place] sent at 07/29/2018  2:04 PM EDT ----- At this time your thyroid is good and stable can be rechecked in 1 year. Your cholesterol shows your triglycerides to be a lot higher.  Have you been taking your fenofibrate?  Reduce carbs and sugar in your diet and plan to recheck this in 3 months The comprehensive metabolic shows your kidney and liver to be okay your sugar is quite elevated.  And your A1c has gone up to 7.5 which is more elevated than you have had in the past couple years.  What is going on with your diabetes medications?  This will need to be rechecked in 3 months Your CBC is normal can be rechecked in 1 year.

## 2018-08-02 NOTE — Telephone Encounter (Signed)
Pt notified Relafen not covered by insurance

## 2018-08-09 ENCOUNTER — Other Ambulatory Visit: Payer: Self-pay | Admitting: Family Medicine

## 2018-09-13 ENCOUNTER — Other Ambulatory Visit: Payer: Self-pay

## 2018-09-15 ENCOUNTER — Encounter: Payer: Self-pay | Admitting: Physician Assistant

## 2018-09-15 ENCOUNTER — Other Ambulatory Visit: Payer: Self-pay | Admitting: Physician Assistant

## 2018-09-15 ENCOUNTER — Ambulatory Visit (INDEPENDENT_AMBULATORY_CARE_PROVIDER_SITE_OTHER): Payer: Medicaid Other | Admitting: Physician Assistant

## 2018-09-15 DIAGNOSIS — M542 Cervicalgia: Secondary | ICD-10-CM

## 2018-09-15 MED ORDER — NABUMETONE 750 MG PO TABS: 750.0000 mg | ORAL_TABLET | Freq: Every day | ORAL | 5 refills | Status: DC

## 2018-09-15 MED ORDER — FLUCONAZOLE 150 MG PO TABS: 150.0000 mg | ORAL_TABLET | Freq: Every day | ORAL | 2 refills | Status: DC

## 2018-09-18 NOTE — Progress Notes (Signed)
Telephone visit  Subjective: OX:BDZH pain PCP: Penny Moreno, Moreno GDJ:[id number] [name] is a 53 y.o. female calls for telephone consult today. Patient provides verbal consent for consult held via phone.  Patient is identified with 2 separate identifiers.  At this time the entire area is on COVID-19 social distancing and stay home orders are in place.  Patient is of higher risk and therefore we are performing this by a virtual method.  Location of patient: home Location of provider: [facility] Others present for call: no  This patient is having a follow-up on her chronic conditions of cervical pain with radiculopathy.  She will come in soon for cervical x-ray.  She states it has been many years since she has had it done.  She is on gabapentin 300 mg 3 times a day.  She states that she can tell little bit of difference.  She also has Flexeril to use when she is very tight.  However she gets very sleepy with this medication. The current anti-inflammatory we have been using was Mobic, which is something her insurance was covering.  However she has taken Relafen in the past and done well.  We will try this medication again.  ROS: Per HPI  Allergies  Allergen Reactions  . Tetracyclines & Related Nausea And Vomiting  . Latex     Irritates skin   . Sulfa Antibiotics Nausea And Vomiting   Past Medical History:  Diagnosis Date  . Anxiety   . Arthritis   . Back pain   . Bipolar disorder ([place])   . Depression   . Eating disorder   . Frozen shoulder   . GERD (gastroesophageal reflux disease)   . Leukocytosis 07/02/2015  . Neuromuscular disorder (HCC)    Fibromyalgia  . OCD (obsessive compulsive disorder)   . Panic disorder   . Vaginal Pap smear, abnormal   . Vocal cord polyps     Current Outpatient Medications:  .  ALPRAZolam (XANAX) 0.5 MG tablet, Take 1 tablet (0.5 mg total) by mouth 2 (two) times daily as needed. For anxiety, Disp: 30 tablet, Rfl: 0 .  atorvastatin (LIPITOR) 40  MG tablet, Take 1 tablet (40 mg total) by mouth every evening., Disp: 90 tablet, Rfl: 1 .  cholecalciferol (VITAMIN D) 1000 units tablet, Take 1,000 Units by mouth daily., Disp: , Rfl:  .  cyclobenzaprine (FLEXERIL) 10 MG tablet, TAKE 1 TABLET THREE TIMES DAILY AS NEEDED FOR MUSCLE SPASM, Disp: 90 tablet, Rfl: 5 .  fenofibrate (TRICOR) 145 MG tablet, Take 1 tablet (145 mg total) by mouth daily., Disp: 90 tablet, Rfl: 1 .  fluconazole (DIFLUCAN) 150 MG tablet, Take 1 tablet (150 mg total) by mouth daily., Disp: 1 tablet, Rfl: 2 .  FLUoxetine (PROZAC) 40 MG capsule, Take 1 capsule (40 mg total) by mouth daily., Disp: 90 capsule, Rfl: 1 .  fluticasone (FLONASE) 50 MCG/ACT nasal spray, 1 SPRAY IN EACH NOSTRIL 2 TIMES A DAY AS NEEDED FOR ALLERGY OR RHINITIS, Disp: 16 g, Rfl: 2 .  furosemide (LASIX) 20 MG tablet, TAKE 1 TABLET (20 MG TOTAL) BY MOUTH DAILY AS NEEDED., Disp: 30 tablet, Rfl: 3 .  gabapentin (NEURONTIN) 300 MG capsule, Take 1-2 capsules (300-600 mg total) by mouth at bedtime., Disp: 180 capsule, Rfl: 3 .  LATUDA 40 MG TABS tablet, TAKE 1 TABLET WITH BREAKFAST, Disp: 30 tablet, Rfl: 0 .  nabumetone (RELAFEN) 750 MG tablet, Take 1 tablet (750 mg total) by mouth daily. Failed mobic,  Disp: 30 tablet, Rfl: 5 .  omeprazole (PRILOSEC) 20 MG capsule, Take 2 capsules (40 mg total) by mouth daily., Disp: 180 capsule, Rfl: 3 .  Probiotic Product (PROBIOTIC PEARLS PO), Take 1 capsule by mouth daily., Disp: , Rfl:   Assessment/ Plan: 53 y.o. female   1. Cervical pain - nabumetone (RELAFEN) 750 MG tablet; Take 1 tablet (750 mg total) by mouth daily. Failed mobic  Dispense: 30 tablet; Refill: 5 - DG Cervical Spine Complete; Future   No follow-ups on file.  Continue all other maintenance medications as listed above.  Start time: 2:53 PM End time: 3:07 PM  Meds ordered this encounter  Medications  . fluconazole (DIFLUCAN) 150 MG tablet    Sig: Take 1 tablet (150 mg total) by mouth daily.     Dispense:  1 tablet    Refill:  2    Order Specific Question:   Supervising Provider    Answer:   Penny Moreno  . nabumetone (RELAFEN) 750 MG tablet    Sig: Take 1 tablet (750 mg total) by mouth daily. Failed mobic    Dispense:  30 tablet    Refill:  5    Failed mobic    Order Specific Question:   Supervising Provider    Answer:   Penny Moreno [[id number]]    Penny Moreno Penny Moreno [phone]

## 2018-10-04 ENCOUNTER — Other Ambulatory Visit: Payer: Self-pay

## 2018-10-11 ENCOUNTER — Other Ambulatory Visit: Payer: Self-pay

## 2018-10-11 ENCOUNTER — Ambulatory Visit (INDEPENDENT_AMBULATORY_CARE_PROVIDER_SITE_OTHER): Payer: Medicaid Other | Admitting: *Deleted

## 2018-10-11 ENCOUNTER — Ambulatory Visit (INDEPENDENT_AMBULATORY_CARE_PROVIDER_SITE_OTHER): Payer: Medicaid Other

## 2018-10-11 DIAGNOSIS — M542 Cervicalgia: Secondary | ICD-10-CM

## 2018-10-11 DIAGNOSIS — Z23 Encounter for immunization: Secondary | ICD-10-CM | POA: Diagnosis not present

## 2018-10-26 ENCOUNTER — Ambulatory Visit (INDEPENDENT_AMBULATORY_CARE_PROVIDER_SITE_OTHER): Payer: Medicaid Other | Admitting: Physician Assistant

## 2018-10-26 ENCOUNTER — Encounter: Payer: Self-pay | Admitting: Physician Assistant

## 2018-10-26 DIAGNOSIS — F331 Major depressive disorder, recurrent, moderate: Secondary | ICD-10-CM | POA: Diagnosis not present

## 2018-10-26 DIAGNOSIS — N3000 Acute cystitis without hematuria: Secondary | ICD-10-CM | POA: Diagnosis not present

## 2018-10-26 DIAGNOSIS — M150 Primary generalized (osteo)arthritis: Secondary | ICD-10-CM

## 2018-10-26 DIAGNOSIS — E1159 Type 2 diabetes mellitus with other circulatory complications: Secondary | ICD-10-CM | POA: Diagnosis not present

## 2018-10-26 DIAGNOSIS — E1140 Type 2 diabetes mellitus with diabetic neuropathy, unspecified: Secondary | ICD-10-CM | POA: Diagnosis not present

## 2018-10-26 DIAGNOSIS — I152 Hypertension secondary to endocrine disorders: Secondary | ICD-10-CM

## 2018-10-26 DIAGNOSIS — M159 Polyosteoarthritis, unspecified: Secondary | ICD-10-CM

## 2018-10-26 DIAGNOSIS — I10 Essential (primary) hypertension: Secondary | ICD-10-CM

## 2018-10-26 DIAGNOSIS — M9901 Segmental and somatic dysfunction of cervical region: Secondary | ICD-10-CM

## 2018-10-26 NOTE — Progress Notes (Signed)
Telephone visit  Subjective: UR:KYHCWCB chronic conditions PCP: Terald Sleeper, PA-C JSE:[id number] C Penny Moreno is a 53 y.o. female calls for telephone consult today. Patient provides verbal consent for consult held via phone.  Patient is identified with 2 separate identifiers.  At this time the entire area is on COVID-19 social distancing and stay home orders are in place.  Patient is of higher risk and therefore we are performing this by a virtual method.  Location of patient: home Location of provider: HOME Others present for call: no  Patient is having a follow-up on her chronic medical conditions which do include type 2 diabetes controlled, osteoarthritis in multiple joints, depression, cervical pain of the neck that does radiate. The patient reports that she feels like she is doing better.  She thinks that her depression has made a little bit of a turn.  She does need to have some refills on her medication.  She does have a lot of pain that went from the neck down into the thoracic area it did have a lot of a flareup one night.  She does have to use muscle relaxant at home.  She does not know of any specific injury.  Depression screen [facility] 2/9 10/26/2018 03/22/2018 12/22/2017 08/09/2017 07/28/2017  Decreased Interest 2 2 2 2 2   Down, Depressed, Hopeless 0 1 2 2 2   PHQ - 2 Score 2 3 4 4 4   Altered sleeping 1 0 0 0 2  Tired, decreased energy 1 1 1 1 1   Change in appetite 0 0 0 0 0  Feeling bad or failure about yourself  1 1 1 1 1   Trouble concentrating 1 3 0 0 1  Moving slowly or fidgety/restless 0 0 0 0 0  Suicidal thoughts 0 0 0 0 0  PHQ-9 Score 6 8 6 6 9   Difficult doing work/chores - - - Not difficult at all Somewhat difficult  Some recent data might be hidden      ROS: Per HPI  Allergies  Allergen Reactions  . Tetracyclines & Related Nausea And Vomiting  . Latex     Irritates skin   . Sulfa Antibiotics Nausea And Vomiting   Past Medical History:  Diagnosis Date  .  Anxiety   . Arthritis   . Back pain   . Bipolar disorder ([place])   . Depression   . Eating disorder   . Frozen shoulder   . GERD (gastroesophageal reflux disease)   . Leukocytosis 07/02/2015  . Neuromuscular disorder (HCC)    Fibromyalgia  . OCD (obsessive compulsive disorder)   . Panic disorder   . Vaginal Pap smear, abnormal   . Vocal cord polyps     Current Outpatient Medications:  .  ALPRAZolam (XANAX) 0.5 MG tablet, Take 1 tablet (0.5 mg total) by mouth 2 (two) times daily as needed. For anxiety, Disp: 30 tablet, Rfl: 0 .  atorvastatin (LIPITOR) 40 MG tablet, Take 1 tablet (40 mg total) by mouth every evening., Disp: 90 tablet, Rfl: 1 .  cholecalciferol (VITAMIN D) 1000 units tablet, Take 1,000 Units by mouth daily., Disp: , Rfl:  .  cyclobenzaprine (FLEXERIL) 10 MG tablet, TAKE 1 TABLET THREE TIMES DAILY AS NEEDED FOR MUSCLE SPASM, Disp: 90 tablet, Rfl: 5 .  fenofibrate (TRICOR) 145 MG tablet, Take 1 tablet (145 mg total) by mouth daily., Disp: 90 tablet, Rfl: 1 .  fluconazole (DIFLUCAN) 150 MG tablet, Take 1 tablet (150 mg total) by mouth daily., Disp: 1  tablet, Rfl: 2 .  FLUoxetine (PROZAC) 40 MG capsule, Take 1 capsule (40 mg total) by mouth daily., Disp: 90 capsule, Rfl: 1 .  fluticasone (FLONASE) 50 MCG/ACT nasal spray, 1 SPRAY IN EACH NOSTRIL 2 TIMES A DAY AS NEEDED FOR ALLERGY OR RHINITIS, Disp: 16 g, Rfl: 2 .  furosemide (LASIX) 20 MG tablet, TAKE 1 TABLET (20 MG TOTAL) BY MOUTH DAILY AS NEEDED., Disp: 30 tablet, Rfl: 3 .  gabapentin (NEURONTIN) 300 MG capsule, Take 1-2 capsules (300-600 mg total) by mouth at bedtime., Disp: 180 capsule, Rfl: 3 .  LATUDA 40 MG TABS tablet, TAKE 1 TABLET WITH BREAKFAST, Disp: 30 tablet, Rfl: 0 .  nabumetone (RELAFEN) 750 MG tablet, Take 1 tablet (750 mg total) by mouth daily. Failed mobic, Disp: 30 tablet, Rfl: 5 .  omeprazole (PRILOSEC) 20 MG capsule, Take 2 capsules (40 mg total) by mouth daily., Disp: 180 capsule, Rfl: 3 .  Probiotic  Product (PROBIOTIC PEARLS PO), Take 1 capsule by mouth daily., Disp: , Rfl:   Assessment/ Plan: 53 y.o. female   1. Hypertension associated with diabetes ([place]) - CMP14+EGFR; Future - Lipid panel; Future - Microalbumin / creatinine urine ratio; Future - CBC with Differential/Platelet; Future  2. Type 2 diabetes mellitus with diabetic neuropathy, without long-term current use of insulin (HCC) - CMP14+EGFR; Future - Lipid panel; Future - Microalbumin / creatinine urine ratio; Future - CBC with Differential/Platelet; Future - Bayer DCA Hb A1c Waived; Future  3. Acute cystitis without hematuria - Urine Culture; Future - Urinalysis, Complete; Future   Return in about 3 months (around 01/25/2019).  Continue all other maintenance medications as listed above.  Start time: 1:53 PM End time: 2:08 PM  No orders of the defined types were placed in this encounter.   Particia Nearing PA-C [place] [phone]

## 2018-11-07 ENCOUNTER — Other Ambulatory Visit: Payer: Self-pay

## 2018-11-07 ENCOUNTER — Other Ambulatory Visit: Payer: Medicaid Other

## 2018-11-07 DIAGNOSIS — N3000 Acute cystitis without hematuria: Secondary | ICD-10-CM

## 2018-11-07 DIAGNOSIS — E1159 Type 2 diabetes mellitus with other circulatory complications: Secondary | ICD-10-CM

## 2018-11-07 DIAGNOSIS — E1140 Type 2 diabetes mellitus with diabetic neuropathy, unspecified: Secondary | ICD-10-CM

## 2018-11-07 DIAGNOSIS — I152 Hypertension secondary to endocrine disorders: Secondary | ICD-10-CM

## 2018-11-07 LAB — URINALYSIS, COMPLETE
Bilirubin, UA: NEGATIVE
Ketones, UA: NEGATIVE
Leukocytes,UA: NEGATIVE
Nitrite, UA: NEGATIVE
Protein,UA: NEGATIVE
RBC, UA: NEGATIVE
Specific Gravity, UA: 1.02 (ref 1.005–1.030)
Urobilinogen, Ur: 0.2 mg/dL (ref 0.2–1.0)
pH, UA: 7.5 (ref 5.0–7.5)

## 2018-11-07 LAB — BAYER DCA HB A1C WAIVED: HB A1C (BAYER DCA - WAIVED): 10.6 % — ABNORMAL HIGH (ref <7.0)

## 2018-11-07 LAB — MICROSCOPIC EXAMINATION: Renal Epithel, UA: NONE SEEN /hpf

## 2018-11-08 LAB — LIPID PANEL
Chol/HDL Ratio: 7.1 ratio — ABNORMAL HIGH (ref 0.0–4.4)
Cholesterol, Total: 164 mg/dL (ref 100–199)
HDL: 23 mg/dL — ABNORMAL LOW (ref >39)
LDL Chol Calc (NIH): 70 mg/dL (ref 0–99)
Triglycerides: 448 mg/dL — ABNORMAL HIGH (ref 0–149)
VLDL Cholesterol Cal: 71 mg/dL — ABNORMAL HIGH (ref 5–40)

## 2018-11-08 LAB — CBC WITH DIFFERENTIAL/PLATELET
Basophils Absolute: 0.1 10*3/uL (ref 0.0–0.2)
Basos: 1 %
EOS (ABSOLUTE): 0.1 10*3/uL (ref 0.0–0.4)
Eos: 1 %
Hematocrit: 42.8 % (ref 34.0–46.6)
Hemoglobin: 14.6 g/dL (ref 11.1–15.9)
Immature Grans (Abs): 0 10*3/uL (ref 0.0–0.1)
Immature Granulocytes: 0 %
Lymphocytes Absolute: 2.3 10*3/uL (ref 0.7–3.1)
Lymphs: 26 %
MCH: 34 pg — ABNORMAL HIGH (ref 26.6–33.0)
MCHC: 34.1 g/dL (ref 31.5–35.7)
MCV: 100 fL — ABNORMAL HIGH (ref 79–97)
Monocytes Absolute: 0.3 10*3/uL (ref 0.1–0.9)
Monocytes: 3 %
Neutrophils Absolute: 6.3 10*3/uL (ref 1.4–7.0)
Neutrophils: 69 %
Platelets: 291 10*3/uL (ref 150–450)
RBC: 4.3 x10E6/uL (ref 3.77–5.28)
RDW: 14.6 % (ref 11.7–15.4)
WBC: 9.1 10*3/uL (ref 3.4–10.8)

## 2018-11-08 LAB — CMP14+EGFR
ALT: 16 IU/L (ref 0–32)
AST: 14 IU/L (ref 0–40)
Albumin/Globulin Ratio: 2 (ref 1.2–2.2)
Albumin: 4.3 g/dL (ref 3.8–4.9)
Alkaline Phosphatase: 90 IU/L (ref 39–117)
BUN/Creatinine Ratio: 8 — ABNORMAL LOW (ref 9–23)
BUN: 6 mg/dL (ref 6–24)
Bilirubin Total: 0.2 mg/dL (ref 0.0–1.2)
CO2: 24 mmol/L (ref 20–29)
Calcium: 9.1 mg/dL (ref 8.7–10.2)
Chloride: 97 mmol/L (ref 96–106)
Creatinine, Ser: 0.76 mg/dL (ref 0.57–1.00)
GFR calc Af Amer: 105 mL/min/{1.73_m2} (ref >59)
GFR calc non Af Amer: 91 mL/min/{1.73_m2} (ref >59)
Globulin, Total: 2.1 g/dL (ref 1.5–4.5)
Glucose: 340 mg/dL — ABNORMAL HIGH (ref 65–99)
Potassium: 4 mmol/L (ref 3.5–5.2)
Sodium: 137 mmol/L (ref 134–144)
Total Protein: 6.4 g/dL (ref 6.0–8.5)

## 2018-11-08 LAB — MICROALBUMIN / CREATININE URINE RATIO
Creatinine, Urine: 64.9 mg/dL
Microalb/Creat Ratio: 22 mg/g creat (ref 0–29)
Microalbumin, Urine: 14.1 ug/mL

## 2018-11-09 ENCOUNTER — Other Ambulatory Visit: Payer: Self-pay | Admitting: Physician Assistant

## 2018-11-09 LAB — URINE CULTURE

## 2018-11-09 MED ORDER — FLUCONAZOLE 150 MG PO TABS: ORAL_TABLET | ORAL | 0 refills | Status: DC

## 2018-11-09 MED ORDER — PIOGLITAZONE HCL 30 MG PO TABS: 30.0000 mg | ORAL_TABLET | Freq: Every day | ORAL | 3 refills | Status: DC

## 2018-11-11 ENCOUNTER — Encounter: Payer: Self-pay | Admitting: Family Medicine

## 2018-11-11 ENCOUNTER — Other Ambulatory Visit: Payer: Self-pay

## 2018-11-11 ENCOUNTER — Ambulatory Visit (INDEPENDENT_AMBULATORY_CARE_PROVIDER_SITE_OTHER): Payer: Medicaid Other | Admitting: Family Medicine

## 2018-11-11 DIAGNOSIS — J019 Acute sinusitis, unspecified: Secondary | ICD-10-CM

## 2018-11-11 MED ORDER — PREDNISONE 10 MG (21) PO TBPK: ORAL_TABLET | ORAL | 0 refills | Status: DC

## 2018-11-11 MED ORDER — AMOXICILLIN-POT CLAVULANATE 875-125 MG PO TABS: 1.0000 | ORAL_TABLET | Freq: Two times a day (BID) | ORAL | 0 refills | Status: AC

## 2018-11-11 NOTE — Progress Notes (Signed)
Virtual Visit via Telephone Note  I connected with Penny Moreno on 11/11/18 at 3:17 PM by telephone and verified that I am speaking with the correct person using two identifiers. Penny Moreno is currently located at home and nobody is currently with her during this visit. The provider, Loman Brooklyn, FNP is located in their home at time of visit.  I discussed the limitations, risks, security and privacy concerns of performing an evaluation and management service by telephone and the availability of in person appointments. I also discussed with the patient that there may be a patient responsible charge related to this service. The patient expressed understanding and agreed to proceed.  Subjective: PCP: Terald Sleeper, PA-C  Chief Complaint  Patient presents with  . Sinusitis   Patient complains of cough, head congestion, headache, sore throat, facial pain/pressure, postnasal drainage and loss of taste. Onset of symptoms was 2 days ago, gradually worsening since that time. She is drinking plenty of fluids. Evaluation to date: none. Treatment to date: nasal steroids. She has a history of allergies. She does smoke. Patient has not had known recent close contact with someone who has tested positive for COVID-19.    ROS: Per HPI  Current Outpatient Medications:  .  ALPRAZolam (XANAX) 0.5 MG tablet, Take 1 tablet (0.5 mg total) by mouth 2 (two) times daily as needed. For anxiety, Disp: 30 tablet, Rfl: 0 .  atorvastatin (LIPITOR) 40 MG tablet, Take 1 tablet (40 mg total) by mouth every evening., Disp: 90 tablet, Rfl: 1 .  cholecalciferol (VITAMIN D) 1000 units tablet, Take 1,000 Units by mouth daily., Disp: , Rfl:  .  cyclobenzaprine (FLEXERIL) 10 MG tablet, TAKE 1 TABLET THREE TIMES DAILY AS NEEDED FOR MUSCLE SPASM, Disp: 90 tablet, Rfl: 5 .  fenofibrate (TRICOR) 145 MG tablet, Take 1 tablet (145 mg total) by mouth daily., Disp: 90 tablet, Rfl: 1 .  fluconazole (DIFLUCAN) 150 MG  tablet, 1 po q week x 4 weeks, Disp: 4 tablet, Rfl: 0 .  FLUoxetine (PROZAC) 40 MG capsule, Take 1 capsule (40 mg total) by mouth daily., Disp: 90 capsule, Rfl: 1 .  fluticasone (FLONASE) 50 MCG/ACT nasal spray, 1 SPRAY IN EACH NOSTRIL 2 TIMES A DAY AS NEEDED FOR ALLERGY OR RHINITIS, Disp: 16 g, Rfl: 2 .  furosemide (LASIX) 20 MG tablet, TAKE 1 TABLET (20 MG TOTAL) BY MOUTH DAILY AS NEEDED., Disp: 30 tablet, Rfl: 3 .  gabapentin (NEURONTIN) 300 MG capsule, Take 1-2 capsules (300-600 mg total) by mouth at bedtime., Disp: 180 capsule, Rfl: 3 .  LATUDA 40 MG TABS tablet, TAKE 1 TABLET WITH BREAKFAST, Disp: 30 tablet, Rfl: 0 .  nabumetone (RELAFEN) 750 MG tablet, Take 1 tablet (750 mg total) by mouth daily. Failed mobic, Disp: 30 tablet, Rfl: 5 .  omeprazole (PRILOSEC) 20 MG capsule, Take 2 capsules (40 mg total) by mouth daily., Disp: 180 capsule, Rfl: 3 .  pioglitazone (ACTOS) 30 MG tablet, Take 1 tablet (30 mg total) by mouth daily., Disp: 30 tablet, Rfl: 3 .  Probiotic Product (PROBIOTIC PEARLS PO), Take 1 capsule by mouth daily., Disp: , Rfl:   Allergies  Allergen Reactions  . Tetracyclines & Related Nausea And Vomiting  . Latex     Irritates skin   . Sulfa Antibiotics Nausea And Vomiting   Past Medical History:  Diagnosis Date  . Anxiety   . Arthritis   . Back pain   . Bipolar disorder ([place])   .  Depression   . Eating disorder   . Frozen shoulder   . GERD (gastroesophageal reflux disease)   . Leukocytosis 07/02/2015  . Neuromuscular disorder (HCC)    Fibromyalgia  . OCD (obsessive compulsive disorder)   . Panic disorder   . Vaginal Pap smear, abnormal   . Vocal cord polyps     Observations/Objective: A&O  No respiratory distress or wheezing audible over the phone Mood, judgement, and thought processes all WNL  Assessment and Plan: 1. Acute non-recurrent sinusitis, unspecified location - Education provided on sinusitis. Symptom management. Adequate hydration. Encouraged  patient to go for COVID testing.  - amoxicillin-clavulanate (AUGMENTIN) 875-125 MG tablet; Take 1 tablet by mouth 2 (two) times daily for 7 days.  Dispense: 14 tablet; Refill: 0 - predniSONE (STERAPRED UNI-PAK 21 TAB) 10 MG (21) TBPK tablet; Use as directed on back of pill pack  Dispense: 21 tablet; Refill: 0   Follow Up Instructions:  I discussed the assessment and treatment plan with the patient. The patient was provided an opportunity to ask questions and all were answered. The patient agreed with the plan and demonstrated an understanding of the instructions.   The patient was advised to call back or seek an in-person evaluation if the symptoms worsen or if the condition fails to improve as anticipated.  The above assessment and management plan was discussed with the patient. The patient verbalized understanding of and has agreed to the management plan. Patient is aware to call the clinic if symptoms persist or worsen. Patient is aware when to return to the clinic for a follow-up visit. Patient educated on when it is appropriate to go to the emergency department.   Time call ended: 3:27 PM  I provided 12 minutes of non-face-to-face time during this encounter.  Hendricks Limes, MSN, APRN, FNP-C [facility] 11/11/18

## 2018-11-11 NOTE — Patient Instructions (Signed)

## 2018-12-16 ENCOUNTER — Other Ambulatory Visit: Payer: Self-pay | Admitting: Physician Assistant

## 2018-12-16 DIAGNOSIS — F331 Major depressive disorder, recurrent, moderate: Secondary | ICD-10-CM

## 2018-12-21 ENCOUNTER — Ambulatory Visit (INDEPENDENT_AMBULATORY_CARE_PROVIDER_SITE_OTHER): Payer: Medicaid Other | Admitting: Physician Assistant

## 2018-12-21 DIAGNOSIS — F331 Major depressive disorder, recurrent, moderate: Secondary | ICD-10-CM | POA: Diagnosis not present

## 2018-12-21 DIAGNOSIS — F411 Generalized anxiety disorder: Secondary | ICD-10-CM

## 2018-12-21 DIAGNOSIS — K219 Gastro-esophageal reflux disease without esophagitis: Secondary | ICD-10-CM

## 2018-12-21 DIAGNOSIS — I10 Essential (primary) hypertension: Secondary | ICD-10-CM

## 2018-12-21 DIAGNOSIS — E1140 Type 2 diabetes mellitus with diabetic neuropathy, unspecified: Secondary | ICD-10-CM

## 2018-12-21 DIAGNOSIS — E1159 Type 2 diabetes mellitus with other circulatory complications: Secondary | ICD-10-CM

## 2018-12-21 MED ORDER — ATORVASTATIN CALCIUM 40 MG PO TABS: 40.0000 mg | ORAL_TABLET | Freq: Every evening | ORAL | 1 refills | Status: DC

## 2018-12-21 MED ORDER — ALPRAZOLAM 0.5 MG PO TABS: 0.5000 mg | ORAL_TABLET | Freq: Three times a day (TID) | ORAL | 0 refills | Status: DC | PRN

## 2018-12-21 MED ORDER — FLUOXETINE HCL 40 MG PO CAPS: 40.0000 mg | ORAL_CAPSULE | Freq: Every day | ORAL | 1 refills | Status: DC

## 2018-12-21 MED ORDER — FLUTICASONE PROPIONATE 50 MCG/ACT NA SUSP: NASAL | 2 refills | Status: DC

## 2018-12-21 MED ORDER — LURASIDONE HCL 40 MG PO TABS: ORAL_TABLET | ORAL | 11 refills | Status: DC

## 2018-12-21 MED ORDER — OMEPRAZOLE 20 MG PO CPDR: 40.0000 mg | DELAYED_RELEASE_CAPSULE | Freq: Every day | ORAL | 3 refills | Status: DC

## 2018-12-27 ENCOUNTER — Encounter: Payer: Self-pay | Admitting: Physician Assistant

## 2018-12-27 NOTE — Progress Notes (Signed)
Telephone visit  Subjective: IO:[id number] for condition PCP: Terald Sleeper, PA-Penny UXL:[id number] Penny Moreno is a 53 y.o. female calls for telephone consult today. Patient provides verbal consent for consult held via phone.  Patient is identified with 2 separate identifiers.  At this time the entire area is on COVID-19 social distancing and stay home orders are in place.  Patient is of higher risk and therefore we are performing this by a virtual method.  Location of patient: Home Location of provider: HOME Others present for call: no  Patient is having a phone visit for chronic recheck on her medical conditions that do include hypertension, GERD, type 2 diabetes, depression, anxiety.  The patient states that she will be moving to [place] in the new year.  She does need some refills on her medications at this point and I have encouraged her to come have her labs performed 1 time before she leaves because she has a hard time finding a new PCP.  She states that her sugars have been doing a lot better.  She has been trying to watch her diet very regularly.  She denies any difficulty with chest pain, shortness of breath.  She states that her anxiety and depression are about the same.  She is under a lot of stress time to move.  And she is just worried about a lot of future things that are going on.   ROS: Per HPI  Allergies  Allergen Reactions  . Tetracyclines & Related Nausea And Vomiting  . Latex     Irritates skin   . Sulfa Antibiotics Nausea And Vomiting   Past Medical History:  Diagnosis Date  . Anxiety   . Arthritis   . Back pain   . Bipolar disorder ([place])   . Depression   . Eating disorder   . Frozen shoulder   . GERD (gastroesophageal reflux disease)   . Leukocytosis 07/02/2015  . Neuromuscular disorder (HCC)    Fibromyalgia  . OCD (obsessive compulsive disorder)   . Panic disorder   . Vaginal Pap smear, abnormal   . Vocal cord polyps     Current Outpatient  Medications:  .  ALPRAZolam (XANAX) 0.5 MG tablet, Take 1 tablet (0.5 mg total) by mouth 3 (three) times daily as needed. For anxiety, Disp: 90 tablet, Rfl: 0 .  atorvastatin (LIPITOR) 40 MG tablet, Take 1 tablet (40 mg total) by mouth every evening., Disp: 90 tablet, Rfl: 1 .  cholecalciferol (VITAMIN D) 1000 units tablet, Take 1,000 Units by mouth daily., Disp: , Rfl:  .  cyclobenzaprine (FLEXERIL) 10 MG tablet, TAKE 1 TABLET THREE TIMES DAILY AS NEEDED FOR MUSCLE SPASM, Disp: 90 tablet, Rfl: 5 .  fenofibrate (TRICOR) 145 MG tablet, Take 1 tablet (145 mg total) by mouth daily., Disp: 90 tablet, Rfl: 1 .  fluconazole (DIFLUCAN) 150 MG tablet, 1 po q week x 4 weeks, Disp: 4 tablet, Rfl: 0 .  FLUoxetine (PROZAC) 40 MG capsule, Take 1 capsule (40 mg total) by mouth daily., Disp: 90 capsule, Rfl: 1 .  fluticasone (FLONASE) 50 MCG/ACT nasal spray, 1 SPRAY IN EACH NOSTRIL 2 TIMES A DAY AS NEEDED FOR ALLERGY OR RHINITIS, Disp: 16 g, Rfl: 2 .  furosemide (LASIX) 20 MG tablet, TAKE 1 TABLET (20 MG TOTAL) BY MOUTH DAILY AS NEEDED., Disp: 30 tablet, Rfl: 3 .  gabapentin (NEURONTIN) 300 MG capsule, Take 1-2 capsules (300-600 mg total) by mouth at bedtime., Disp: 180 capsule, Rfl: 3 .  lurasidone (  LATUDA) 40 MG TABS tablet, TAKE 1 TABLET WITH BREAKFAST, Disp: 30 tablet, Rfl: 11 .  nabumetone (RELAFEN) 750 MG tablet, Take 1 tablet (750 mg total) by mouth daily. Failed mobic, Disp: 30 tablet, Rfl: 5 .  omeprazole (PRILOSEC) 20 MG capsule, Take 2 capsules (40 mg total) by mouth daily., Disp: 180 capsule, Rfl: 3 .  pioglitazone (ACTOS) 30 MG tablet, Take 1 tablet (30 mg total) by mouth daily., Disp: 30 tablet, Rfl: 3 .  predniSONE (STERAPRED UNI-PAK 21 TAB) 10 MG (21) TBPK tablet, Use as directed on back of pill pack, Disp: 21 tablet, Rfl: 0 .  Probiotic Product (PROBIOTIC PEARLS PO), Take 1 capsule by mouth daily., Disp: , Rfl:   Assessment/ Plan: 53 y.o. female   1. Moderate episode of recurrent major  depressive disorder (HCC) - ALPRAZolam (XANAX) 0.5 MG tablet; Take 1 tablet (0.5 mg total) by mouth 3 (three) times daily as needed. For anxiety  Dispense: 90 tablet; Refill: 0 - lurasidone (LATUDA) 40 MG TABS tablet; TAKE 1 TABLET WITH BREAKFAST  Dispense: 30 tablet; Refill: 11 - FLUoxetine (PROZAC) 40 MG capsule; Take 1 capsule (40 mg total) by mouth daily.  Dispense: 90 capsule; Refill: 1  2. Gastroesophageal reflux disease - omeprazole (PRILOSEC) 20 MG capsule; Take 2 capsules (40 mg total) by mouth daily.  Dispense: 180 capsule; Refill: 3  3. Hypertension associated with diabetes ([place]) - CBC with Differential/Platelet; Future - CMP14+EGFR; Future - Lipid Panel; Future - TSH; Future  4. Type 2 diabetes mellitus with diabetic neuropathy, without long-term current use of insulin (HCC) - atorvastatin (LIPITOR) 40 MG tablet; Take 1 tablet (40 mg total) by mouth every evening.  Dispense: 90 tablet; Refill: 1 - CBC with Differential/Platelet; Future - CMP14+EGFR; Future - Lipid Panel; Future - TSH; Future - hgba1c; Future  5. GAD (generalized anxiety disorder) - ALPRAZolam (XANAX) 0.5 MG tablet; Take 1 tablet (0.5 mg total) by mouth 3 (three) times daily as needed. For anxiety  Dispense: 90 tablet; Refill: 0   No follow-ups on file.  Continue all other maintenance medications as listed above.  Start time: 3:10 AM End time: 3:23 PM  Meds ordered this encounter  Medications  . ALPRAZolam (XANAX) 0.5 MG tablet    Sig: Take 1 tablet (0.5 mg total) by mouth 3 (three) times daily as needed. For anxiety    Dispense:  90 tablet    Refill:  0    Order Specific Question:   Supervising Provider    Answer:   Janora Norlander [[id number]]  . atorvastatin (LIPITOR) 40 MG tablet    Sig: Take 1 tablet (40 mg total) by mouth every evening.    Dispense:  90 tablet    Refill:  1    Order Specific Question:   Supervising Provider    Answer:   Janora Norlander [[id number]]  . lurasidone  (LATUDA) 40 MG TABS tablet    Sig: TAKE 1 TABLET WITH BREAKFAST    Dispense:  30 tablet    Refill:  11    Order Specific Question:   Supervising Provider    Answer:   Janora Norlander [[id number]]  . omeprazole (PRILOSEC) 20 MG capsule    Sig: Take 2 capsules (40 mg total) by mouth daily.    Dispense:  180 capsule    Refill:  3    Order Specific Question:   Supervising Provider    Answer:   Janora Norlander [[id number]]  . FLUoxetine (PROZAC) 40 MG  capsule    Sig: Take 1 capsule (40 mg total) by mouth daily.    Dispense:  90 capsule    Refill:  1    Order Specific Question:   Supervising Provider    Answer:   Janora Norlander [[id number]]  . fluticasone (FLONASE) 50 MCG/ACT nasal spray    Sig: 1 SPRAY IN EACH NOSTRIL 2 TIMES A DAY AS NEEDED FOR ALLERGY OR RHINITIS    Dispense:  16 g    Refill:  2    Order Specific Question:   Supervising Provider    Answer:   Janora Norlander [[id number]]    Particia Nearing PA-Penny [place] [phone]

## 2019-01-16 ENCOUNTER — Other Ambulatory Visit: Payer: Self-pay

## 2019-01-16 ENCOUNTER — Encounter: Payer: Self-pay | Admitting: Family Medicine

## 2019-01-16 ENCOUNTER — Ambulatory Visit: Payer: Medicaid Other | Admitting: Family Medicine

## 2019-01-16 VITALS — BP 124/59 | HR 115 | Temp 98.9°F | Resp 20 | Ht 63.2 in | Wt 152.0 lb

## 2019-01-16 DIAGNOSIS — M5442 Lumbago with sciatica, left side: Secondary | ICD-10-CM | POA: Diagnosis not present

## 2019-01-16 DIAGNOSIS — E1140 Type 2 diabetes mellitus with diabetic neuropathy, unspecified: Secondary | ICD-10-CM | POA: Diagnosis not present

## 2019-01-16 LAB — BAYER DCA HB A1C WAIVED: HB A1C (BAYER DCA - WAIVED): 9.1 % — ABNORMAL HIGH (ref <7.0)

## 2019-01-16 MED ORDER — PREDNISONE 20 MG PO TABS: ORAL_TABLET | ORAL | 0 refills | Status: DC

## 2019-01-16 MED ORDER — KETOROLAC TROMETHAMINE 60 MG/2ML IM SOLN
60.0000 mg | Freq: Once | INTRAMUSCULAR | Status: AC
  Administered 2019-01-16: 60 mg via INTRAMUSCULAR

## 2019-01-16 NOTE — Patient Instructions (Signed)
Acute Back Pain, Adult Acute back pain is sudden and usually short-lived. It is often caused by an injury to the muscles and tissues in the back. The injury may result from:  A muscle or ligament getting overstretched or torn (strained). Ligaments are tissues that connect bones to each other. Lifting something improperly can cause a back strain.  Wear and tear (degeneration) of the spinal disks. Spinal disks are circular tissue that provides cushioning between the bones of the spine (vertebrae).  Twisting motions, such as while playing sports or doing yard work.  A hit to the back.  Arthritis. You may have a physical exam, lab tests, and imaging tests to find the cause of your pain. Acute back pain usually goes away with rest and home care. Follow these instructions at home: Managing pain, stiffness, and swelling  Take over-the-counter and prescription medicines only as told by your health care provider.  Your health care provider may recommend applying ice during the first 24-48 hours after your pain starts. To do this: ? Put ice in a plastic bag. ? Place a towel between your skin and the bag. ? Leave the ice on for 20 minutes, 2-3 times a day.  If directed, apply heat to the affected area as often as told by your health care provider. Use the heat source that your health care provider recommends, such as a moist heat pack or a heating pad. ? Place a towel between your skin and the heat source. ? Leave the heat on for 20-30 minutes. ? Remove the heat if your skin turns bright red. This is especially important if you are unable to feel pain, heat, or cold. You have a greater risk of getting burned. Activity   Do not stay in bed. Staying in bed for more than 1-2 days can delay your recovery.  Sit up and stand up straight. Avoid leaning forward when you sit, or hunching over when you stand. ? If you work at a desk, sit close to it so you do not need to lean over. Keep your chin tucked  in. Keep your neck drawn back, and keep your elbows bent at a right angle. Your arms should look like the letter "L." ? Sit high and close to the steering wheel when you drive. Add lower back (lumbar) support to your car seat, if needed.  Take short walks on even surfaces as soon as you are able. Try to increase the length of time you walk each day.  Do not sit, drive, or stand in one place for more than 30 minutes at a time. Sitting or standing for long periods of time can put stress on your back.  Do not drive or use heavy machinery while taking prescription pain medicine.  Use proper lifting techniques. When you bend and lift, use positions that put less stress on your back: ? Bend your knees. ? Keep the load close to your body. ? Avoid twisting.  Exercise regularly as told by your health care provider. Exercising helps your back heal faster and helps prevent back injuries by keeping muscles strong and flexible.  Work with a physical therapist to make a safe exercise program, as recommended by your health care provider. Do any exercises as told by your physical therapist. Lifestyle  Maintain a healthy weight. Extra weight puts stress on your back and makes it difficult to have good posture.  Avoid activities or situations that make you feel anxious or stressed. Stress and anxiety increase muscle   tension and can make back pain worse. Learn ways to manage anxiety and stress, such as through exercise. General instructions  Sleep on a firm mattress in a comfortable position. Try lying on your side with your knees slightly bent. If you lie on your back, put a pillow under your knees.  Follow your treatment plan as told by your health care provider. This may include: ? Cognitive or behavioral therapy. ? Acupuncture or massage therapy. ? Meditation or yoga. Contact a health care provider if:  You have pain that is not relieved with rest or medicine.  You have increasing pain going down  into your legs or buttocks.  Your pain does not improve after 2 weeks.  You have pain at night.  You lose weight without trying.  You have a fever or chills. Get help right away if:  You develop new bowel or bladder control problems.  You have unusual weakness or numbness in your arms or legs.  You develop nausea or vomiting.  You develop abdominal pain.  You feel faint. Summary  Acute back pain is sudden and usually short-lived.  Use proper lifting techniques. When you bend and lift, use positions that put less stress on your back.  Take over-the-counter and prescription medicines and apply heat or ice as directed by your health care provider. This information is not intended to replace advice given to you by your health care provider. Make sure you discuss any questions you have with your health care provider. Document Released: 01/19/2005 Document Revised: 05/10/2018 Document Reviewed: 09/02/2016 [employer] Patient Education  2020 [employer]  

## 2019-01-16 NOTE — Progress Notes (Signed)
Subjective:  Patient ID: Penny Moreno, female    DOB: 12/25/1965, 53 y.o.   MRN: [id number]  Patient Care Team: Theodoro Clock as PCP - General (Physician Assistant)   Chief Complaint:  Back Pain (moving homes  - over done it)   HPI: Penny Moreno is a 53 y.o. female presenting on 01/16/2019 for Back Pain (moving homes  - over done it)   Has been moving and lifting heavy objects and has onset of lower back pain and stiffness on Saturday.   Back Pain This is a new problem. The current episode started in the past 7 days. The problem occurs constantly. The problem has been gradually worsening since onset. The pain is present in the lumbar spine and gluteal. The quality of the pain is described as aching, burning and shooting. The pain radiates to the left thigh. The pain is at a severity of 6/10. The pain is moderate. The pain is the same all the time. The symptoms are aggravated by bending, lying down, position, sitting, standing and twisting. Stiffness is present in the morning. Associated symptoms include leg pain. Pertinent negatives include no abdominal pain, bladder incontinence, bowel incontinence, chest pain, dysuria, fever, headaches, numbness, paresis, paresthesias, pelvic pain, perianal numbness, tingling, weakness or weight loss. She has tried NSAIDs, ice and muscle relaxant for the symptoms. The treatment provided mild relief.     Relevant past medical, surgical, family, and social history reviewed and updated as indicated.  Allergies and medications reviewed and updated. Date reviewed: Chart in Epic.   Past Medical History:  Diagnosis Date  . Anxiety   . Arthritis   . Back pain   . Bipolar disorder ([facility])   . Depression   . Eating disorder   . Frozen shoulder   . GERD (gastroesophageal reflux disease)   . Leukocytosis 07/02/2015  . Neuromuscular disorder ([facility])    Fibromyalgia  . OCD (obsessive compulsive disorder)   . Panic disorder   . Vaginal Pap  smear, abnormal   . Vocal cord polyps     Past Surgical History:  Procedure Laterality Date  . DIAGNOSTIC LAPAROSCOPY     [place]  . LAPAROSCOPIC TUBAL LIGATION  10/30/2011   Procedure: LAPAROSCOPIC TUBAL LIGATION;  Surgeon: Jonnie Kind, MD;  Location: [facility];  Service: Gynecology;  Laterality: Bilateral;  Laparoscopic Bilateral tubal ligation with falope rings  . lt shoulder surgery    . TONSILLECTOMY    . TUBAL LIGATION  10/04/2011    Social History   Socioeconomic History  . Marital status: Legally Separated    Spouse name: Not on file  . Number of children: Not on file  . Years of education: Not on file  . Highest education level: Not on file  Occupational History  . Not on file  Tobacco Use  . Smoking status: Current Every Day Smoker    Packs/day: 0.50    Years: 30.00    Pack years: 15.00    Types: Cigarettes  . Smokeless tobacco: Never Used  Substance and Sexual Activity  . Alcohol use: No  . Drug use: No    Types: Marijuana  . Sexual activity: Yes    Birth control/protection: None, Post-menopausal  Other Topics Concern  . Not on file  Social History Narrative  . Not on file   Social Determinants of Health   Financial Resource Strain:   . Difficulty of Paying Living Expenses: Not on file  Food Insecurity:   .  Worried About Charity fundraiser in the Last Year: Not on file  . Ran Out of Food in the Last Year: Not on file  Transportation Needs:   . Lack of Transportation (Medical): Not on file  . Lack of Transportation (Non-Medical): Not on file  Physical Activity:   . Days of Exercise per Week: Not on file  . Minutes of Exercise per Session: Not on file  Stress:   . Feeling of Stress : Not on file  Social Connections:   . Frequency of Communication with Friends and Family: Not on file  . Frequency of Social Gatherings with Friends and Family: Not on file  . Attends Religious Services: Not on file  . Active Member of Clubs or Organizations: Not on  file  . Attends Archivist Meetings: Not on file  . Marital Status: Not on file  Intimate Partner Violence:   . Fear of Current or Ex-Partner: Not on file  . Emotionally Abused: Not on file  . Physically Abused: Not on file  . Sexually Abused: Not on file    Outpatient Encounter Medications as of 01/16/2019  Medication Sig  . ALPRAZolam (XANAX) 0.5 MG tablet Take 1 tablet (0.5 mg total) by mouth 3 (three) times daily as needed. For anxiety  . atorvastatin (LIPITOR) 40 MG tablet Take 1 tablet (40 mg total) by mouth every evening.  . cholecalciferol (VITAMIN D) 1000 units tablet Take 1,000 Units by mouth daily.  . cyclobenzaprine (FLEXERIL) 10 MG tablet TAKE 1 TABLET THREE TIMES DAILY AS NEEDED FOR MUSCLE SPASM  . fenofibrate (TRICOR) 145 MG tablet Take 1 tablet (145 mg total) by mouth daily.  Marland Kitchen FLUoxetine (PROZAC) 40 MG capsule Take 1 capsule (40 mg total) by mouth daily.  . fluticasone (FLONASE) 50 MCG/ACT nasal spray 1 SPRAY IN EACH NOSTRIL 2 TIMES A DAY AS NEEDED FOR ALLERGY OR RHINITIS  . furosemide (LASIX) 20 MG tablet TAKE 1 TABLET (20 MG TOTAL) BY MOUTH DAILY AS NEEDED.  Marland Kitchen gabapentin (NEURONTIN) 300 MG capsule Take 1-2 capsules (300-600 mg total) by mouth at bedtime.  Marland Kitchen lurasidone (LATUDA) 40 MG TABS tablet TAKE 1 TABLET WITH BREAKFAST  . nabumetone (RELAFEN) 750 MG tablet Take 1 tablet (750 mg total) by mouth daily. Failed mobic  . omeprazole (PRILOSEC) 20 MG capsule Take 2 capsules (40 mg total) by mouth daily.  . pioglitazone (ACTOS) 30 MG tablet Take 1 tablet (30 mg total) by mouth daily.  . Probiotic Product (PROBIOTIC PEARLS PO) Take 1 capsule by mouth daily.  . predniSONE (DELTASONE) 20 MG tablet 2 po at sametime daily for 5 days  . [DISCONTINUED] fluconazole (DIFLUCAN) 150 MG tablet 1 po q week x 4 weeks  . [DISCONTINUED] predniSONE (STERAPRED UNI-PAK 21 TAB) 10 MG (21) TBPK tablet Use as directed on back of pill pack  . [EXPIRED] ketorolac (TORADOL) injection 60  mg    No facility-administered encounter medications on file as of 01/16/2019.    Allergies  Allergen Reactions  . Tetracyclines & Related Nausea And Vomiting  . Latex     Irritates skin   . Sulfa Antibiotics Nausea And Vomiting    Review of Systems  Constitutional: Negative for activity change, appetite change, chills, diaphoresis, fatigue, fever, unexpected weight change and weight loss.  HENT: Negative.   Eyes: Negative.   Respiratory: Negative for cough, chest tightness and shortness of breath.   Cardiovascular: Negative for chest pain, palpitations and leg swelling.  Gastrointestinal: Negative for abdominal pain,  blood in stool, bowel incontinence, constipation, diarrhea, nausea and vomiting.  Endocrine: Negative.   Genitourinary: Negative for bladder incontinence, decreased urine volume, difficulty urinating, dysuria, frequency, pelvic pain and urgency.  Musculoskeletal: Positive for arthralgias, back pain, gait problem and myalgias. Negative for joint swelling, neck pain and neck stiffness.  Skin: Negative.   Allergic/Immunologic: Negative.   Neurological: Negative for dizziness, tingling, weakness, numbness, headaches and paresthesias.  Hematological: Negative.   Psychiatric/Behavioral: Negative for confusion, hallucinations, sleep disturbance and suicidal ideas.  All other systems reviewed and are negative.       Objective:  BP (!) 124/59   Pulse (!) 115   Temp 98.9 F (37.2 C)   Resp 20   Ht 5' 3.2" (1.605 m)   Wt 152 lb (68.9 kg)   LMP 09/20/2011   SpO2 99%   BMI 26.76 kg/m    Wt Readings from Last 3 Encounters:  01/16/19 152 lb (68.9 kg)  03/22/18 151 lb (68.5 kg)  12/22/17 148 lb (67.1 kg)    Physical Exam Vitals and nursing note reviewed.  Constitutional:      General: She is not in acute distress.    Appearance: Normal appearance. She is well-developed, well-groomed and overweight. She is not ill-appearing, toxic-appearing or diaphoretic.  HENT:      Head: Normocephalic and atraumatic.     Jaw: There is normal jaw occlusion.     Right Ear: Hearing normal.     Left Ear: Hearing normal.     Nose: Nose normal.     Mouth/Throat:     Lips: Pink.     Mouth: Mucous membranes are moist.     Pharynx: Oropharynx is clear. Uvula midline.  Eyes:     General: Lids are normal.     Extraocular Movements: Extraocular movements intact.     Conjunctiva/sclera: Conjunctivae normal.     Pupils: Pupils are equal, round, and reactive to light.  Neck:     Thyroid: No thyroid mass, thyromegaly or thyroid tenderness.     Vascular: No carotid bruit or JVD.     Trachea: Trachea and phonation normal.  Cardiovascular:     Rate and Rhythm: Normal rate and regular rhythm.     Chest Wall: PMI is not displaced.     Pulses: Normal pulses.     Heart sounds: Normal heart sounds. No murmur. No friction rub. No gallop.   Pulmonary:     Effort: Pulmonary effort is normal. No respiratory distress.     Breath sounds: Normal breath sounds. No wheezing.  Abdominal:     General: Bowel sounds are normal. There is no distension or abdominal bruit.     Palpations: Abdomen is soft. There is no hepatomegaly or splenomegaly.     Tenderness: There is no abdominal tenderness. There is no right CVA tenderness or left CVA tenderness.     Hernia: No hernia is present.  Musculoskeletal:     Cervical back: Normal, normal range of motion and neck supple.     Thoracic back: Normal.     Lumbar back: Tenderness present. No swelling, edema, deformity, signs of trauma, lacerations, spasms or bony tenderness. Decreased range of motion (flexion, extension, and lateral rotation due to pain). Positive left straight leg raise test. Negative right straight leg raise test. No scoliosis.     Right lower leg: No edema.     Left lower leg: No edema.  Lymphadenopathy:     Cervical: No cervical adenopathy.  Skin:    General:  Skin is warm and dry.     Capillary Refill: Capillary refill takes  less than 2 seconds.     Coloration: Skin is not cyanotic, jaundiced or pale.     Findings: No rash.  Neurological:     General: No focal deficit present.     Mental Status: She is alert and oriented to person, place, and time.     Cranial Nerves: Cranial nerves are intact.     Sensory: Sensation is intact.     Motor: Motor function is intact.     Coordination: Coordination is intact.     Gait: Gait is intact.     Deep Tendon Reflexes: Reflexes are normal and symmetric.  Psychiatric:        Attention and Perception: Attention and perception normal.        Mood and Affect: Mood and affect normal.        Speech: Speech normal.        Behavior: Behavior normal. Behavior is cooperative.        Thought Content: Thought content normal.        Cognition and Memory: Cognition and memory normal.        Judgment: Judgment normal.     Results for orders placed or performed in visit on 11/07/18  Urine Culture   Specimen: Urine   URINE  Result Value Ref Range   Urine Culture, Routine Final report (A)    Organism ID, Bacteria Comment (A)    ORGANISM ID, BACTERIA Yeast isolated. (A)    ORGANISM ID, BACTERIA Comment   Microscopic Examination   URINE  Result Value Ref Range   WBC, UA 11-30 (A) 0 - 5 /hpf   RBC 3-10 (A) 0 - 2 /hpf   Epithelial Cells (non renal) 0-10 0 - 10 /hpf   Renal Epithel, UA None seen None seen /hpf   Bacteria, UA Moderate (A) None seen/Few   Yeast, UA Present None seen  Urinalysis, Complete  Result Value Ref Range   Specific Gravity, UA 1.020 1.005 - 1.030   pH, UA 7.5 5.0 - 7.5   Color, UA Yellow Yellow   Appearance Ur Clear Clear   Leukocytes,UA Negative Negative   Protein,UA Negative Negative/Trace   Glucose, UA 3+ (A) Negative   Ketones, UA Negative Negative   RBC, UA Negative Negative   Bilirubin, UA Negative Negative   Urobilinogen, Ur 0.2 0.2 - 1.0 mg/dL   Nitrite, UA Negative Negative   Microscopic Examination See below:   Bayer DCA Hb A1c Waived   Result Value Ref Range   HB A1C (BAYER DCA - WAIVED) 10.6 (H) <7.0 %  CBC with Differential/Platelet  Result Value Ref Range   WBC 9.1 3.4 - 10.8 x10E3/uL   RBC 4.30 3.77 - 5.28 x10E6/uL   Hemoglobin 14.6 11.1 - 15.9 g/dL   Hematocrit 42.8 34.0 - 46.6 %   MCV 100 (H) 79 - 97 fL   MCH 34.0 (H) 26.6 - 33.0 pg   MCHC 34.1 31.5 - 35.7 g/dL   RDW 14.6 11.7 - 15.4 %   Platelets 291 150 - 450 x10E3/uL   Neutrophils 69 Not Estab. %   Lymphs 26 Not Estab. %   Monocytes 3 Not Estab. %   Eos 1 Not Estab. %   Basos 1 Not Estab. %   Neutrophils Absolute 6.3 1.4 - 7.0 x10E3/uL   Lymphocytes Absolute 2.3 0.7 - 3.1 x10E3/uL   Monocytes Absolute 0.3 0.1 - 0.9 x10E3/uL  EOS (ABSOLUTE) 0.1 0.0 - 0.4 x10E3/uL   Basophils Absolute 0.1 0.0 - 0.2 x10E3/uL   Immature Granulocytes 0 Not Estab. %   Immature Grans (Abs) 0.0 0.0 - 0.1 x10E3/uL  Microalbumin / creatinine urine ratio  Result Value Ref Range   Creatinine, Urine 64.9 Not Estab. mg/dL   Microalbumin, Urine 14.1 Not Estab. ug/mL   Microalb/Creat Ratio 22 0 - 29 mg/g creat  Lipid panel  Result Value Ref Range   Cholesterol, Total 164 100 - 199 mg/dL   Triglycerides 448 (H) 0 - 149 mg/dL   HDL 23 (L) >39 mg/dL   VLDL Cholesterol Cal 71 (H) 5 - 40 mg/dL   LDL Chol Calc (NIH) 70 0 - 99 mg/dL   Chol/HDL Ratio 7.1 (H) 0.0 - 4.4 ratio  CMP14+EGFR  Result Value Ref Range   Glucose 340 (H) 65 - 99 mg/dL   BUN 6 6 - 24 mg/dL   Creatinine, Ser 0.76 0.57 - 1.00 mg/dL   GFR calc non Af Amer 91 >59 mL/min/1.73   GFR calc Af Amer 105 >59 mL/min/1.73   BUN/Creatinine Ratio 8 (L) 9 - 23   Sodium 137 134 - 144 mmol/L   Potassium 4.0 3.5 - 5.2 mmol/L   Chloride 97 96 - 106 mmol/L   CO2 24 20 - 29 mmol/L   Calcium 9.1 8.7 - 10.2 mg/dL   Total Protein 6.4 6.0 - 8.5 g/dL   Albumin 4.3 3.8 - 4.9 g/dL   Globulin, Total 2.1 1.5 - 4.5 g/dL   Albumin/Globulin Ratio 2.0 1.2 - 2.2   Bilirubin Total 0.2 0.0 - 1.2 mg/dL   Alkaline Phosphatase 90 39 - 117  IU/L   AST 14 0 - 40 IU/L   ALT 16 0 - 32 IU/L       Pertinent labs & imaging results that were available during my care of the patient were reviewed by me and considered in my medical decision making.  Assessment & Plan:  Angline was seen today for back pain.  Diagnoses and all orders for this visit:  Acute left-sided low back pain with left-sided sciatica Acute low back pain after moving heavy objects. Positive left SLR test. No red flags concerning for cauda equina syndrome. Will initiate below. Report any new, worsening, or persistent symptoms.  -     ketorolac (TORADOL) injection 60 mg -     predniSONE (DELTASONE) 20 MG tablet; 2 po at sametime daily for 5 days     Continue all other maintenance medications.  Follow up plan: Return in about 6 weeks (around 02/27/2019), or if symptoms worsen or fail to improve.  Continue healthy lifestyle choices, including diet (rich in fruits, vegetables, and lean proteins, and low in salt and simple carbohydrates) and exercise (at least 30 minutes of moderate physical activity daily).  Educational handout given for low back pain  The above assessment and management plan was discussed with the patient. The patient verbalized understanding of and has agreed to the management plan. Patient is aware to call the clinic if they develop any new symptoms or if symptoms persist or worsen. Patient is aware when to return to the clinic for a follow-up visit. Patient educated on when it is appropriate to go to the emergency department.   Monia Pouch, FNP-[facility] [phone]

## 2019-01-17 LAB — CBC WITH DIFFERENTIAL/PLATELET
Basophils Absolute: 0.1 10*3/uL (ref 0.0–0.2)
Basos: 1 %
EOS (ABSOLUTE): 0.2 10*3/uL (ref 0.0–0.4)
Eos: 2 %
Hematocrit: 41.2 % (ref 34.0–46.6)
Hemoglobin: 14.2 g/dL (ref 11.1–15.9)
Immature Grans (Abs): 0 10*3/uL (ref 0.0–0.1)
Immature Granulocytes: 0 %
Lymphocytes Absolute: 3.4 10*3/uL — ABNORMAL HIGH (ref 0.7–3.1)
Lymphs: 34 %
MCH: 33.7 pg — ABNORMAL HIGH (ref 26.6–33.0)
MCHC: 34.5 g/dL (ref 31.5–35.7)
MCV: 98 fL — ABNORMAL HIGH (ref 79–97)
Monocytes Absolute: 0.5 10*3/uL (ref 0.1–0.9)
Monocytes: 5 %
Neutrophils Absolute: 5.8 10*3/uL (ref 1.4–7.0)
Neutrophils: 58 %
Platelets: 377 10*3/uL (ref 150–450)
RBC: 4.21 x10E6/uL (ref 3.77–5.28)
RDW: 12.5 % (ref 11.7–15.4)
WBC: 9.9 10*3/uL (ref 3.4–10.8)

## 2019-01-17 LAB — LIPID PANEL
Chol/HDL Ratio: 4.3 ratio (ref 0.0–4.4)
Cholesterol, Total: 195 mg/dL (ref 100–199)
HDL: 45 mg/dL (ref >39)
LDL Chol Calc (NIH): 117 mg/dL — ABNORMAL HIGH (ref 0–99)
Triglycerides: 190 mg/dL — ABNORMAL HIGH (ref 0–149)
VLDL Cholesterol Cal: 33 mg/dL (ref 5–40)

## 2019-01-17 LAB — CMP14+EGFR
ALT: 18 IU/L (ref 0–32)
AST: 15 IU/L (ref 0–40)
Albumin/Globulin Ratio: 2 (ref 1.2–2.2)
Albumin: 4.5 g/dL (ref 3.8–4.9)
Alkaline Phosphatase: 102 IU/L (ref 39–117)
BUN/Creatinine Ratio: 10 (ref 9–23)
BUN: 6 mg/dL (ref 6–24)
Bilirubin Total: 0.2 mg/dL (ref 0.0–1.2)
CO2: 26 mmol/L (ref 20–29)
Calcium: 9.7 mg/dL (ref 8.7–10.2)
Chloride: 101 mmol/L (ref 96–106)
Creatinine, Ser: 0.59 mg/dL (ref 0.57–1.00)
GFR calc Af Amer: 123 mL/min/{1.73_m2} (ref >59)
GFR calc non Af Amer: 106 mL/min/{1.73_m2} (ref >59)
Globulin, Total: 2.2 g/dL (ref 1.5–4.5)
Glucose: 141 mg/dL — ABNORMAL HIGH (ref 65–99)
Potassium: 4.3 mmol/L (ref 3.5–5.2)
Sodium: 143 mmol/L (ref 134–144)
Total Protein: 6.7 g/dL (ref 6.0–8.5)

## 2019-01-17 LAB — TSH: TSH: 2.07 u[IU]/mL (ref 0.450–4.500)

## 2019-01-23 ENCOUNTER — Telehealth: Payer: Self-pay | Admitting: Physician Assistant

## 2019-01-23 ENCOUNTER — Other Ambulatory Visit: Payer: Self-pay | Admitting: Physician Assistant

## 2019-01-23 MED ORDER — TRAMADOL HCL 50 MG PO TABS: 50.0000 mg | ORAL_TABLET | Freq: Four times a day (QID) | ORAL | 0 refills | Status: AC

## 2019-01-23 NOTE — Telephone Encounter (Signed)
I will send him in a limited amount of Ultram to your pharmacy.

## 2019-01-23 NOTE — Telephone Encounter (Signed)
Patient aware and verbalizes understanding. 

## 2019-02-01 ENCOUNTER — Other Ambulatory Visit: Payer: Self-pay | Admitting: *Deleted

## 2019-02-01 DIAGNOSIS — M797 Fibromyalgia: Secondary | ICD-10-CM

## 2019-02-08 ENCOUNTER — Other Ambulatory Visit: Payer: Self-pay | Admitting: *Deleted

## 2019-02-08 NOTE — Telephone Encounter (Signed)
TC to patient, she is not needing refill at this time, did not request refill. Closing encounter

## 2019-02-24 ENCOUNTER — Other Ambulatory Visit: Payer: Self-pay | Admitting: Physician Assistant

## 2019-02-24 DIAGNOSIS — M542 Cervicalgia: Secondary | ICD-10-CM

## 2019-02-24 DIAGNOSIS — F331 Major depressive disorder, recurrent, moderate: Secondary | ICD-10-CM

## 2019-03-28 ENCOUNTER — Telehealth: Payer: Self-pay | Admitting: *Deleted

## 2019-03-28 NOTE — Telephone Encounter (Addendum)
Prior Auth for pioglitazone 30mg  tab-APPROVED  Pharmacy notified  Key: [id number]  [place] Medicaid form on Covermymeds

## 2019-04-04 DIAGNOSIS — F603 Borderline personality disorder: Secondary | ICD-10-CM | POA: Insufficient documentation

## 2019-04-04 DIAGNOSIS — F411 Generalized anxiety disorder: Secondary | ICD-10-CM | POA: Insufficient documentation

## 2019-04-04 DIAGNOSIS — M1990 Unspecified osteoarthritis, unspecified site: Secondary | ICD-10-CM | POA: Insufficient documentation

## 2019-04-04 DIAGNOSIS — F319 Bipolar disorder, unspecified: Secondary | ICD-10-CM | POA: Insufficient documentation

## 2019-04-04 DIAGNOSIS — J309 Allergic rhinitis, unspecified: Secondary | ICD-10-CM | POA: Insufficient documentation

## 2019-04-04 DIAGNOSIS — E1151 Type 2 diabetes mellitus with diabetic peripheral angiopathy without gangrene: Secondary | ICD-10-CM | POA: Insufficient documentation

## 2019-04-04 DIAGNOSIS — F4310 Post-traumatic stress disorder, unspecified: Secondary | ICD-10-CM | POA: Insufficient documentation

## 2019-04-04 DIAGNOSIS — E119 Type 2 diabetes mellitus without complications: Secondary | ICD-10-CM | POA: Insufficient documentation

## 2019-04-04 DIAGNOSIS — E782 Mixed hyperlipidemia: Secondary | ICD-10-CM | POA: Insufficient documentation

## 2019-05-02 ENCOUNTER — Other Ambulatory Visit: Payer: Self-pay | Admitting: Family Medicine

## 2019-11-01 DIAGNOSIS — I272 Pulmonary hypertension, unspecified: Secondary | ICD-10-CM | POA: Insufficient documentation

## 2019-11-20 DIAGNOSIS — M4317 Spondylolisthesis, lumbosacral region: Secondary | ICD-10-CM | POA: Insufficient documentation

## 2019-12-26 ENCOUNTER — Other Ambulatory Visit: Payer: Self-pay | Admitting: Family Medicine

## 2020-01-09 ENCOUNTER — Other Ambulatory Visit: Payer: Self-pay | Admitting: Family

## 2020-05-30 DIAGNOSIS — G629 Polyneuropathy, unspecified: Secondary | ICD-10-CM | POA: Insufficient documentation

## 2020-06-04 DIAGNOSIS — K219 Gastro-esophageal reflux disease without esophagitis: Secondary | ICD-10-CM | POA: Insufficient documentation

## 2020-08-18 IMAGING — DX DG CERVICAL SPINE COMPLETE 4+V
5 series · 5 of 5 positions shown · non-contrast
Comparison: November 18, 2012

CLINICAL DATA: Cervicalgia

EXAM:
CERVICAL SPINE - COMPLETE 4+ VIEW

[c-spine lat]
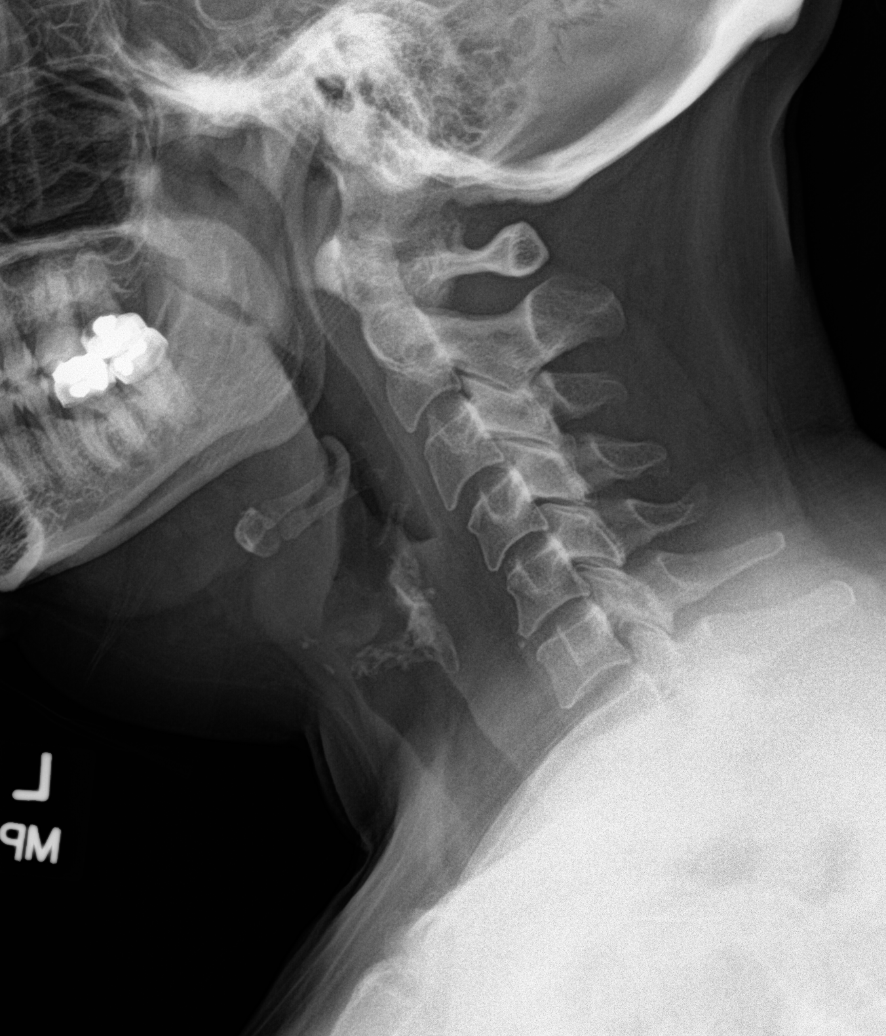

[c-spine obl]
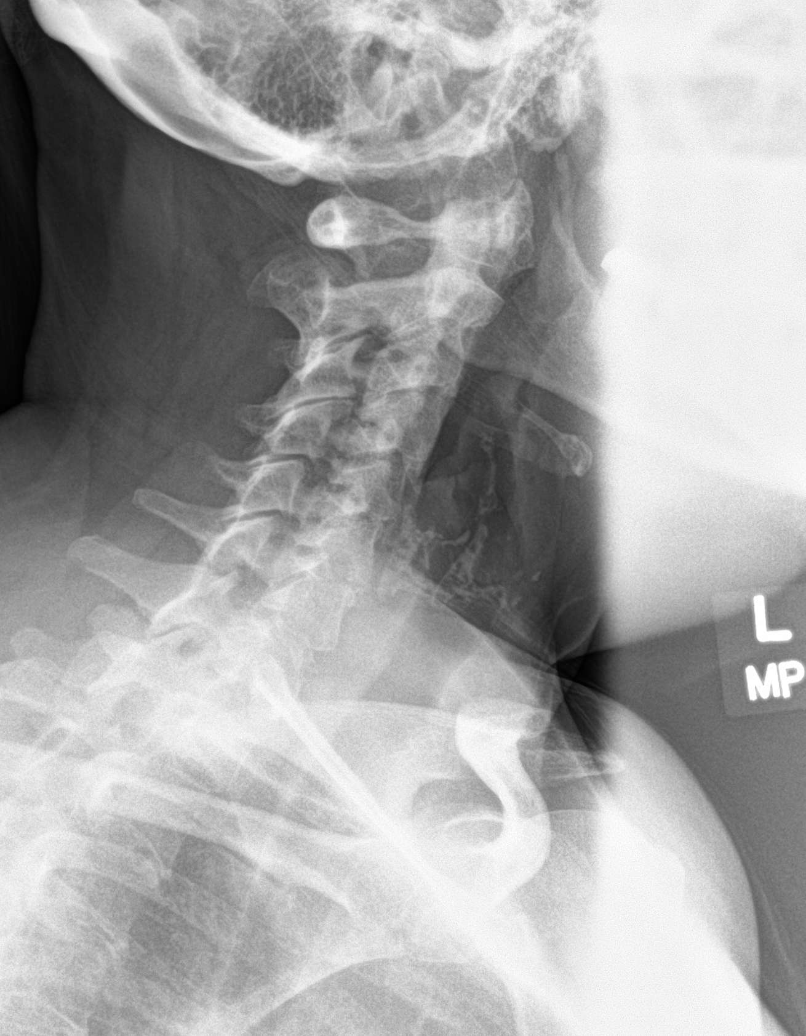

[c-spine ap (1 of 2)]
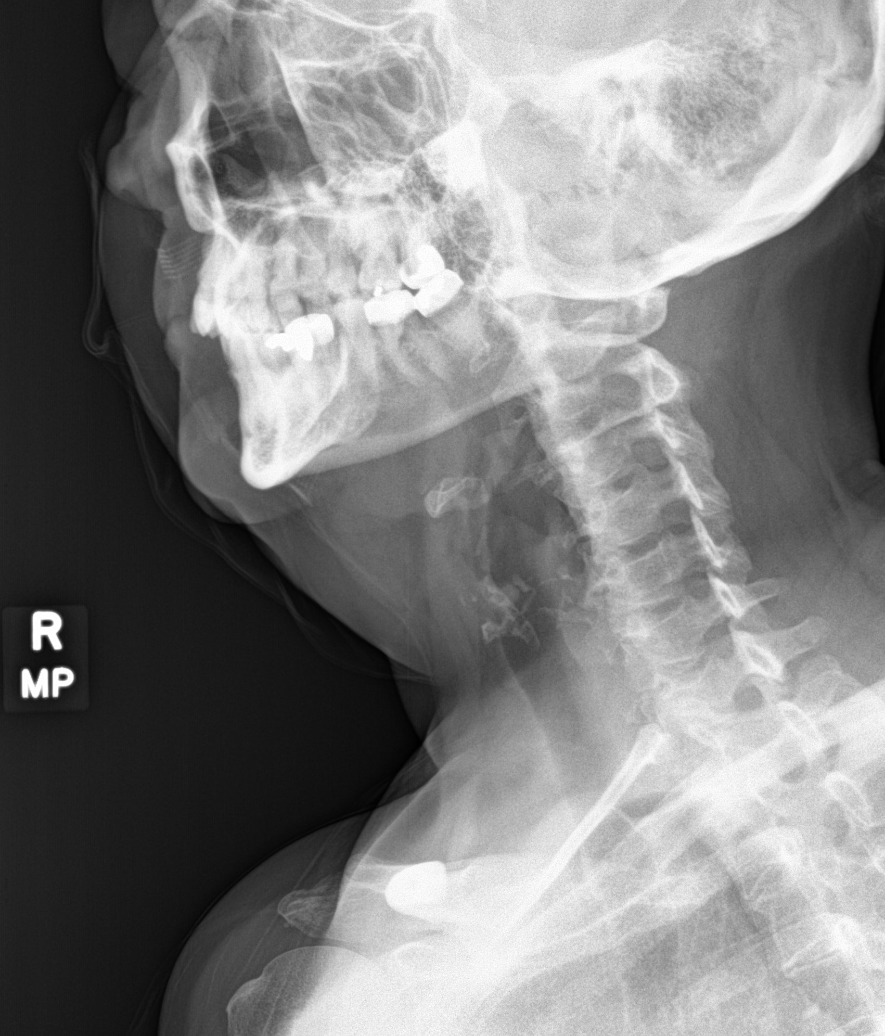

[c-spine open mouth]
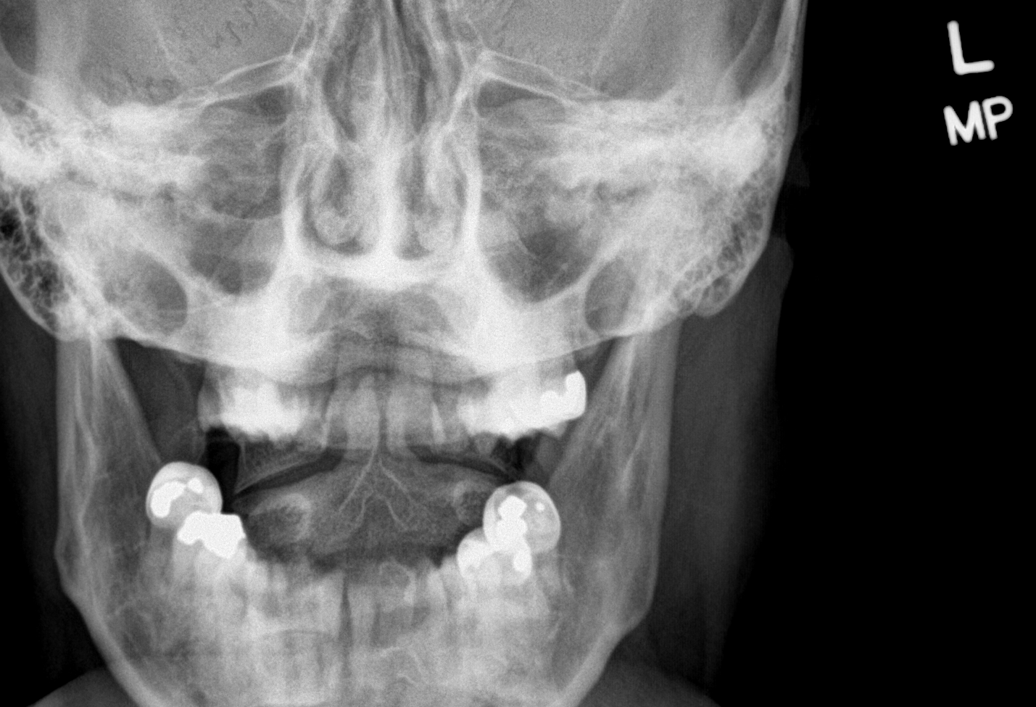

[c-spine ap (2 of 2)]
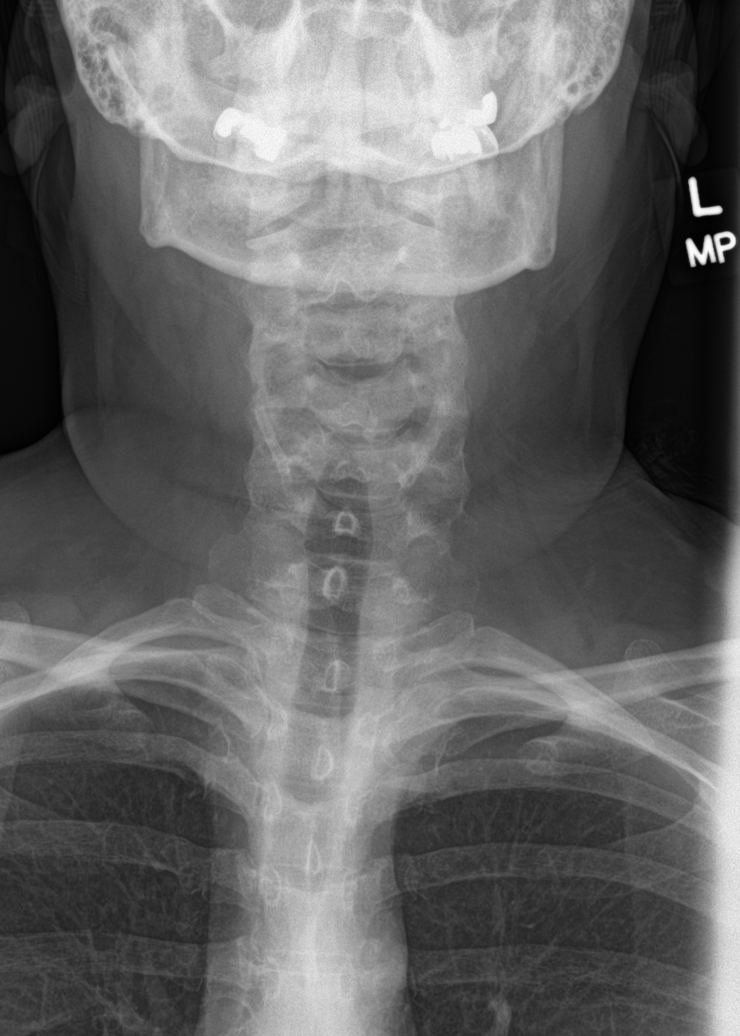

[5 of 5 positions shown; findings below may reference images not displayed]

FINDINGS: Frontal, lateral, open-mouth odontoid, and bilateral oblique views
were obtained. There is no fracture or spondylolisthesis.
Prevertebral soft tissues and predental space regions are normal.
There is slight disc space narrowing at C4-5 and C5-6. Other disc
spaces appear normal. There is a small focus of calcification in the
anterior ligament at C5-6, stable. There is no appreciable exit
foraminal narrowing on the oblique views. Lung apices are clear.
IMPRESSION: Rather minimal osteoarthritic change at C4-5 and C5-6. No fracture
or spondylolisthesis.

## 2021-12-31 DIAGNOSIS — G894 Chronic pain syndrome: Secondary | ICD-10-CM | POA: Insufficient documentation

## 2022-07-17 LAB — COLOGUARD: COLOGUARD: NEGATIVE

## 2022-09-22 ENCOUNTER — Telehealth: Payer: Self-pay | Admitting: Nurse Practitioner

## 2022-09-22 ENCOUNTER — Encounter: Payer: Self-pay | Admitting: Nurse Practitioner

## 2022-09-22 ENCOUNTER — Ambulatory Visit: Payer: Medicaid Other | Admitting: Nurse Practitioner

## 2022-09-22 VITALS — BP 117/84 | HR 103 | Temp 98.1°F | Ht 63.0 in | Wt 157.8 lb

## 2022-09-22 DIAGNOSIS — F3130 Bipolar disorder, current episode depressed, mild or moderate severity, unspecified: Secondary | ICD-10-CM | POA: Diagnosis not present

## 2022-09-22 DIAGNOSIS — E1151 Type 2 diabetes mellitus with diabetic peripheral angiopathy without gangrene: Secondary | ICD-10-CM

## 2022-09-22 DIAGNOSIS — I272 Pulmonary hypertension, unspecified: Secondary | ICD-10-CM | POA: Diagnosis not present

## 2022-09-22 DIAGNOSIS — E1140 Type 2 diabetes mellitus with diabetic neuropathy, unspecified: Secondary | ICD-10-CM | POA: Diagnosis not present

## 2022-09-22 DIAGNOSIS — E1169 Type 2 diabetes mellitus with other specified complication: Secondary | ICD-10-CM | POA: Diagnosis not present

## 2022-09-22 DIAGNOSIS — Z1231 Encounter for screening mammogram for malignant neoplasm of breast: Secondary | ICD-10-CM | POA: Insufficient documentation

## 2022-09-22 DIAGNOSIS — Z7984 Long term (current) use of oral hypoglycemic drugs: Secondary | ICD-10-CM | POA: Diagnosis not present

## 2022-09-22 DIAGNOSIS — K219 Gastro-esophageal reflux disease without esophagitis: Secondary | ICD-10-CM

## 2022-09-22 DIAGNOSIS — Z7985 Long-term (current) use of injectable non-insulin antidiabetic drugs: Secondary | ICD-10-CM

## 2022-09-22 DIAGNOSIS — F17210 Nicotine dependence, cigarettes, uncomplicated: Secondary | ICD-10-CM | POA: Diagnosis not present

## 2022-09-22 DIAGNOSIS — Z716 Tobacco abuse counseling: Secondary | ICD-10-CM

## 2022-09-22 DIAGNOSIS — E1159 Type 2 diabetes mellitus with other circulatory complications: Secondary | ICD-10-CM

## 2022-09-22 DIAGNOSIS — I152 Hypertension secondary to endocrine disorders: Secondary | ICD-10-CM

## 2022-09-22 DIAGNOSIS — Z23 Encounter for immunization: Secondary | ICD-10-CM | POA: Diagnosis not present

## 2022-09-22 DIAGNOSIS — F331 Major depressive disorder, recurrent, moderate: Secondary | ICD-10-CM

## 2022-09-22 DIAGNOSIS — Z0000 Encounter for general adult medical examination without abnormal findings: Secondary | ICD-10-CM | POA: Insufficient documentation

## 2022-09-22 DIAGNOSIS — Z720 Tobacco use: Secondary | ICD-10-CM

## 2022-09-22 LAB — BAYER DCA HB A1C WAIVED: HB A1C (BAYER DCA - WAIVED): 11.7 % — ABNORMAL HIGH (ref 4.8–5.6)

## 2022-09-22 MED ORDER — EZETIMIBE 10 MG PO TABS
10.0000 mg | ORAL_TABLET | Freq: Every day | ORAL | 0 refills | Status: DC
Start: 2022-09-22 — End: 2022-12-23

## 2022-09-22 MED ORDER — LURASIDONE HCL 40 MG PO TABS
ORAL_TABLET | ORAL | 0 refills | Status: AC
Start: 2022-09-22 — End: ?

## 2022-09-22 MED ORDER — GLIPIZIDE ER 5 MG PO TB24
5.0000 mg | ORAL_TABLET | Freq: Every day | ORAL | 0 refills | Status: DC
Start: 2022-09-22 — End: 2022-12-23

## 2022-09-22 MED ORDER — ATORVASTATIN CALCIUM 40 MG PO TABS
40.0000 mg | ORAL_TABLET | Freq: Every evening | ORAL | 1 refills | Status: DC
Start: 2022-09-22 — End: 2022-09-23

## 2022-09-22 MED ORDER — SERTRALINE HCL 50 MG PO TABS: 50.0000 mg | ORAL_TABLET | Freq: Every day | ORAL | 0 refills | Status: AC

## 2022-09-22 MED ORDER — PANTOPRAZOLE SODIUM 40 MG PO TBEC
40.0000 mg | DELAYED_RELEASE_TABLET | Freq: Every day | ORAL | 0 refills | Status: DC
Start: 2022-09-22 — End: 2022-12-23

## 2022-09-22 MED ORDER — CETIRIZINE HCL 10 MG PO TABS
10.0000 mg | ORAL_TABLET | Freq: Every day | ORAL | 0 refills | Status: DC
Start: 2022-09-22 — End: 2022-12-23

## 2022-09-22 MED ORDER — FREESTYLE LIBRE 3 SENSOR MISC: 1.0000 | 2 refills | Status: DC

## 2022-09-22 MED ORDER — FREESTYLE LIBRE 3 READER DEVI
1.0000 | Freq: Every day | 0 refills | Status: DC
Start: 2022-09-22 — End: 2023-03-30

## 2022-09-22 MED ORDER — ATENOLOL 25 MG PO TABS
25.0000 mg | ORAL_TABLET | Freq: Every day | ORAL | 0 refills | Status: DC
Start: 2022-09-22 — End: 2022-12-23

## 2022-09-22 MED ORDER — TRULICITY 1.5 MG/0.5ML ~~LOC~~ SOAJ
1.5000 mg | SUBCUTANEOUS | 2 refills | Status: DC
Start: 2022-09-22 — End: 2022-12-24

## 2022-09-22 NOTE — Telephone Encounter (Signed)
Pt aware and verbalized understanding.  

## 2022-09-22 NOTE — Telephone Encounter (Signed)
  Prescription Request  09/22/2022  Is this a "Controlled Substance" medicine? no  Have you seen your PCP in the last 2 weeks? Yes   If YES, route message to pool  -  If NO, patient needs to be scheduled for appointment.  What is the name of the medication or equipment? lurasidone (LATUDA) 40 MG TABS tablet  sertraline (ZOLOFT) 50 MG tablet   Have you contacted your pharmacy to request a refill? No   Which pharmacy would you like this sent to? sertraline (ZOLOFT) 50 MG tablet    Patient notified that their request is being sent to the clinical staff for review and that they should receive a response within 2 business days.

## 2022-09-22 NOTE — Progress Notes (Addendum)
New Patient Office Visit  Subjective    Patient ID: Penny Moreno, female    DOB: 06-20-1965  Age: 57 y.o. MRN: [id number]  CC:  Chief Complaint  Patient presents with   Establish Care    HPI Penny Moreno is a 57 yrs old female presents to establish care DM2 patient, a 57 year old female with type 2 diabetes mellitus (DM2) with an A1C of 11.7%. She is currently managed on Trulicity (dulaglutide), Zetia (ezetimibe), and glipizide, and has not taking any meds for 28-months. She reports ongoing difficulties with blood sugar control, noting frequent episodes of hyperglycemia. The patient also mentions experiencing symptoms of fatigue and occasional blurred vision, which she attributes to her poorly controlled diabetes. She has not experienced any significant hypoglycemic events.  HTN  history of pulmonary hypertension, which has been well controlled with Atenolol. They report significant improvement in symptoms since starting the medication, with reduced shortness of breath and improved exercise tolerance. The patient denies any new symptoms such as chest pain, dizziness, or worsening dyspnea. They have been compliant with their prescribed Atenolol regimen and have not experienced any notable side effects. The patient expresses satisfaction with their current management but seeks reassurance about the long-term control of their condition and any necessary adjustments to their treatment plan.   She is requesting a referral to psych to continue her mental health medications for bipolar and Xanax for her anxiety.  Smokes 1-pack daily, smoking since 57 yrs old Endorses smoking THC every night  Health maintenance Need to make appointment for diabetic retina exam Mammogram ordered Shingrix first dose administered Micro albumin ordered Client refuses PAP  Outpatient Encounter Medications as of 09/22/2022  Medication Sig   albuterol (VENTOLIN HFA) 108 (90 Base) MCG/ACT inhaler INHALE  2 PUFFS EVERY FOUR HOURS AS NEEDED   ALPRAZolam (XANAX) 0.5 MG tablet Take 1 tablet (0.5 mg total) by mouth 3 (three) times daily as needed. For anxiety   Continuous Glucose Receiver (FREESTYLE LIBRE 3 READER) DEVI 1 each by Does not apply route daily.   Continuous Glucose Sensor (FREESTYLE LIBRE 3 SENSOR) MISC 1 each by Does not apply route every 14 (fourteen) days. Place 1 sensor on the skin every 14 days. Use to check glucose continuously   cyclobenzaprine (FLEXERIL) 10 MG tablet TAKE 1 TABLET THREE TIMES DAILY AS NEEDED FOR MUSCLE SPASM   fluticasone (FLONASE) 50 MCG/ACT nasal spray USE 1 SPRAY IN EACH NOSTRIL 2 TIMES A DAY AS NEEDED FOR ALLERGY OR RHINITIS (Needs to be seen before next refill)   lidocaine (LIDODERM) 5 % APPLY 1 PATCH BY TOPICAL ROUTE ONCE DAILY (MAY WEAR UP TO 12HOURS.)   traMADol (ULTRAM) 50 MG tablet Take 1 tablet by mouth 3 (three) times daily as needed.   [DISCONTINUED] atenolol (TENORMIN) 25 MG tablet Take 1 tablet by mouth at bedtime.   [DISCONTINUED] atorvastatin (LIPITOR) 40 MG tablet Take 1 tablet (40 mg total) by mouth every evening.   [DISCONTINUED] cetirizine (ZYRTEC) 10 MG tablet Take 1 tablet by mouth daily.   [DISCONTINUED] Dulaglutide (TRULICITY) 1.5 MG/0.5ML SOPN Inject 1.5 mL every week by subcutaneous route for 28 days, for diabetes.   [DISCONTINUED] ezetimibe (ZETIA) 10 MG tablet Take 1 tablet every day by oral route for 90 days.   [DISCONTINUED] gabapentin (NEURONTIN) 300 MG capsule Take 1-2 capsules (300-600 mg total) by mouth at bedtime. (Patient taking differently: Take 800 mg by mouth at bedtime.)   [DISCONTINUED] glipiZIDE (GLUCOTROL XL) 5 MG 24 hr tablet Take  1 tablet every day by oral route in the morning for 90 days, for diabetes.   [DISCONTINUED] lurasidone (LATUDA) 40 MG TABS tablet TAKE 1 TABLET WITH BREAKFAST   [DISCONTINUED] pantoprazole (PROTONIX) 40 MG tablet Take 1 tablet by mouth daily.   [DISCONTINUED] sertraline (ZOLOFT) 50 MG tablet TAKE  1 TABLET BY MOUTH DAILY DISCONTINUE ABILIFY AND WELLBUTRIN   atenolol (TENORMIN) 25 MG tablet Take 1 tablet (25 mg total) by mouth at bedtime.   cetirizine (ZYRTEC) 10 MG tablet Take 1 tablet (10 mg total) by mouth daily.   Dulaglutide (TRULICITY) 1.5 MG/0.5ML SOPN Inject 1.5 mg into the skin every 7 (seven) days.   ezetimibe (ZETIA) 10 MG tablet Take 1 tablet (10 mg total) by mouth daily.   glipiZIDE (GLUCOTROL XL) 5 MG 24 hr tablet Take 1 tablet (5 mg total) by mouth daily with breakfast.   lurasidone (LATUDA) 40 MG TABS tablet TAKE 1 TABLET WITH BREAKFAST   pantoprazole (PROTONIX) 40 MG tablet Take 1 tablet (40 mg total) by mouth daily.   sertraline (ZOLOFT) 50 MG tablet Take 1 tablet (50 mg total) by mouth daily.   [DISCONTINUED] atorvastatin (LIPITOR) 40 MG tablet Take 1 tablet (40 mg total) by mouth every evening.   [DISCONTINUED] cholecalciferol (VITAMIN D) 1000 units tablet Take 1,000 Units by mouth daily. (Patient not taking: Reported on 09/22/2022)   [DISCONTINUED] fenofibrate (TRICOR) 145 MG tablet Take 1 tablet (145 mg total) by mouth daily. (Patient not taking: Reported on 09/22/2022)   [DISCONTINUED] FLUoxetine (PROZAC) 40 MG capsule Take 1 capsule (40 mg total) by mouth daily. (Patient not taking: Reported on 09/22/2022)   [DISCONTINUED] furosemide (LASIX) 20 MG tablet TAKE 1 TABLET (20 MG TOTAL) BY MOUTH DAILY AS NEEDED. (Patient not taking: Reported on 09/22/2022)   [DISCONTINUED] nabumetone (RELAFEN) 750 MG tablet TAKE 1 TABLET BY MOUTH DAILY. (Patient not taking: Reported on 09/22/2022)   [DISCONTINUED] omeprazole (PRILOSEC) 20 MG capsule Take 2 capsules (40 mg total) by mouth daily. (Patient not taking: Reported on 09/22/2022)   [DISCONTINUED] pioglitazone (ACTOS) 30 MG tablet TAKE 1 TABLET DAILY (Patient not taking: Reported on 09/22/2022)   [DISCONTINUED] predniSONE (DELTASONE) 20 MG tablet 2 po at sametime daily for 5 days (Patient not taking: Reported on 09/22/2022)   [DISCONTINUED]  Probiotic Product (PROBIOTIC PEARLS PO) Take 1 capsule by mouth daily. (Patient not taking: Reported on 09/22/2022)   No facility-administered encounter medications on file as of 09/22/2022.    Past Medical History:  Diagnosis Date   Anxiety    Arthritis    Back pain    Bipolar disorder (HCC)    Depression    Eating disorder    Frozen shoulder    GERD (gastroesophageal reflux disease)    Leukocytosis 07/02/2015   Neuromuscular disorder (HCC)    Fibromyalgia   OCD (obsessive compulsive disorder)    Panic disorder    Vaginal Pap smear, abnormal    Vocal cord polyps     Past Surgical History:  Procedure Laterality Date   DIAGNOSTIC LAPAROSCOPY     [place]   LAPAROSCOPIC TUBAL LIGATION  10/30/2011   Procedure: LAPAROSCOPIC TUBAL LIGATION;  Surgeon: [name], MD;  Location: [facility];  Service: Gynecology;  Laterality: Bilateral;  Laparoscopic Bilateral tubal ligation with falope rings   lt shoulder surgery     TONSILLECTOMY     TUBAL LIGATION  10/04/2011    Family History  Problem Relation Age of Onset   Hyperlipidemia Mother    Hypertension Mother  Hyperlipidemia Father    Alcohol abuse Father    Bipolar disorder Sister    Cancer Maternal Grandmother        breast cancer   Heart disease Maternal Grandfather 61    Social History   Socioeconomic History   Marital status: Legally Separated    Spouse name: Not on file   Number of children: Not on file   Years of education: Not on file   Highest education level: Not on file  Occupational History   Not on file  Tobacco Use   Smoking status: Every Day    Current packs/day: 0.50    Average packs/day: 0.5 packs/day for 30.0 years (15.0 ttl pk-yrs)    Types: Cigarettes   Smokeless tobacco: Never  Vaping Use   Vaping status: Never Used  Substance and Sexual Activity   Alcohol use: No   Drug use: No    Types: Marijuana   Sexual activity: Yes    Birth control/protection: None, Post-menopausal  Other Topics  Concern   Not on file  Social History Narrative   Not on file   Social Determinants of Health   Financial Resource Strain: Low Risk  (09/22/2022)   Overall Financial Resource Strain (CARDIA)    Difficulty of Paying Living Expenses: Not hard at all  Food Insecurity: No Food Insecurity (09/22/2022)   Hunger Vital Sign    Worried About Running Out of Food in the Last Year: Never true    Ran Out of Food in the Last Year: Never true  Transportation Needs: No Transportation Needs (09/22/2022)   PRAPARE - Administrator, Civil Service (Medical): No    Lack of Transportation (Non-Medical): No  Physical Activity: Not on file  Stress: Not on file  Social Connections: Not on file  Intimate Partner Violence: Not At Risk (09/22/2022)   Humiliation, Afraid, Rape, and Kick questionnaire    Fear of Current or Ex-Partner: No    Emotionally Abused: No    Physically Abused: No    Sexually Abused: No    Review of Systems  Constitutional:  Negative for chills and fever.  HENT:  Negative for congestion and sore throat.   Eyes:  Negative for blurred vision and redness.  Respiratory:  Negative for cough and shortness of breath.   Cardiovascular:  Negative for chest pain and leg swelling.  Gastrointestinal:  Negative for constipation, nausea and vomiting.  Genitourinary:  Negative for dysuria, frequency and hematuria.  Musculoskeletal:  Negative for falls and myalgias.  Skin:  Negative for itching and rash.  Neurological:  Negative for dizziness, weakness and headaches.  Endo/Heme/Allergies:  Negative for environmental allergies and polydipsia. Does not bruise/bleed easily.  Psychiatric/Behavioral:  Negative for suicidal ideas. The patient does not have insomnia.    Negative unless indicated in HPI   Objective    BP 117/84   Pulse (!) 103   Temp 98.1 F (36.7 C) (Temporal)   Ht 5\' 3"  (1.6 m)   Wt 157 lb 12.8 oz (71.6 kg)   LMP 09/03/2011   SpO2 96%   BMI 27.95 kg/m   Physical  Exam Vitals and nursing note reviewed.  Constitutional:      Appearance: Normal appearance.  HENT:     Head: Normocephalic and atraumatic.  Eyes:     Extraocular Movements: Extraocular movements intact.     Conjunctiva/sclera: Conjunctivae normal.     Pupils: Pupils are equal, round, and reactive to light.  Cardiovascular:     Rate  and Rhythm: Normal rate and regular rhythm.  Pulmonary:     Effort: Pulmonary effort is normal.     Breath sounds: Normal breath sounds.  Abdominal:     General: Bowel sounds are normal. There is distension.     Palpations: Abdomen is soft. There is no mass.     Tenderness: There is no abdominal tenderness. There is no guarding or rebound.  Musculoskeletal:     Cervical back: Normal range of motion and neck supple.  Skin:    General: Skin is warm and dry.     Coloration: Skin is not jaundiced.     Findings: No rash.  Neurological:     Mental Status: She is alert and oriented to person, place, and time. Mental status is at baseline.  Psychiatric:        Mood and Affect: Mood normal.        Behavior: Behavior normal.        Thought Content: Thought content normal.     Last CBC Lab Results  Component Value Date   WBC 8.8 09/22/2022   HGB 15.7 09/22/2022   HCT 47.4 (H) 09/22/2022   MCV 97 09/22/2022   MCH 32.1 09/22/2022   RDW 12.3 09/22/2022   PLT 136 (L) 09/22/2022   Last metabolic panel Lab Results  Component Value Date   GLUCOSE WILL FOLLOW 09/22/2022   NA 130 (L) 09/22/2022   K 4.5 09/22/2022   CL 89 (L) 09/22/2022   CO2 18 (L) 09/22/2022   BUN 6 09/22/2022   CREATININE 0.69 09/22/2022   GFRNONAA 106 01/16/2019   CALCIUM WILL FOLLOW 09/22/2022   PROT 6.7 09/22/2022   ALBUMIN 4.1 09/22/2022   LABGLOB 2.6 09/22/2022   AGRATIO 2.0 01/16/2019   BILITOT <0.2 09/22/2022   ALKPHOS 198 (H) 09/22/2022   AST WILL FOLLOW 09/22/2022   ALT WILL FOLLOW 09/22/2022   Last lipids Lab Results  Component Value Date   CHOL 242 (H)  09/22/2022   HDL 20 (L) 09/22/2022   LDLCALC Comment (A) 09/22/2022   TRIG 1,793 (HH) 09/22/2022   CHOLHDL 12.1 (H) 09/22/2022   Last hemoglobin A1c Lab Results  Component Value Date   HGBA1C 11.7 (H) 09/22/2022   Last thyroid functions Lab Results  Component Value Date   TSH 1.670 09/22/2022   T4TOTAL 8.6 09/22/2022        Assessment & Plan:  Hyperlipidemia associated with type 2 diabetes mellitus (HCC) -     Lipid panel  Hypertension associated with diabetes (HCC) -     CBC with Differential/Platelet -     CMP14+EGFR  Pulmonary HTN (HCC) -     Atenolol; Take 1 tablet (25 mg total) by mouth at bedtime.  Dispense: 90 tablet; Refill: 0  Gastroesophageal reflux disease, unspecified whether esophagitis present -     Pantoprazole Sodium; Take 1 tablet (40 mg total) by mouth daily.  Dispense: 90 tablet; Refill: 0  Routine medical exam -     Thyroid Panel With TSH -     Hepatitis C antibody  Tobacco smoker, 1 pack of cigarettes or less per day -     Ambulatory referral to Pulmonology  Tobacco abuse counseling  Screening mammogram for breast cancer  Moderate episode of recurrent major depressive disorder (HCC) -     Lurasidone HCl; TAKE 1 TABLET WITH BREAKFAST  Dispense: 90 tablet; Refill: 0  Bipolar affective disorder, current episode depressed, current episode severity unspecified (HCC) -     Ambulatory  referral to Psychiatry  Need for shingles vaccine -     Varicella-zoster vaccine IM  Type II diabetes mellitus with peripheral circulatory disorder (HCC) -     Bayer DCA Hb A1c Waived -     Microalbumin / creatinine urine ratio -     Cetirizine HCl; Take 1 tablet (10 mg total) by mouth daily.  Dispense: 90 tablet; Refill: 0 -     Trulicity; Inject 1.5 mg into the skin every 7 (seven) days.  Dispense: 0.5 mL; Refill: 2 -     Ezetimibe; Take 1 tablet (10 mg total) by mouth daily.  Dispense: 90 tablet; Refill: 0 -     glipiZIDE ER; Take 1 tablet (5 mg total) by  mouth daily with breakfast.  Dispense: 90 tablet; Refill: 0 -     Hemoglobin A1c; Future -     FreeStyle Libre 3 Sensor; 1 each by Does not apply route every 14 (fourteen) days. Place 1 sensor on the skin every 14 days. Use to check glucose continuously  Dispense: 2 each; Refill: 2 -     FreeStyle Libre 3 Reader; 1 each by Does not apply route daily.  Dispense: 1 each; Refill: 0  Long term current use of oral hypoglycemic drug  Long-term current use of injectable noninsulin antidiabetic medication  Other orders -     Sertraline HCl; Take 1 tablet (50 mg total) by mouth daily.  Dispense: 90 tablet; Refill: 0   [place] is 57 yrs old caucasian female, no acute distress  DM2: A1C not goal, Restart trulicity, Zeti, Glipizide, CBG monitor Micro ordered today- diabetic foot exam completed -need to make appointment for diabetic eye exam HTN: Continue Atenolol 25 , refilled provided Hyperlipidemia: Continue atorvastatin 40 mg, refills provided Referral to psychiatry, Latuda an d Zoloft refilled Labs: CBC, CMP, Lipid, TSH, micro, Hep-c Shingles vaccine adinistered  Continue healthy lifestyle choices, including diet (rich in fruits, vegetables, and lean proteins, and low in salt and simple carbohydrates) and exercise (at least 30 minutes of moderate physical activity daily).     The above assessment and management plan was discussed with the patient. The patient verbalized understanding of and has agreed to the management plan. Patient is aware to call the clinic if they develop any new symptoms or if symptoms persist or worsen. Patient is aware when to return to the clinic for a follow-up visit. Patient educated on when it is appropriate to go to the emergency department. gle vaccine administered  Return in about 3 months (around 12/23/2022).   Arrie Aran Santa Lighter, DNP [facility] [address] [place], [place] [address] [phone]

## 2022-09-23 ENCOUNTER — Telehealth: Payer: Self-pay | Admitting: Nurse Practitioner

## 2022-09-23 ENCOUNTER — Other Ambulatory Visit: Payer: Self-pay | Admitting: Nurse Practitioner

## 2022-09-23 DIAGNOSIS — E782 Mixed hyperlipidemia: Secondary | ICD-10-CM

## 2022-09-23 DIAGNOSIS — Z7984 Long term (current) use of oral hypoglycemic drugs: Secondary | ICD-10-CM

## 2022-09-23 DIAGNOSIS — Z7985 Long-term (current) use of injectable non-insulin antidiabetic drugs: Secondary | ICD-10-CM | POA: Insufficient documentation

## 2022-09-23 DIAGNOSIS — E1151 Type 2 diabetes mellitus with diabetic peripheral angiopathy without gangrene: Secondary | ICD-10-CM

## 2022-09-23 LAB — MICROALBUMIN / CREATININE URINE RATIO
Creatinine, Urine: 15.6 mg/dL
Microalb/Creat Ratio: 44 mg/g{creat} — ABNORMAL HIGH (ref 0–29)
Microalbumin, Urine: 6.8 ug/mL

## 2022-09-23 MED ORDER — FISH OIL 1200 MG PO CPDR
1.0000 | DELAYED_RELEASE_CAPSULE | Freq: Every day | ORAL | 0 refills | Status: DC
Start: 2022-09-23 — End: 2023-09-29

## 2022-09-23 MED ORDER — KERENDIA 10 MG PO TABS
1.0000 | ORAL_TABLET | Freq: Every day | ORAL | 0 refills | Status: DC
Start: 2022-09-23 — End: 2022-12-23

## 2022-09-23 MED ORDER — GEMFIBROZIL 600 MG PO TABS
600.0000 mg | ORAL_TABLET | Freq: Two times a day (BID) | ORAL | 0 refills | Status: DC
Start: 2022-09-23 — End: 2023-04-01

## 2022-09-23 MED ORDER — ATORVASTATIN CALCIUM 80 MG PO TABS
80.0000 mg | ORAL_TABLET | Freq: Every day | ORAL | 0 refills | Status: DC
Start: 2022-09-23 — End: 2022-12-23

## 2022-09-23 NOTE — Telephone Encounter (Signed)
Call client to discuss recent labs explains to her that Atorvastatin will be increased 80 mg and Gemfibrozil will added as well to lower her Trig.  Lipid Panel     Component Value Date/Time   CHOL 242 (H) 09/22/2022 0907   CHOL 248 (H) 08/23/2012 1227   TRIG 1,793 (HH) 09/22/2022 0907   TRIG 253 (H) 08/23/2012 1227   HDL 20 (L) 09/22/2022 0907   HDL 33 (L) 08/23/2012 1227   CHOLHDL 12.1 (H) 09/22/2022 0907   LDLCALC Comment (A) 09/22/2022 0907   LDLCALC 164 (H) 08/23/2012 1227   LABVLDL CANCELED 09/22/2022 0907    Diet encouraged - increase intake of fresh fruits and vegetables, increase intake of lean proteins. Bake, broil, or grill foods. Avoid fried, greasy, and fatty foods. Avoid fast foods. Increase intake of fiber-rich whole grains. Exercise encouraged - at least 150 minutes per week and advance as tolerated.  Goal BMI < 25. Continue medications as prescribed. Follow up in 3-6 months as discussed.

## 2022-09-24 LAB — CMP14+EGFR
ALT: 19 IU/L (ref 0–32)
AST: 19 IU/L (ref 0–40)
Albumin: 4.1 g/dL (ref 3.8–4.9)
Alkaline Phosphatase: 198 IU/L — ABNORMAL HIGH (ref 44–121)
BUN/Creatinine Ratio: 9 (ref 9–23)
BUN: 6 mg/dL (ref 6–24)
Bilirubin Total: <0.2 mg/dL (ref 0.0–1.2)
CO2: 18 mmol/L — ABNORMAL LOW (ref 20–29)
Calcium: 7.3 mg/dL — ABNORMAL LOW (ref 8.7–10.2)
Chloride: 89 mmol/L — ABNORMAL LOW (ref 96–106)
Creatinine, Ser: 0.69 mg/dL (ref 0.57–1.00)
Globulin, Total: 2.6 g/dL (ref 1.5–4.5)
Glucose: 561 mg/dL (ref 70–99)
Potassium: 4.5 mmol/L (ref 3.5–5.2)
Sodium: 130 mmol/L — ABNORMAL LOW (ref 134–144)
Total Protein: 6.7 g/dL (ref 6.0–8.5)
eGFR: 102 mL/min/{1.73_m2} (ref >59)

## 2022-09-24 LAB — CBC WITH DIFFERENTIAL/PLATELET
Basophils Absolute: 0.1 x10E3/uL (ref 0.0–0.2)
Basos: 1 %
EOS (ABSOLUTE): 0.1 x10E3/uL (ref 0.0–0.4)
Eos: 1 %
Hematocrit: 47.4 % — ABNORMAL HIGH (ref 34.0–46.6)
Hemoglobin: 15.7 g/dL (ref 11.1–15.9)
Immature Grans (Abs): 0 x10E3/uL (ref 0.0–0.1)
Immature Granulocytes: 0 %
Lymphocytes Absolute: 2 x10E3/uL (ref 0.7–3.1)
Lymphs: 23 %
MCH: 32.1 pg (ref 26.6–33.0)
MCHC: 33.1 g/dL (ref 31.5–35.7)
MCV: 97 fL (ref 79–97)
Monocytes Absolute: 0.4 x10E3/uL (ref 0.1–0.9)
Monocytes: 4 %
Neutrophils Absolute: 6.2 x10E3/uL (ref 1.4–7.0)
Neutrophils: 71 %
Platelets: 136 x10E3/uL — ABNORMAL LOW (ref 150–450)
RBC: 4.89 x10E6/uL (ref 3.77–5.28)
RDW: 12.3 % (ref 11.7–15.4)
WBC: 8.8 x10E3/uL (ref 3.4–10.8)

## 2022-09-24 LAB — THYROID PANEL WITH TSH
Free Thyroxine Index: 2.5 (ref 1.2–4.9)
T3 Uptake Ratio: 29 % (ref 24–39)
T4, Total: 8.6 ug/dL (ref 4.5–12.0)
TSH: 1.67 u[IU]/mL (ref 0.450–4.500)

## 2022-09-24 LAB — LIPID PANEL
Chol/HDL Ratio: 12.1 ratio — ABNORMAL HIGH (ref 0.0–4.4)
Cholesterol, Total: 242 mg/dL — ABNORMAL HIGH (ref 100–199)
HDL: 20 mg/dL — ABNORMAL LOW (ref >39)
Triglycerides: 1793 mg/dL (ref 0–149)

## 2022-09-24 LAB — HEPATITIS C ANTIBODY: Hep C Virus Ab: NONREACTIVE

## 2022-09-29 ENCOUNTER — Other Ambulatory Visit: Payer: Self-pay

## 2022-09-29 DIAGNOSIS — Z1231 Encounter for screening mammogram for malignant neoplasm of breast: Secondary | ICD-10-CM

## 2022-10-01 ENCOUNTER — Telehealth: Payer: Self-pay

## 2022-10-01 ENCOUNTER — Other Ambulatory Visit (HOSPITAL_COMMUNITY): Payer: Self-pay

## 2022-10-01 NOTE — Telephone Encounter (Signed)
 Clinical questions answered and PA submitted

## 2022-10-01 NOTE — Telephone Encounter (Signed)
Pharmacy Patient Advocate Encounter   Received notification from CoverMyMeds that prior authorization for Lurasidone 40mg  is required/requested.   Insurance verification completed.   The patient is insured through [facility] .   Per test claim: PA required; PA started via CoverMyMeds. KEY [id number] . Waiting for clinical questions to populate.

## 2022-10-02 NOTE — Telephone Encounter (Signed)
Pharmacy Patient Advocate Encounter  Received notification from [facility] that Prior Authorization for Lurasidone 40mg  tabs has been APPROVED from 10/01/22 to 10/01/23   PA #/Case ID/Reference #: [id number]  Approval letter indexed to media tab

## 2022-10-08 ENCOUNTER — Telehealth: Payer: Self-pay

## 2022-10-08 ENCOUNTER — Other Ambulatory Visit (HOSPITAL_COMMUNITY): Payer: Self-pay

## 2022-10-08 NOTE — Telephone Encounter (Signed)
Pharmacy Patient Advocate Encounter  Received notification from [facility] that Prior Authorization for Trulicity 1.5MG /0.5ML pen-injectors has been APPROVED from 10/08/22 to 10/08/23   PA #/Case ID/Reference #: [id number]

## 2022-10-08 NOTE — Telephone Encounter (Signed)
Pharmacy Patient Advocate Encounter   Received notification from CoverMyMeds that prior authorization for Trulicity 1.5MG /0.5ML pen-injectors is required/requested.   Insurance verification completed.   The patient is insured through [facility] .   Per test claim: PA required; PA started via CoverMyMeds. KEY [id number] . Waiting for clinical questions to populate.

## 2022-10-08 NOTE — Telephone Encounter (Signed)
 Clinical questions answered. PA submitted

## 2022-10-13 ENCOUNTER — Ambulatory Visit (INDEPENDENT_AMBULATORY_CARE_PROVIDER_SITE_OTHER): Payer: Medicaid Other

## 2022-10-13 DIAGNOSIS — E1140 Type 2 diabetes mellitus with diabetic neuropathy, unspecified: Secondary | ICD-10-CM | POA: Diagnosis not present

## 2022-10-13 LAB — HM DIABETES EYE EXAM

## 2022-10-13 NOTE — Progress Notes (Signed)
Penny Moreno arrived 10/13/2022 and has given verbal consent to obtain images and complete their overdue diabetic retinal screening.  The images have been sent to an ophthalmologist or optometrist for review and interpretation.  Results will be sent back to [place], Dois Davenport, NP for review.  Patient has been informed they will be contacted when we receive the results via telephone or MyChart

## 2022-10-20 ENCOUNTER — Encounter: Payer: Self-pay | Admitting: Family Medicine

## 2022-10-20 ENCOUNTER — Telehealth: Payer: Medicaid Other | Admitting: Family Medicine

## 2022-10-20 DIAGNOSIS — N760 Acute vaginitis: Secondary | ICD-10-CM | POA: Diagnosis not present

## 2022-10-20 MED ORDER — FLUCONAZOLE 150 MG PO TABS: 150.0000 mg | ORAL_TABLET | Freq: Every day | ORAL | 0 refills | Status: DC

## 2022-10-20 NOTE — Progress Notes (Signed)
Subjective:    Patient ID: Penny Moreno, female    DOB: 1965/03/15, 57 y.o.   MRN: [id number]   HPI: Penny Moreno is a 57 y.o. female presenting for yeast infection with burning itching vaginally. Onset 2 days ago after finishing PCN 2 weeks ago. She has frequently required multiple treatments with diflucan to clear them.       09/22/2022    9:34 AM 01/16/2019    2:29 PM 10/26/2018    1:57 PM 03/22/2018   10:31 AM 12/22/2017   10:51 AM  Depression screen PHQ 2/9  Decreased Interest 3 0 2 2 2   Down, Depressed, Hopeless 2 0 0 1 2  PHQ - 2 Score 5 0 2 3 4   Altered sleeping 2  1 0 0  Tired, decreased energy 3  1 1 1   Change in appetite 2  0 0 0  Feeling bad or failure about yourself  2  1 1 1   Trouble concentrating 3  1 3  0  Moving slowly or fidgety/restless 1  0 0 0  Suicidal thoughts 2  0 0 0  PHQ-9 Score 20  6 8 6   Difficult doing work/chores Extremely dIfficult         Relevant past medical, surgical, family and social history reviewed and updated as indicated.  Interim medical history since our last visit reviewed. Allergies and medications reviewed and updated.  ROS:  Review of Systems  Constitutional: Negative.  Negative for activity change.  HENT: Negative.    Gastrointestinal: Negative.   Genitourinary:  Positive for vaginal discharge and vaginal pain. Negative for dysuria and vaginal bleeding.  Musculoskeletal:  Negative for arthralgias.     Social History   Tobacco Use  Smoking Status Every Day   Current packs/day: 0.50   Average packs/day: 0.5 packs/day for 30.0 years (15.0 ttl pk-yrs)   Types: Cigarettes  Smokeless Tobacco Never       Objective:     Wt Readings from Last 3 Encounters:  09/22/22 157 lb 12.8 oz (71.6 kg)  01/16/19 152 lb (68.9 kg)  03/22/18 151 lb (68.5 kg)     Exam deferred. Video visit performed.   Assessment & Plan:   1. Acute vaginitis     Meds ordered this encounter  Medications   fluconazole (DIFLUCAN)  150 MG tablet    Sig: Take 1 tablet (150 mg total) by mouth daily for 5 doses. At onset of symptoms. Repeat at end of treatment    Dispense:  5 tablet    Refill:  0    No orders of the defined types were placed in this encounter.     Diagnoses and all orders for this visit:  Acute vaginitis  Other orders -     fluconazole (DIFLUCAN) 150 MG tablet; Take 1 tablet (150 mg total) by mouth daily for 5 doses. At onset of symptoms. Repeat at end of treatment    Virtual Visit  Note  I discussed the limitations, risks, security and privacy concerns of performing an evaluation and management service by video and the availability of in person appointments. The patient was identified with two identifiers. Pt.expressed understanding and agreed to proceed. Pt. Is at home. Dr. Darlyn Read is in his office.  Follow Up Instructions:   I discussed the assessment and treatment plan with the patient. The patient was provided an opportunity to ask questions and all were answered. The patient agreed with the plan and demonstrated an understanding of the  instructions.   The patient was advised to call back or seek an in-person evaluation if the symptoms worsen or if the condition fails to improve as anticipated.   Total minutes contact time: 12   Follow up plan: Return if symptoms worsen or fail to improve.  Mechele Claude, MD [facility]

## 2022-10-28 DIAGNOSIS — Z5181 Encounter for therapeutic drug level monitoring: Secondary | ICD-10-CM | POA: Diagnosis not present

## 2022-10-28 DIAGNOSIS — F332 Major depressive disorder, recurrent severe without psychotic features: Secondary | ICD-10-CM | POA: Diagnosis not present

## 2022-12-22 ENCOUNTER — Other Ambulatory Visit: Payer: Self-pay | Admitting: Nurse Practitioner

## 2022-12-22 DIAGNOSIS — I272 Pulmonary hypertension, unspecified: Secondary | ICD-10-CM

## 2022-12-22 DIAGNOSIS — K219 Gastro-esophageal reflux disease without esophagitis: Secondary | ICD-10-CM

## 2022-12-22 DIAGNOSIS — Z0000 Encounter for general adult medical examination without abnormal findings: Secondary | ICD-10-CM

## 2022-12-22 DIAGNOSIS — Z7985 Long-term (current) use of injectable non-insulin antidiabetic drugs: Secondary | ICD-10-CM

## 2022-12-22 DIAGNOSIS — Z7984 Long term (current) use of oral hypoglycemic drugs: Secondary | ICD-10-CM

## 2022-12-22 DIAGNOSIS — E782 Mixed hyperlipidemia: Secondary | ICD-10-CM

## 2022-12-22 DIAGNOSIS — E1151 Type 2 diabetes mellitus with diabetic peripheral angiopathy without gangrene: Secondary | ICD-10-CM

## 2022-12-24 ENCOUNTER — Encounter: Payer: Self-pay | Admitting: Nurse Practitioner

## 2022-12-24 ENCOUNTER — Ambulatory Visit: Payer: Medicaid Other | Admitting: Nurse Practitioner

## 2022-12-24 VITALS — BP 118/66 | HR 105 | Temp 97.7°F | Ht 63.0 in | Wt 154.6 lb

## 2022-12-24 DIAGNOSIS — M797 Fibromyalgia: Secondary | ICD-10-CM

## 2022-12-24 DIAGNOSIS — Z7985 Long-term (current) use of injectable non-insulin antidiabetic drugs: Secondary | ICD-10-CM

## 2022-12-24 DIAGNOSIS — E782 Mixed hyperlipidemia: Secondary | ICD-10-CM | POA: Diagnosis not present

## 2022-12-24 DIAGNOSIS — Z23 Encounter for immunization: Secondary | ICD-10-CM

## 2022-12-24 DIAGNOSIS — G894 Chronic pain syndrome: Secondary | ICD-10-CM | POA: Diagnosis not present

## 2022-12-24 DIAGNOSIS — N898 Other specified noninflammatory disorders of vagina: Secondary | ICD-10-CM

## 2022-12-24 DIAGNOSIS — E1140 Type 2 diabetes mellitus with diabetic neuropathy, unspecified: Secondary | ICD-10-CM | POA: Diagnosis not present

## 2022-12-24 DIAGNOSIS — M4317 Spondylolisthesis, lumbosacral region: Secondary | ICD-10-CM

## 2022-12-24 LAB — BAYER DCA HB A1C WAIVED: HB A1C (BAYER DCA - WAIVED): 12.8 % — ABNORMAL HIGH (ref 4.8–5.6)

## 2022-12-24 LAB — WET PREP FOR TRICH, YEAST, CLUE
Clue Cell Exam: NEGATIVE
Trichomonas Exam: NEGATIVE
Yeast Exam: POSITIVE — AB

## 2022-12-24 MED ORDER — CYCLOBENZAPRINE HCL 10 MG PO TABS: 10.0000 mg | ORAL_TABLET | Freq: Three times a day (TID) | ORAL | 5 refills | Status: DC

## 2022-12-24 MED ORDER — TRULICITY 3 MG/0.5ML ~~LOC~~ SOAJ: 3.0000 mg | SUBCUTANEOUS | 3 refills | Status: DC

## 2022-12-24 NOTE — Progress Notes (Signed)
Established Patient Office Visit  Subjective   Patient ID: Penny Moreno, female    DOB: Aug 07, 1965  Age: 57 y.o. MRN: [id number]  Chief Complaint  Patient presents with   Medical Management of Chronic Issues    3 month    HPI Penny Moreno 57 year old female present today for 2 months follow-up for chronic disease management and new concern for possible yeast infection BV new problem Presenting with a 14-days history of vaginal itching, burning, and a thick, white, cottage cheese-like discharge. The symptoms began over 3 weeks ago ago and have worsened, especially at night, with discomfort during urination . The discharge is odorless and non-bloody. The patient has a history of recurrent yeast infections and has treated similar symptoms with over-the-counter antifungals in the past. No recent antibiotic use, changes in hygiene, or new sexual partners. Denies fever, chills, or abdominal pain.  Educated client the importance of taking the prescribed medication to avoid cardiac complications she agreed to start taking it as prescribed.  Hypertension, follow-up  BP Readings from Last 3 Encounters:  12/24/22 118/66  09/22/22 117/84  01/16/19 (!) 124/59   Wt Readings from Last 3 Encounters:  12/24/22 154 lb 9.6 oz (70.1 kg)  09/22/22 157 lb 12.8 oz (71.6 kg)  01/16/19 152 lb (68.9 kg)     She was last seen for hypertension 3 months ago.  BP at that visit was 117/84. Management since that visit includes atenolol 25 mg.  She reports excellent compliance with treatment. She is not having side effects.  She is following a Regular,    diet. She is not exercising. She does smoke.  Pack a day not ready to quit Use of agents associated with hypertension: none.  Outside blood pressures are . Symptoms: No chest pain No chest pressure  No palpitations No syncope  No dyspnea No orthopnea  No paroxysmal nocturnal dyspnea No lower extremity edema   Pertinent labs Lab Results   Component Value Date   CHOL 242 (H) 09/22/2022   HDL 20 (L) 09/22/2022   LDLCALC Comment (A) 09/22/2022   TRIG 1,793 (HH) 09/22/2022   CHOLHDL 12.1 (H) 09/22/2022   Lab Results  Component Value Date   NA 130 (L) 09/22/2022   K 4.5 09/22/2022   CREATININE 0.69 09/22/2022   EGFR 102 09/22/2022   GLUCOSE 561 (HH) 09/22/2022   TSH 1.670 09/22/2022     The 10-year ASCVD risk score (Arnett DK, et al., 2019) is: 29.9%  Hyperlipidemia: Client has a diagnosis of hyperlipidemia and was prescribed Lopid at her last visit which she reports she has not been taking "I do not  like taking medication that I do not know. .  Her last labs showed Total cholesterol of 242, HDL 20, LDL unable to calculate,  Triglycerides 1793. negative. There is a family history of hyperlipidemia. There is a family history of early ischemia heart disease.   Diabetes Mellitus Type II, Follow-up  Lab Results  Component Value Date   HGBA1C 11.7 (H) 09/22/2022   HGBA1C 9.1 (H) 01/16/2019   HGBA1C 10.6 (H) 11/07/2018   Wt Readings from Last 3 Encounters:  12/24/22 154 lb 9.6 oz (70.1 kg)  09/22/22 157 lb 12.8 oz (71.6 kg)  01/16/19 152 lb (68.9 kg)   Last seen for diabetes 3 months ago.  Management since then includes Trulicity, glipizide. She reports poor compliance with treatment.  A1c today 12.8 She is not having side effects.  Symptoms: No fatigue No foot ulcerations  No appetite changes No nausea  No paresthesia of the feet  No polydipsia  No polyuria No visual disturbances   No vomiting    Home blood sugar records:  Not being recorded  Episodes of hypoglycemia? No  Current insulin regiment: Not on insulin:1} Most Recent Eye Exam: Less than a year ago historical data from patient Current exercise: none Current diet habits: Regular diet  Pertinent Labs: Lab Results  Component Value Date   CHOL 242 (H) 09/22/2022   HDL 20 (L) 09/22/2022   LDLCALC Comment (A) 09/22/2022   TRIG 1,793 (HH)  09/22/2022   CHOLHDL 12.1 (H) 09/22/2022   Lab Results  Component Value Date   NA 130 (L) 09/22/2022   K 4.5 09/22/2022   CREATININE 0.69 09/22/2022   EGFR 102 09/22/2022   MICRALBCREAT 44 (H) 09/22/2022     ---------------------------------------------------------------------------------------------------  Patient Active Problem List   Diagnosis Date Noted   Vaginal discharge 12/24/2022   Mixed hypertriglyceridemia 09/23/2022   Long term current use of oral hypoglycemic drug 09/23/2022   Long-term current use of injectable noninsulin antidiabetic medication 09/23/2022   Screening mammogram for breast cancer 09/22/2022   Tobacco abuse counseling 09/22/2022   Need for shingles vaccine 09/22/2022   Routine medical exam 09/22/2022   Tobacco smoker, 1 pack of cigarettes or less per day 09/22/2022   Chronic pain syndrome 12/31/2021   Gastroesophageal reflux disease without esophagitis 06/04/2020   Neuropathy 05/30/2020   Spondylolisthesis at L5-S1 level 11/20/2019   Pulmonary HTN (HCC) 11/01/2019   Type II diabetes mellitus with peripheral circulatory disorder (HCC) 04/04/2019   Posttraumatic stress disorder 04/04/2019   Generalized anxiety disorder 04/04/2019   Borderline personality disorder (HCC) 04/04/2019   Allergic rhinitis 04/04/2019   Bipolar disorder (HCC) 04/04/2019   Mixed hyperlipidemia 04/04/2019   Osteoarthritis 04/04/2019   Cervical somatic dysfunction 07/19/2018   Gastroesophageal reflux disease 12/25/2017   PTSD (post-traumatic stress disorder) 08/13/2017   Type 2 diabetes mellitus with diabetic neuropathy, unspecified (HCC) 11/04/2016   Hypertension associated with diabetes (HCC) 07/16/2016   Leukocytosis 07/02/2015   CIN I (cervical intraepithelial neoplasia I) 11/10/2014   Hyperlipidemia associated with type 2 diabetes mellitus (HCC) 10/02/2014   GAD (generalized anxiety disorder) 10/02/2014   Fibromyalgia 10/02/2014   Pap smear abnormality of cervix with  LGSIL 09/26/2014   Osteopenia 09/06/2013   OA (osteoarthritis) 02/10/2013   Rheumatoid factor positive 12/07/2012   Low back pain 12/07/2012   Depression 07/27/2012   Encounter for sterilization 10/30/2011   Past Medical History:  Diagnosis Date   Anxiety    Arthritis    Back pain    Bipolar disorder (HCC)    Depression    Eating disorder    Frozen shoulder    GERD (gastroesophageal reflux disease)    Leukocytosis 07/02/2015   Neuromuscular disorder (HCC)    Fibromyalgia   OCD (obsessive compulsive disorder)    Panic disorder    Vaginal Pap smear, abnormal    Vocal cord polyps    Past Surgical History:  Procedure Laterality Date   DIAGNOSTIC LAPAROSCOPY     [place]   LAPAROSCOPIC TUBAL LIGATION  10/30/2011   Procedure: LAPAROSCOPIC TUBAL LIGATION;  Surgeon: [name], MD;  Location: [facility];  Service: Gynecology;  Laterality: Bilateral;  Laparoscopic Bilateral tubal ligation with falope rings   lt shoulder surgery     TONSILLECTOMY     TUBAL LIGATION  10/04/2011   Social History   Tobacco Use   Smoking status: Every  Day    Current packs/day: 0.50    Average packs/day: 0.5 packs/day for 30.0 years (15.0 ttl pk-yrs)    Types: Cigarettes   Smokeless tobacco: Never  Vaping Use   Vaping status: Never Used  Substance Use Topics   Alcohol use: No   Drug use: No    Types: Marijuana   Social History   Socioeconomic History   Marital status: Legally Separated    Spouse name: Not on file   Number of children: Not on file   Years of education: Not on file   Highest education level: Associate degree: occupational, Scientist, product/process development, or vocational program  Occupational History   Not on file  Tobacco Use   Smoking status: Every Day    Current packs/day: 0.50    Average packs/day: 0.5 packs/day for 30.0 years (15.0 ttl pk-yrs)    Types: Cigarettes   Smokeless tobacco: Never  Vaping Use   Vaping status: Never Used  Substance and Sexual Activity   Alcohol use: No    Drug use: No    Types: Marijuana   Sexual activity: Yes    Birth control/protection: None, Post-menopausal  Other Topics Concern   Not on file  Social History Narrative   Not on file   Social Determinants of Health   Financial Resource Strain: Medium Risk (12/23/2022)   Overall Financial Resource Strain (CARDIA)    Difficulty of Paying Living Expenses: Somewhat hard  Food Insecurity: No Food Insecurity (12/23/2022)   Hunger Vital Sign    Worried About Running Out of Food in the Last Year: Never true    Ran Out of Food in the Last Year: Never true  Transportation Needs: No Transportation Needs (12/23/2022)   PRAPARE - Administrator, Civil Service (Medical): No    Lack of Transportation (Non-Medical): No  Physical Activity: Unknown (12/23/2022)   Exercise Vital Sign    Days of Exercise per Week: 0 days    Minutes of Exercise per Session: Not on file  Stress: Stress Concern Present (12/23/2022)   [employer] of Occupational Health - Occupational Stress Questionnaire    Feeling of Stress : To some extent  Social Connections: Socially Isolated (12/23/2022)   Social Connection and Isolation Panel [NHANES]    Frequency of Communication with Friends and Family: More than three times a week    Frequency of Social Gatherings with Friends and Family: Twice a week    Attends Religious Services: Never    Database administrator or Organizations: No    Attends Engineer, structural: Not on file    Marital Status: Divorced  Intimate Partner Violence: Not At Risk (09/22/2022)   Humiliation, Afraid, Rape, and Kick questionnaire    Fear of Current or Ex-Partner: No    Emotionally Abused: No    Physically Abused: No    Sexually Abused: No   Family Status  Relation Name Status   Mother  Alive   Father  Alive   Sister  Alive   Brother  Alive   Daughter  Alive   Daughter  Alive   MGM  Deceased at age 20   MGF  Deceased  No partnership data on file   Family  History  Problem Relation Age of Onset   Hyperlipidemia Mother    Hypertension Mother    Hyperlipidemia Father    Alcohol abuse Father    Bipolar disorder Sister    Cancer Maternal Grandmother        breast  cancer   Heart disease Maternal Grandfather 46   Allergies  Allergen Reactions   Tetracyclines & Related Nausea And Vomiting   Latex     Irritates skin    Sulfa Antibiotics Nausea And Vomiting      ROS Negative unless indicated in HPI   Objective:     BP 118/66   Pulse (!) 105   Temp 97.7 F (36.5 C) (Temporal)   Ht 5\' 3"  (1.6 m)   Wt 154 lb 9.6 oz (70.1 kg)   LMP 09/03/2011   SpO2 96%   BMI 27.39 kg/m  BP Readings from Last 3 Encounters:  12/24/22 118/66  09/22/22 117/84  01/16/19 (!) 124/59   Wt Readings from Last 3 Encounters:  12/24/22 154 lb 9.6 oz (70.1 kg)  09/22/22 157 lb 12.8 oz (71.6 kg)  01/16/19 152 lb (68.9 kg)      Physical Exam Vitals and nursing note reviewed.  Constitutional:      Appearance: Normal appearance.  HENT:     Head: Normocephalic and atraumatic.  Eyes:     General: No scleral icterus.    Extraocular Movements: Extraocular movements intact.     Conjunctiva/sclera: Conjunctivae normal.     Pupils: Pupils are equal, round, and reactive to light.  Cardiovascular:     Rate and Rhythm: Normal rate and regular rhythm.  Pulmonary:     Breath sounds: Normal breath sounds.  Abdominal:     General: Bowel sounds are normal.     Palpations: Abdomen is soft.     Tenderness: There is no right CVA tenderness, left CVA tenderness, guarding or rebound.  Musculoskeletal:        General: Normal range of motion.     Right lower leg: No edema.     Left lower leg: No edema.  Skin:    General: Skin is warm and dry.     Findings: No rash.  Neurological:     Mental Status: She is alert and oriented to person, place, and time. Mental status is at baseline.  Psychiatric:        Mood and Affect: Mood normal.        Behavior: Behavior  normal.        Thought Content: Thought content normal.        Judgment: Judgment normal.     Microscopic wet-mount exam shows positive yeast few bacteria and few epithelial cells.  No results found for any visits on 12/24/22.  Last CBC Lab Results  Component Value Date   WBC 8.8 09/22/2022   HGB 15.7 09/22/2022   HCT 47.4 (H) 09/22/2022   MCV 97 09/22/2022   MCH 32.1 09/22/2022   RDW 12.3 09/22/2022   PLT 136 (L) 09/22/2022   Last metabolic panel Lab Results  Component Value Date   GLUCOSE 561 (HH) 09/22/2022   NA 130 (L) 09/22/2022   K 4.5 09/22/2022   CL 89 (L) 09/22/2022   CO2 18 (L) 09/22/2022   BUN 6 09/22/2022   CREATININE 0.69 09/22/2022   EGFR 102 09/22/2022   CALCIUM 7.3 (L) 09/22/2022   PROT 6.7 09/22/2022   ALBUMIN 4.1 09/22/2022   LABGLOB 2.6 09/22/2022   AGRATIO 2.0 01/16/2019   BILITOT <0.2 09/22/2022   ALKPHOS 198 (H) 09/22/2022   AST 19 09/22/2022   ALT 19 09/22/2022   Last lipids Lab Results  Component Value Date   CHOL 242 (H) 09/22/2022   HDL 20 (L) 09/22/2022   LDLCALC Comment (A) 09/22/2022  TRIG 1,793 (HH) 09/22/2022   CHOLHDL 12.1 (H) 09/22/2022   Last hemoglobin A1c Lab Results  Component Value Date   HGBA1C 11.7 (H) 09/22/2022   Last thyroid functions Lab Results  Component Value Date   TSH 1.670 09/22/2022   T4TOTAL 8.6 09/22/2022        Assessment & Plan:  Mixed hyperlipidemia  Type 2 diabetes mellitus with diabetic neuropathy, without long-term current use of insulin (HCC) -     Bayer DCA Hb A1c Waived  Fibromyalgia  Chronic pain syndrome  Spondylolisthesis at L5-S1 level -     Cyclobenzaprine HCl; Take 1 tablet (10 mg total) by mouth 3 (three) times daily.  Dispense: 90 tablet; Refill: 5  Vaginal discharge -     WET PREP FOR TRICH, YEAST, CLUE  Encounter for immunization -     Flu vaccine trivalent PF, 6mos and older(Flulaval,Afluria,Fluarix,Fluzone) -     Varicella-zoster vaccine IM  Other orders -      Trulicity; Inject 3 mg into the skin every 7 (seven) days.  Dispense: 0.5 mL; Refill: 3  Mashawn is a 57 year old Caucasian female, no acute distress BV: Start Flagyl 500 mg for 7 days #14 dispense, client instructed not to consume alcohol while on the medication Hypertension BP well controlled. Changes no made in regimen HTN. Goal BP is 130/80. Pt aware to report any persistent high or low readings. DASH diet and exercise encouraged. Exercise at least 150 minutes per week and increase as tolerated. Goal BMI > 25. Stress management encouraged. Avoid nicotine and tobacco product use. Avoid excessive alcohol and NSAID's. Avoid more than 2000 mg of sodium daily. Medications as prescribed. Follow up as scheduled.  No medication refill needed today.  Continue atenolol 25 mg daily Hyperlipidemia: Client agreed to start taking Lopid 600 mg twice daily and Lipitor 80 mg daily DM type II: Not well-controlled with Trulicity 1.5 mg/0.5 every 7 days  Plan to increase Trulicity to 3 mg / 0.5 mL every 7 days as client refused to take any insulin and continue glipizide.  Client needs to work on diet and exercise as she refuses referral to diabetes nutritionist  Future: Insulin therapy and possible referral to nutrition Encourage healthy lifestyle choices, including diet (rich in fruits, vegetables, and lean proteins, and low in salt and simple carbohydrates) and exercise (at least 30 minutes of moderate physical activity daily).     The above assessment and management plan was discussed with the patient. The patient verbalized understanding of and has agreed to the management plan. Patient is aware to call the clinic if they develop any new symptoms or if symptoms persist or worsen. Patient is aware when to return to the clinic for a follow-up visit. Patient educated on when it is appropriate to go to the emergency department.  Return in about 3 months (around 03/26/2023).    Arrie Aran Santa Lighter,  DNP [facility] [address] [place], [place] [address] [phone]

## 2022-12-25 ENCOUNTER — Telehealth: Payer: Self-pay | Admitting: Nurse Practitioner

## 2022-12-25 NOTE — Telephone Encounter (Signed)
Pt called in, very upset, stating that she had a visit with PCP yesterday and says only 1 inhaler was sent in for her and it was supposed to be 5 refills total. Also says no Rx for Diflucan or Lidocaine patches were sent in for her and was supposed to be.   Please advise.

## 2022-12-28 ENCOUNTER — Other Ambulatory Visit: Payer: Self-pay | Admitting: Nurse Practitioner

## 2022-12-28 MED ORDER — ALBUTEROL SULFATE HFA 108 (90 BASE) MCG/ACT IN AERS: 2.0000 | INHALATION_SPRAY | RESPIRATORY_TRACT | 2 refills | Status: DC | PRN

## 2022-12-28 MED ORDER — FLUCONAZOLE 150 MG PO TABS: 150.0000 mg | ORAL_TABLET | Freq: Every day | ORAL | 1 refills | Status: DC

## 2022-12-28 NOTE — Addendum Note (Signed)
Addended by: Evern Bio, Dois Davenport on: 12/28/2022 11:30 AM   Modules accepted: Orders

## 2022-12-28 NOTE — Telephone Encounter (Signed)
Pt aware and verbalized understanding. Refill sent in for albuterol inhaler okay per sandra

## 2022-12-28 NOTE — Telephone Encounter (Signed)
NA NVM - lidocaine patches were not sent in because she is being referred to pain management, pt should hear from referral team within a week

## 2023-01-19 DIAGNOSIS — F332 Major depressive disorder, recurrent severe without psychotic features: Secondary | ICD-10-CM | POA: Diagnosis not present

## 2023-03-06 DIAGNOSIS — Z79899 Other long term (current) drug therapy: Secondary | ICD-10-CM | POA: Diagnosis not present

## 2023-03-06 DIAGNOSIS — M4727 Other spondylosis with radiculopathy, lumbosacral region: Secondary | ICD-10-CM | POA: Diagnosis not present

## 2023-03-06 DIAGNOSIS — M542 Cervicalgia: Secondary | ICD-10-CM | POA: Diagnosis not present

## 2023-03-06 DIAGNOSIS — Z6827 Body mass index (BMI) 27.0-27.9, adult: Secondary | ICD-10-CM | POA: Diagnosis not present

## 2023-03-10 DIAGNOSIS — Z79899 Other long term (current) drug therapy: Secondary | ICD-10-CM | POA: Diagnosis not present

## 2023-03-19 DIAGNOSIS — M4727 Other spondylosis with radiculopathy, lumbosacral region: Secondary | ICD-10-CM | POA: Diagnosis not present

## 2023-03-25 ENCOUNTER — Other Ambulatory Visit: Payer: Self-pay | Admitting: Nurse Practitioner

## 2023-03-25 DIAGNOSIS — Z7985 Long-term (current) use of injectable non-insulin antidiabetic drugs: Secondary | ICD-10-CM

## 2023-03-25 DIAGNOSIS — I272 Pulmonary hypertension, unspecified: Secondary | ICD-10-CM

## 2023-03-25 DIAGNOSIS — E1151 Type 2 diabetes mellitus with diabetic peripheral angiopathy without gangrene: Secondary | ICD-10-CM

## 2023-03-25 DIAGNOSIS — Z7984 Long term (current) use of oral hypoglycemic drugs: Secondary | ICD-10-CM

## 2023-03-25 DIAGNOSIS — E782 Mixed hyperlipidemia: Secondary | ICD-10-CM

## 2023-03-25 NOTE — Patient Instructions (Signed)
 Our records indicate that you are due for your annual mammogram/breast imaging. While there is no way to prevent breast cancer, early detection provides the best opportunity for curing it. For women over the age of 39, the American Cancer Society recommends a yearly clinical breast exam and a yearly mammogram. These practices have saved thousands of lives. We need your help to ensure that you are receiving optimal medical care. Please call the imaging location that has done you previous mammograms. Please remember to list [place] as your primary care. This helps make sure we receive a report and can update your chart.  Below is the contact information for several local breast imaging centers. You may call the location that works best for you, and they will be happy to assistance in making you an appointment. You do not need an order for a regular screening mammogram. However, if you are having any problems or concerns with you breast area, please let your primary care provider know, and appropriate orders will be placed. Please let our office know if you have any questions or concerns. Or if you need information for another imaging center not on this list or outside of the area. We are commented to working with you on your health care journey.   The mobile unit/bus ([facility]) - they come twice a month to our location.  These appointments can be made through our office or by call The Breast Center  [facility]  [address] [place], [place] [address] Phone [phone]  [facility]  [address] [place], [place] [address] [phone]  [facility] ([facility] of [facility])  [phone] [address], [place] [address] [phone]  [facility]  [address] [place] [place] [address] [phone]  [facility] - [facility]  [address] [place] [address] [phone]  [facility]ammography in [place]  [address] [place], [place] [address] [phone]  [facility] [address] [place], [place] [address] [phone]  [facility] [address] [place], [place] [address] [phone]  [facility] [place], [place] [address] [phone]

## 2023-03-29 ENCOUNTER — Ambulatory Visit: Payer: Medicaid Other | Admitting: Nurse Practitioner

## 2023-03-29 ENCOUNTER — Other Ambulatory Visit: Payer: Self-pay | Admitting: Nurse Practitioner

## 2023-03-29 DIAGNOSIS — Z0000 Encounter for general adult medical examination without abnormal findings: Secondary | ICD-10-CM

## 2023-03-29 DIAGNOSIS — K219 Gastro-esophageal reflux disease without esophagitis: Secondary | ICD-10-CM

## 2023-03-30 ENCOUNTER — Other Ambulatory Visit (HOSPITAL_COMMUNITY): Payer: Self-pay

## 2023-03-30 ENCOUNTER — Ambulatory Visit: Payer: Medicaid Other | Admitting: Family Medicine

## 2023-03-30 ENCOUNTER — Encounter: Payer: Self-pay | Admitting: Family Medicine

## 2023-03-30 VITALS — BP 113/71 | HR 93 | Temp 96.5°F | Ht 63.0 in | Wt 151.0 lb

## 2023-03-30 DIAGNOSIS — E1140 Type 2 diabetes mellitus with diabetic neuropathy, unspecified: Secondary | ICD-10-CM

## 2023-03-30 DIAGNOSIS — F331 Major depressive disorder, recurrent, moderate: Secondary | ICD-10-CM | POA: Diagnosis not present

## 2023-03-30 DIAGNOSIS — E1159 Type 2 diabetes mellitus with other circulatory complications: Secondary | ICD-10-CM | POA: Diagnosis not present

## 2023-03-30 DIAGNOSIS — I152 Hypertension secondary to endocrine disorders: Secondary | ICD-10-CM | POA: Diagnosis not present

## 2023-03-30 DIAGNOSIS — E782 Mixed hyperlipidemia: Secondary | ICD-10-CM | POA: Diagnosis not present

## 2023-03-30 DIAGNOSIS — I272 Pulmonary hypertension, unspecified: Secondary | ICD-10-CM | POA: Diagnosis not present

## 2023-03-30 LAB — BAYER DCA HB A1C WAIVED: HB A1C (BAYER DCA - WAIVED): 12.9 % — ABNORMAL HIGH (ref 4.8–5.6)

## 2023-03-30 MED ORDER — FREESTYLE LIBRE 3 PLUS SENSOR MISC
3 refills | Status: AC
Start: 2023-03-30 — End: ?

## 2023-03-30 MED ORDER — FLUCONAZOLE 150 MG PO TABS: 150.0000 mg | ORAL_TABLET | Freq: Every day | ORAL | 0 refills | Status: AC

## 2023-03-30 MED ORDER — TRULICITY 4.5 MG/0.5ML ~~LOC~~ SOAJ: 4.5000 mg | SUBCUTANEOUS | 3 refills | Status: AC

## 2023-03-30 NOTE — Progress Notes (Deleted)
 Chief Complaint  Patient presents with   Medical Management of Chronic Issues   Vaginitis    Pt reports symptoms started a week ago. Itching and burning.    HPI  Patient presents today for ***  PMH: Smoking status noted ROS: Per HPI  Objective: BP 113/71   Pulse 93   Temp (!) 96.5 F (35.8 C)   Ht 5\' 3"  (1.6 m)   Wt 151 lb (68.5 kg)   LMP 09/03/2011   SpO2 98%   BMI 26.75 kg/m  Gen: NAD, alert, cooperative with exam HEENT: NCAT, EOMI, PERRL CV: RRR, good S1/S2, no murmur Resp: CTABL, no wheezes, non-labored Abd: SNTND, BS present, no guarding or organomegaly Ext: No edema, warm Neuro: Alert and oriented, No gross deficits  Assessment and plan:  1. Type 2 diabetes mellitus with diabetic neuropathy, without long-term current use of insulin (HCC)   2. Mixed hyperlipidemia   3. Pulmonary HTN (HCC)   4. Hypertension associated with diabetes (HCC)     No orders of the defined types were placed in this encounter.   Orders Placed This Encounter  Procedures   Bayer DCA Hb A1c Waived   CBC with Differential/Platelet   CMP14+EGFR   Lipid panel    Follow up as needed.  Mechele Claude, MD

## 2023-03-30 NOTE — Progress Notes (Signed)
 Subjective:  Patient ID: Penny Moreno,  female    DOB: 1965/02/14  Age: 58 y.o.    CC: Medical Management of Chronic Issues and Vaginitis (Pt reports symptoms started a week ago. Itching and burning.)   HPI Penny Moreno presents for  follow-up of hypertension. Patient has no history of headache chest pain or shortness of breath or recent cough. Patient also denies symptoms of TIA such as numbness weakness lateralizing. Patient denies side effects from medication. States taking it regularly.  Patient also  in for follow-up of elevated cholesterol. Doing well without complaints on current medication. Denies side effects  including myalgia and arthralgia and nausea. Also in today for liver function testing. Currently no chest pain, shortness of breath or other cardiovascular related symptoms noted.  Follow-up of diabetes. Patient does check blood sugar at home. Patient denies symptoms such as excessive hunger or urinary frequency, excessive hunger, nausea. States she follows a low carb diet. No significant hypoglycemic spells noted. Medications reviewed. Pt reports taking them regularly. Pt. denies complication/adverse reaction today.    History Penny Moreno has a past medical history of Anxiety, Arthritis, Back pain, Bipolar disorder (HCC), Depression, Eating disorder, Frozen shoulder, GERD (gastroesophageal reflux disease), Leukocytosis (07/02/2015), Neuromuscular disorder (HCC), OCD (obsessive compulsive disorder), Panic disorder, Vaginal Pap smear, abnormal, and Vocal cord polyps.   She has a past surgical history that includes Diagnostic laparoscopy; Tonsillectomy; Laparoscopic tubal ligation (10/30/2011); Tubal ligation (10/04/2011); and lt shoulder surgery.   Her family history includes Alcohol abuse in her father; Bipolar disorder in her sister; Cancer in her maternal grandmother; Heart disease (age of onset: 7) in her maternal grandfather; Hyperlipidemia in her father and mother;  Hypertension in her mother.She reports that she has been smoking cigarettes. She has a 15 pack-year smoking history. She has never used smokeless tobacco. She reports that she does not drink alcohol and does not use drugs.  Current Outpatient Medications on File Prior to Visit  Medication Sig Dispense Refill   albuterol (VENTOLIN HFA) 108 (90 Base) MCG/ACT inhaler Inhale 2 puffs into the lungs every 4 (four) hours as needed for wheezing or shortness of breath. 6.7 g 2   ALPRAZolam (XANAX) 0.5 MG tablet Take 1 tablet (0.5 mg total) by mouth 3 (three) times daily as needed. For anxiety 90 tablet 0   atenolol (TENORMIN) 25 MG tablet TAKE ONE TABLET AT BEDTIME 90 tablet 0   atorvastatin (LIPITOR) 80 MG tablet TAKE ONE TABLET DAILY 90 tablet 0   cetirizine (ZYRTEC) 10 MG tablet TAKE ONE TABLET DAILY 90 tablet 1   cyclobenzaprine (FLEXERIL) 10 MG tablet Take 1 tablet (10 mg total) by mouth 3 (three) times daily. 90 tablet 5   ezetimibe (ZETIA) 10 MG tablet TAKE ONE TABLET DAILY 90 tablet 0   fluticasone (FLONASE) 50 MCG/ACT nasal spray USE 1 SPRAY IN EACH NOSTRIL 2 TIMES A DAY AS NEEDED FOR ALLERGY OR RHINITIS (Needs to be seen before next refill) 16 mL 0   gabapentin (NEURONTIN) 800 MG tablet Take 800 mg by mouth 3 (three) times daily.     gemfibrozil (LOPID) 600 MG tablet Take 1 tablet (600 mg total) by mouth 2 (two) times daily before a meal. 180 tablet 0   glipiZIDE (GLUCOTROL XL) 5 MG 24 hr tablet TAKE ONE TABLET DAILY WITH BREAKFAST 90 tablet 0   KERENDIA 10 MG TABS TAKE ONE TABLET DAILY 90 tablet 0   lidocaine (LIDODERM) 5 % APPLY 1 PATCH BY TOPICAL ROUTE ONCE  DAILY (MAY WEAR UP TO 12HOURS.)     lurasidone (LATUDA) 40 MG TABS tablet TAKE 1 TABLET WITH BREAKFAST 90 tablet 0   omega-3 acid ethyl esters (LOVAZA) 1 g capsule TAKE ONE CAPSULE DAILY 90 capsule 0   Omega-3 Fatty Acids (FISH OIL) 1200 MG CPDR Take 1 tablet by mouth daily. 90 capsule 0   pantoprazole (PROTONIX) 40 MG tablet TAKE ONE  TABLET DAILY 90 tablet 0   sertraline (ZOLOFT) 50 MG tablet Take 1 tablet (50 mg total) by mouth daily. 90 tablet 0   traMADol (ULTRAM) 50 MG tablet Take 1 tablet by mouth 3 (three) times daily as needed.     No current facility-administered medications on file prior to visit.    ROS Review of Systems  Constitutional: Negative.   HENT:  Negative for congestion.   Eyes:  Negative for visual disturbance.  Respiratory:  Negative for shortness of breath.        Idiopathic pulmonary HTN   Cardiovascular:  Negative for chest pain.  Gastrointestinal:  Negative for abdominal pain, constipation, diarrhea, nausea and vomiting.  Genitourinary:  Positive for vaginal pain (itching, odor - has chronic recurring yeast vaginitis.). Negative for difficulty urinating.  Musculoskeletal:  Negative for arthralgias and myalgias.  Neurological:  Negative for headaches.  Psychiatric/Behavioral:  Negative for sleep disturbance.     Objective:  BP 113/71   Pulse 93   Temp (!) 96.5 F (35.8 C)   Ht 5\' 3"  (1.6 m)   Wt 151 lb (68.5 kg)   LMP 09/03/2011   SpO2 98%   BMI 26.75 kg/m   BP Readings from Last 3 Encounters:  03/30/23 113/71  12/24/22 118/66  09/22/22 117/84    Wt Readings from Last 3 Encounters:  03/30/23 151 lb (68.5 kg)  12/24/22 154 lb 9.6 oz (70.1 kg)  09/22/22 157 lb 12.8 oz (71.6 kg)     Physical Exam Constitutional:      General: She is not in acute distress.    Appearance: She is well-developed.  HENT:     Head: Normocephalic and atraumatic.  Eyes:     Conjunctiva/sclera: Conjunctivae normal.     Pupils: Pupils are equal, round, and reactive to light.  Neck:     Thyroid: No thyromegaly.  Cardiovascular:     Rate and Rhythm: Normal rate and regular rhythm.     Heart sounds: Normal heart sounds. No murmur heard. Pulmonary:     Effort: Pulmonary effort is normal. No respiratory distress.     Breath sounds: Normal breath sounds. No wheezing or rales.  Abdominal:      General: Bowel sounds are normal. There is no distension.     Palpations: Abdomen is soft.     Tenderness: There is no abdominal tenderness.  Musculoskeletal:        General: Normal range of motion.     Cervical back: Normal range of motion and neck supple.  Lymphadenopathy:     Cervical: No cervical adenopathy.  Skin:    General: Skin is warm and dry.  Neurological:     Mental Status: She is alert and oriented to person, place, and time.  Psychiatric:        Behavior: Behavior normal.        Thought Content: Thought content normal.        Judgment: Judgment normal.     A1c today =12.5  Lab Results  Component Value Date   HGBA1C 12.8 (H) 12/24/2022   HGBA1C 11.7 (  H) 09/22/2022   HGBA1C 9.1 (H) 01/16/2019    Assessment & Plan:   Maecy was seen today for medical management of chronic issues and vaginitis.  Diagnoses and all orders for this visit:  Type 2 diabetes mellitus with diabetic neuropathy, without long-term current use of insulin (HCC) -     Bayer DCA Hb A1c Waived  Mixed hyperlipidemia -     Lipid panel  Pulmonary HTN (HCC) -     CBC with Differential/Platelet -     CMP14+EGFR  Hypertension associated with diabetes (HCC) -     CBC with Differential/Platelet -     CMP14+EGFR  Moderate episode of recurrent major depressive disorder (HCC)  Other orders -     Continuous Glucose Sensor (FREESTYLE LIBRE 3 PLUS SENSOR) MISC; Change sensor every 15 days. -     fluconazole (DIFLUCAN) 150 MG tablet; Take 1 tablet (150 mg total) by mouth daily for 5 doses. At onset of symptoms. Repeat at end of treatment -     Dulaglutide (TRULICITY) 4.5 MG/0.5ML SOAJ; Inject 4.5 mg as directed once a week.   I have discontinued Victorino Dike C. Zwahlen's FreeStyle [employer], [employer] 3 Reader, and [employer]. I am also having her start on FreeStyle Libre 3 Plus Sensor and Trulicity. Additionally, I am having her maintain her ALPRAZolam, fluticasone, lidocaine, traMADol,  sertraline, lurasidone, gemfibrozil, Fish Oil, cyclobenzaprine, albuterol, Kerendia, omega-3 acid ethyl esters, atenolol, ezetimibe, atorvastatin, glipiZIDE, pantoprazole, cetirizine, gabapentin, and fluconazole.  Meds ordered this encounter  Medications   Continuous Glucose Sensor (FREESTYLE LIBRE 3 PLUS SENSOR) MISC    Sig: Change sensor every 15 days.    Dispense:  6 each    Refill:  3   fluconazole (DIFLUCAN) 150 MG tablet    Sig: Take 1 tablet (150 mg total) by mouth daily for 5 doses. At onset of symptoms. Repeat at end of treatment    Dispense:  5 tablet    Refill:  0   Dulaglutide (TRULICITY) 4.5 MG/0.5ML SOAJ    Sig: Inject 4.5 mg as directed once a week.    Dispense:  6 mL    Refill:  3     Follow-up: Return in about 3 months (around 06/27/2023).  Mechele Claude, M.D.

## 2023-03-31 LAB — CMP14+EGFR
ALT: 12 [IU]/L (ref 0–32)
AST: 11 [IU]/L (ref 0–40)
Albumin: 4.5 g/dL (ref 3.8–4.9)
Alkaline Phosphatase: 133 [IU]/L — ABNORMAL HIGH (ref 44–121)
BUN/Creatinine Ratio: 6 — ABNORMAL LOW (ref 9–23)
BUN: 4 mg/dL — ABNORMAL LOW (ref 6–24)
Bilirubin Total: 0.2 mg/dL (ref 0.0–1.2)
CO2: 22 mmol/L (ref 20–29)
Calcium: 9.5 mg/dL (ref 8.7–10.2)
Chloride: 96 mmol/L (ref 96–106)
Creatinine, Ser: 0.7 mg/dL (ref 0.57–1.00)
Globulin, Total: 2.3 g/dL (ref 1.5–4.5)
Glucose: 362 mg/dL — ABNORMAL HIGH (ref 70–99)
Potassium: 4.2 mmol/L (ref 3.5–5.2)
Sodium: 136 mmol/L (ref 134–144)
Total Protein: 6.8 g/dL (ref 6.0–8.5)
eGFR: 102 mL/min/{1.73_m2} (ref >59)

## 2023-03-31 LAB — CBC WITH DIFFERENTIAL/PLATELET
Basophils Absolute: 0.1 10*3/uL (ref 0.0–0.2)
Basos: 1 %
EOS (ABSOLUTE): 0.1 10*3/uL (ref 0.0–0.4)
Eos: 1 %
Hematocrit: 46.7 % — ABNORMAL HIGH (ref 34.0–46.6)
Hemoglobin: 15.5 g/dL (ref 11.1–15.9)
Immature Grans (Abs): 0 10*3/uL (ref 0.0–0.1)
Immature Granulocytes: 0 %
Lymphocytes Absolute: 2.6 10*3/uL (ref 0.7–3.1)
Lymphs: 25 %
MCH: 31.3 pg (ref 26.6–33.0)
MCHC: 33.2 g/dL (ref 31.5–35.7)
MCV: 94 fL (ref 79–97)
Monocytes Absolute: 0.4 10*3/uL (ref 0.1–0.9)
Monocytes: 4 %
Neutrophils Absolute: 7.3 10*3/uL — ABNORMAL HIGH (ref 1.4–7.0)
Neutrophils: 69 %
Platelets: 172 10*3/uL (ref 150–450)
RBC: 4.95 x10E6/uL (ref 3.77–5.28)
RDW: 11.7 % (ref 11.7–15.4)
WBC: 10.6 10*3/uL (ref 3.4–10.8)

## 2023-03-31 LAB — LIPID PANEL
Chol/HDL Ratio: 4.5 {ratio} — ABNORMAL HIGH (ref 0.0–4.4)
Cholesterol, Total: 152 mg/dL (ref 100–199)
HDL: 34 mg/dL — ABNORMAL LOW (ref >39)
LDL Chol Calc (NIH): 65 mg/dL (ref 0–99)
Triglycerides: 340 mg/dL — ABNORMAL HIGH (ref 0–149)
VLDL Cholesterol Cal: 53 mg/dL — ABNORMAL HIGH (ref 5–40)

## 2023-04-01 ENCOUNTER — Other Ambulatory Visit: Payer: Self-pay | Admitting: Family Medicine

## 2023-04-01 DIAGNOSIS — E782 Mixed hyperlipidemia: Secondary | ICD-10-CM

## 2023-04-12 ENCOUNTER — Telehealth: Payer: Self-pay

## 2023-04-12 ENCOUNTER — Other Ambulatory Visit (HOSPITAL_COMMUNITY): Payer: Self-pay

## 2023-04-12 NOTE — Telephone Encounter (Signed)
 Pharmacy Patient Advocate Encounter   Received notification from CoverMyMeds that prior authorization for FreeStyle Libre 3 Plus Sensor is required/requested.   Insurance verification completed.   The patient is insured through [facility] .   Per test claim: PA required; PA submitted to above mentioned insurance via CoverMyMeds Key/confirmation #/EOC [id number] Status is pending

## 2023-04-14 NOTE — Telephone Encounter (Signed)
 Pharmacy Patient Advocate Encounter  Received notification from [facility] that Prior Authorization for FreeStyle Libre 3 Plus Sensor has been DENIED.  Full denial letter will be uploaded to the media tab. See denial reason below.   PA #/Case ID/Reference #: [id number]

## 2023-04-20 DIAGNOSIS — F332 Major depressive disorder, recurrent severe without psychotic features: Secondary | ICD-10-CM | POA: Diagnosis not present

## 2023-05-11 ENCOUNTER — Encounter: Payer: Self-pay | Admitting: Family Medicine

## 2023-05-11 ENCOUNTER — Telehealth: Payer: Self-pay

## 2023-05-11 ENCOUNTER — Ambulatory Visit: Admitting: Family Medicine

## 2023-05-11 VITALS — BP 118/82 | HR 85 | Temp 97.9°F | Ht 62.0 in | Wt 150.0 lb

## 2023-05-11 DIAGNOSIS — E1140 Type 2 diabetes mellitus with diabetic neuropathy, unspecified: Secondary | ICD-10-CM

## 2023-05-11 DIAGNOSIS — L720 Epidermal cyst: Secondary | ICD-10-CM

## 2023-05-11 MED ORDER — DICLOFENAC SODIUM 75 MG PO TBEC: 75.0000 mg | DELAYED_RELEASE_TABLET | Freq: Two times a day (BID) | ORAL | 2 refills | Status: DC

## 2023-05-11 NOTE — Telephone Encounter (Signed)
 Pharmacy Patient Advocate Encounter   Received notification from CoverMyMeds that prior authorization for  Diclofenac Sodium 75MG  dr tablets is required/requested.   Insurance verification completed.   The patient is insured through [facility] .   Per test claim: PA required; PA submitted to above mentioned insurance via CoverMyMeds Key/confirmation #/EOC [id number] Status is pending

## 2023-05-11 NOTE — Progress Notes (Signed)
 Subjective:  Patient ID: Penny Moreno, female    DOB: 30-Nov-1965  Age: 58 y.o. MRN: [id number]  CC: Cyst (Left knee cyst. Started years ago. Painful and swollen and red. Seems to have shrunk since making appointment. Would like referral. ) and Medication Refill (Would like a refill of Trulicity but would like to go back down to 3 mg. )   HPI Penny Moreno presents for concern about the cyst on her left knee.  She wants to have it removed.  Additionally she is concerned about her diabetes.  She has been on 3 mg of Trulicity doing fine on that but was told to increase it to 4.5.  She is not sure why.     12/24/2022   12:06 PM 09/22/2022    9:34 AM 01/16/2019    2:29 PM  Depression screen PHQ 2/9  Decreased Interest 3 3 0  Down, Depressed, Hopeless 1 2 0  PHQ - 2 Score 4 5 0  Altered sleeping 2 2   Tired, decreased energy 2 3   Change in appetite 1 2   Feeling bad or failure about yourself  0 2   Trouble concentrating 3 3   Moving slowly or fidgety/restless 2 1   Suicidal thoughts 1 2   PHQ-9 Score 15 20   Difficult doing work/chores Not difficult at all Extremely dIfficult     History Penny Moreno has a past medical history of Anxiety, Arthritis, Back pain, Bipolar disorder (HCC), Depression, Eating disorder, Frozen shoulder, GERD (gastroesophageal reflux disease), Leukocytosis (07/02/2015), Neuromuscular disorder (HCC), OCD (obsessive compulsive disorder), Panic disorder, Vaginal Pap smear, abnormal, and Vocal cord polyps.   She has a past surgical history that includes Diagnostic laparoscopy; Tonsillectomy; Laparoscopic tubal ligation (10/30/2011); Tubal ligation (10/04/2011); and lt shoulder surgery.   Her family history includes Alcohol abuse in her father; Bipolar disorder in her sister; Cancer in her maternal grandmother; Heart disease (age of onset: 73) in her maternal grandfather; Hyperlipidemia in her father and mother; Hypertension in her mother.She reports that she has  been smoking cigarettes. She has a 15 pack-year smoking history. She has never used smokeless tobacco. She reports that she does not drink alcohol and does not use drugs.    ROS Review of Systems  Constitutional: Negative.   HENT: Negative.    Eyes:  Negative for visual disturbance.  Respiratory:  Negative for shortness of breath.   Cardiovascular:  Negative for chest pain.  Gastrointestinal:  Negative for abdominal pain.  Musculoskeletal:  Negative for arthralgias.    Objective:  BP 118/82   Pulse 85   Temp 97.9 F (36.6 C)   Ht 5\' 2"  (1.575 m)   Wt 150 lb (68 kg)   LMP 09/03/2011   SpO2 96%   BMI 27.44 kg/m   BP Readings from Last 3 Encounters:  05/11/23 118/82  03/30/23 113/71  12/24/22 118/66    Wt Readings from Last 3 Encounters:  05/11/23 150 lb (68 kg)  03/30/23 151 lb (68.5 kg)  12/24/22 154 lb 9.6 oz (70.1 kg)     Physical Exam Constitutional:      General: She is not in acute distress.    Appearance: She is well-developed.  Cardiovascular:     Rate and Rhythm: Normal rate and regular rhythm.  Pulmonary:     Breath sounds: Normal breath sounds.  Musculoskeletal:        General: Normal range of motion.  Skin:    General: Skin is warm  and dry.     Findings: Lesion present.  Neurological:     Mental Status: She is alert and oriented to person, place, and time.   2 cm epidermal inclusion cyst just below the kneecap just slightly lateral to anterior located at the left knee.   Assessment & Plan:  Epidermal inclusion cyst -     Ambulatory referral to Dermatology  Type 2 diabetes mellitus with diabetic neuropathy, without long-term current use of insulin (HCC)  Other orders -     Diclofenac Sodium; Take 1 tablet (75 mg total) by mouth 2 (two) times daily. For TMJ and arthritis pain  Dispense: 60 tablet; Refill: 2   Patient states she does not understand diabetes.  She is not sure about carbohydrates.  And she certainly does not understand the  fact that her A1c of 12.5 is dangerous.  I had this conversation with her at the time of her previous visit, however she seems to have not internalized the discussion.  We reviewed the dangers of an elevated A1c specifically related to endorgan disease such as heart attack stroke renal insufficiency and peripheral vascular disease.  We then had a discussion about carbohydrates including starches and complex and simple carbohydrates.  She expressed better understanding at the end of the conversation.  36 minutes was spent with the patient more than half of which was in discussion of the above topics relating to her diabetes.  She was also referred to Dr. Hematology for removal of her cyst.  Follow-up: No follow-ups on file.  Mechele Claude, M.D.

## 2023-05-12 ENCOUNTER — Other Ambulatory Visit (HOSPITAL_COMMUNITY): Payer: Self-pay

## 2023-05-12 NOTE — Telephone Encounter (Signed)
 Pharmacy Patient Advocate Encounter  Received notification from [facility] that Prior Authorization for Diclofenac Sodium 75MG  dr tablets has been APPROVED from 05/11/23 to 05/10/24. Unable to obtain price due to refill too soon rejection, last fill date 05/12/23 next available fill date05/01/25   PA #/Case ID/Reference #: [id number]

## 2023-05-28 ENCOUNTER — Encounter: Payer: Self-pay | Admitting: Family Medicine

## 2023-06-23 ENCOUNTER — Other Ambulatory Visit: Payer: Self-pay | Admitting: Nurse Practitioner

## 2023-06-23 DIAGNOSIS — Z7985 Long-term (current) use of injectable non-insulin antidiabetic drugs: Secondary | ICD-10-CM

## 2023-06-23 DIAGNOSIS — I272 Pulmonary hypertension, unspecified: Secondary | ICD-10-CM

## 2023-06-23 DIAGNOSIS — E782 Mixed hyperlipidemia: Secondary | ICD-10-CM

## 2023-06-23 DIAGNOSIS — K219 Gastro-esophageal reflux disease without esophagitis: Secondary | ICD-10-CM

## 2023-06-23 DIAGNOSIS — E1151 Type 2 diabetes mellitus with diabetic peripheral angiopathy without gangrene: Secondary | ICD-10-CM

## 2023-06-25 ENCOUNTER — Other Ambulatory Visit: Payer: Self-pay | Admitting: Nurse Practitioner

## 2023-06-25 DIAGNOSIS — Z7985 Long-term (current) use of injectable non-insulin antidiabetic drugs: Secondary | ICD-10-CM

## 2023-06-25 DIAGNOSIS — E1151 Type 2 diabetes mellitus with diabetic peripheral angiopathy without gangrene: Secondary | ICD-10-CM

## 2023-06-25 DIAGNOSIS — Z7984 Long term (current) use of oral hypoglycemic drugs: Secondary | ICD-10-CM

## 2023-06-29 ENCOUNTER — Ambulatory Visit: Payer: Medicaid Other | Admitting: Family Medicine

## 2023-06-29 ENCOUNTER — Ambulatory Visit: Payer: Self-pay | Admitting: Family Medicine

## 2023-06-29 ENCOUNTER — Other Ambulatory Visit: Payer: Self-pay | Admitting: Nurse Practitioner

## 2023-06-29 ENCOUNTER — Other Ambulatory Visit: Payer: Self-pay | Admitting: Family Medicine

## 2023-06-29 ENCOUNTER — Encounter: Payer: Self-pay | Admitting: Family Medicine

## 2023-06-29 VITALS — BP 123/79 | HR 104 | Temp 97.3°F | Ht 62.0 in | Wt 149.0 lb

## 2023-06-29 DIAGNOSIS — Z7985 Long-term (current) use of injectable non-insulin antidiabetic drugs: Secondary | ICD-10-CM

## 2023-06-29 DIAGNOSIS — I152 Hypertension secondary to endocrine disorders: Secondary | ICD-10-CM | POA: Diagnosis not present

## 2023-06-29 DIAGNOSIS — Z7984 Long term (current) use of oral hypoglycemic drugs: Secondary | ICD-10-CM | POA: Diagnosis not present

## 2023-06-29 DIAGNOSIS — F17210 Nicotine dependence, cigarettes, uncomplicated: Secondary | ICD-10-CM

## 2023-06-29 DIAGNOSIS — R609 Edema, unspecified: Secondary | ICD-10-CM | POA: Diagnosis not present

## 2023-06-29 DIAGNOSIS — E1140 Type 2 diabetes mellitus with diabetic neuropathy, unspecified: Secondary | ICD-10-CM

## 2023-06-29 DIAGNOSIS — E1151 Type 2 diabetes mellitus with diabetic peripheral angiopathy without gangrene: Secondary | ICD-10-CM

## 2023-06-29 DIAGNOSIS — E1159 Type 2 diabetes mellitus with other circulatory complications: Secondary | ICD-10-CM

## 2023-06-29 DIAGNOSIS — E782 Mixed hyperlipidemia: Secondary | ICD-10-CM

## 2023-06-29 LAB — BAYER DCA HB A1C WAIVED: HB A1C (BAYER DCA - WAIVED): 9.9 % — ABNORMAL HIGH (ref 4.8–5.6)

## 2023-06-29 LAB — LIPID PANEL

## 2023-06-29 MED ORDER — BLOOD GLUCOSE TEST VI STRP
1.0000 | ORAL_STRIP | Freq: Three times a day (TID) | 99 refills | Status: AC
Start: 2023-06-29 — End: 2023-07-29

## 2023-06-29 MED ORDER — BLOOD GLUCOSE MONITORING SUPPL DEVI: 1.0000 | Freq: Three times a day (TID) | 0 refills | Status: AC

## 2023-06-29 MED ORDER — KERENDIA 10 MG PO TABS
1.0000 | ORAL_TABLET | Freq: Every day | ORAL | 0 refills | Status: DC
Start: 2023-06-29 — End: 2023-12-24

## 2023-06-29 MED ORDER — LANCET DEVICE MISC: 1.0000 | Freq: Three times a day (TID) | 0 refills | Status: AC

## 2023-06-29 MED ORDER — LANCETS MISC. MISC
1.0000 | Freq: Three times a day (TID) | 0 refills | Status: AC
Start: 2023-06-29 — End: 2023-07-29

## 2023-06-29 NOTE — Progress Notes (Signed)
 Subjective:  Patient ID: Penny Moreno,  female    DOB: February 28, 1965  Age: 58 y.o.    CC: Medical Management of Chronic Issues   HPI Penny Moreno presents for  follow-up of hypertension. Patient has no history of headache chest pain or shortness of breath or recent cough. Patient also denies symptoms of TIA such as numbness weakness lateralizing. Patient denies side effects from medication. States taking it regularly.  Patient also  in for follow-up of elevated cholesterol. Doing well without complaints on current medication. Denies side effects  including myalgia and arthralgia and nausea. Also in today for liver function testing. Currently no chest pain, shortness of breath or other cardiovascular related symptoms noted.  Follow-up of diabetes. Patient doesnot  check blood sugar at home.  Her strips are out of date.  Patient denies symptoms such as excessive hunger or urinary frequency, excessive hunger, nausea No significant hypoglycemic spells noted.  Occasionally she will get a bit of a headache and will suck on a single section of the Hershey bar and that will make the headache go away.  Since she is not checking her sugar this may or may not represent a mild hypoglycemia.  However, there are no other symptoms noted with the spells.  They are infrequent.  Patient is also continue to use Trulicity  and glipizide  to control her blood sugar Medications reviewed. Pt reports taking them regularly. Pt. denies complication/adverse reaction today.    History Maven has a past medical history of Anxiety, Arthritis, Back pain, Bipolar disorder (HCC), Depression, Eating disorder, Frozen shoulder, GERD (gastroesophageal reflux disease), Leukocytosis (07/02/2015), Neuromuscular disorder (HCC), OCD (obsessive compulsive disorder), Panic disorder, Vaginal Pap smear, abnormal, and Vocal cord polyps.   She has a past surgical history that includes Diagnostic laparoscopy; Tonsillectomy;  Laparoscopic tubal ligation (10/30/2011); Tubal ligation (10/04/2011); and lt shoulder surgery.   Her family history includes Alcohol abuse in her father; Bipolar disorder in her sister; Cancer in her maternal grandmother; Heart disease (age of onset: 56) in her maternal grandfather; Hyperlipidemia in her father and mother; Hypertension in her mother.She reports that she has been smoking cigarettes. She has a 15 pack-year smoking history. She has never used smokeless tobacco. She reports that she does not drink alcohol and does not use drugs.  Current Outpatient Medications on File Prior to Visit  Medication Sig Dispense Refill   albuterol  (VENTOLIN  HFA) 108 (90 Base) MCG/ACT inhaler Inhale 2 puffs into the lungs every 4 (four) hours as needed for wheezing or shortness of breath. 6.7 g 2   ALPRAZolam  (XANAX ) 0.5 MG tablet Take 1 tablet (0.5 mg total) by mouth 3 (three) times daily as needed. For anxiety 90 tablet 0   atenolol  (TENORMIN ) 25 MG tablet TAKE ONE TABLET AT BEDTIME 90 tablet 0   atorvastatin  (LIPITOR) 80 MG tablet TAKE ONE TABLET DAILY 90 tablet 0   cetirizine  (ZYRTEC ) 10 MG tablet TAKE ONE TABLET DAILY 90 tablet 1   Continuous Glucose Sensor (FREESTYLE LIBRE 3 PLUS SENSOR) MISC Change sensor every 15 days. 6 each 3   cyclobenzaprine  (FLEXERIL ) 10 MG tablet Take 1 tablet (10 mg total) by mouth 3 (three) times daily. 90 tablet 5   diclofenac  (VOLTAREN ) 75 MG EC tablet Take 1 tablet (75 mg total) by mouth 2 (two) times daily. For TMJ and arthritis pain 60 tablet 2   Dulaglutide  (TRULICITY ) 4.5 MG/0.5ML SOAJ Inject 4.5 mg as directed once a week. 6 mL 3   ezetimibe  (ZETIA ) 10  MG tablet TAKE ONE TABLET DAILY 90 tablet 0   fluticasone  (FLONASE ) 50 MCG/ACT nasal spray USE 1 SPRAY IN EACH NOSTRIL 2 TIMES A DAY AS NEEDED FOR ALLERGY OR RHINITIS (Needs to be seen before next refill) 16 mL 0   gabapentin  (NEURONTIN ) 800 MG tablet Take 800 mg by mouth 3 (three) times daily.     gemfibrozil  (LOPID ) 600  MG tablet TAKE ONE TABLET TWICE DAILY BEFORE MEALS 180 tablet 0   glipiZIDE  (GLUCOTROL  XL) 5 MG 24 hr tablet TAKE ONE TABLET DAILY WITH BREAKFAST 90 tablet 0   lidocaine  (LIDODERM ) 5 % APPLY 1 PATCH BY TOPICAL ROUTE ONCE DAILY (MAY WEAR UP TO 12HOURS.)     lurasidone  (LATUDA ) 40 MG TABS tablet TAKE 1 TABLET WITH BREAKFAST 90 tablet 0   MAGNESIUM PO Take by mouth.     Multiple Vitamin (MULTIVITAMIN PO) Take by mouth.     omega-3 acid ethyl esters (LOVAZA) 1 g capsule TAKE ONE CAPSULE DAILY 90 capsule 0   Omega-3 Fatty Acids (FISH OIL ) 1200 MG CPDR Take 1 tablet by mouth daily. 90 capsule 0   pantoprazole  (PROTONIX ) 40 MG tablet TAKE ONE TABLET DAILY 90 tablet 0   sertraline  (ZOLOFT ) 50 MG tablet Take 1 tablet (50 mg total) by mouth daily. 90 tablet 0   traMADol  (ULTRAM ) 50 MG tablet Take 1 tablet by mouth 3 (three) times daily as needed.     No current facility-administered medications on file prior to visit.    ROS Review of Systems  Constitutional: Negative.   HENT: Negative.    Eyes:  Negative for visual disturbance.  Respiratory:  Negative for shortness of breath.   Cardiovascular:  Positive for leg swelling (Just on the left.  She has got some compression socks she is using and she says she got 6 pair for $15.  She tells me that they are rather hard to get up her leg though.  They are not Ted hose). Negative for chest pain.  Gastrointestinal:  Negative for abdominal pain.  Musculoskeletal:  Negative for arthralgias.    Objective:  BP 123/79   Pulse (!) 104   Temp (!) 97.3 F (36.3 C) (Temporal)   Ht 5\' 2"  (1.575 m)   Wt 149 lb (67.6 kg)   LMP 09/03/2011   SpO2 92%   BMI 27.25 kg/m   BP Readings from Last 3 Encounters:  06/29/23 123/79  05/11/23 118/82  03/30/23 113/71    Wt Readings from Last 3 Encounters:  06/29/23 149 lb (67.6 kg)  05/11/23 150 lb (68 kg)  03/30/23 151 lb (68.5 kg)    Lab Results  Component Value Date   HGBA1C 12.9 (H) 03/30/2023   HGBA1C  12.8 (H) 12/24/2022   HGBA1C 11.7 (H) 09/22/2022    Physical Exam Constitutional:      General: She is not in acute distress.    Appearance: She is well-developed.  Cardiovascular:     Rate and Rhythm: Normal rate and regular rhythm.  Pulmonary:     Breath sounds: Normal breath sounds.  Musculoskeletal:        General: Normal range of motion.  Skin:    General: Skin is warm and dry.  Neurological:     Mental Status: She is alert and oriented to person, place, and time.         Assessment & Plan:  Type 2 diabetes mellitus with diabetic neuropathy, without long-term current use of insulin (HCC) -     Bayer  DCA Hb A1c Waived  Mixed hyperlipidemia -     Lipid panel  Hypertension associated with diabetes (HCC) -     CBC with Differential/Platelet -     CMP14+EGFR  Type II diabetes mellitus with peripheral circulatory disorder (HCC) -     Kerendia ; Take 1 tablet (10 mg total) by mouth daily.  Dispense: 90 tablet; Refill: 0  Long term current use of oral hypoglycemic drug -     Kerendia ; Take 1 tablet (10 mg total) by mouth daily.  Dispense: 90 tablet; Refill: 0  Long-term current use of injectable noninsulin antidiabetic medication -     Kerendia ; Take 1 tablet (10 mg total) by mouth daily.  Dispense: 90 tablet; Refill: 0  Smoking greater than 20 pack years -     Ambulatory Referral for Lung Cancer Scre  Dependent edema -     Compression stockings  Other orders -     Blood Glucose Monitoring Suppl; 1 each by Does not apply route in the morning, at noon, and at bedtime. May substitute to any manufacturer covered by patient's insurance.  Dispense: 1 each; Refill: 0 -     Blood Glucose Test; 1 each by In Vitro route in the morning, at noon, and at bedtime. May substitute to any manufacturer covered by patient's insurance.  Dispense: 100 strip; Refill: PRN -     Lancet Device; 1 each by Does not apply route in the morning, at noon, and at bedtime. May substitute to any  manufacturer covered by patient's insurance.  Dispense: 1 each; Refill: 0 -     Lancets Misc.; 1 each by Does not apply route in the morning, at noon, and at bedtime. May substitute to any manufacturer covered by patient's insurance.  Dispense: 100 each; Refill: 0    Follow-up: Return in about 3 months (around 09/29/2023) for diabetes.  Roise Cleaver, M.D.

## 2023-06-30 LAB — CMP14+EGFR
ALT: 24 IU/L (ref 0–32)
AST: 19 IU/L (ref 0–40)
Albumin: 4.6 g/dL (ref 3.8–4.9)
Alkaline Phosphatase: 117 IU/L (ref 44–121)
BUN/Creatinine Ratio: 12 (ref 9–23)
BUN: 10 mg/dL (ref 6–24)
CO2: 25 mmol/L (ref 20–29)
Calcium: 9.9 mg/dL (ref 8.7–10.2)
Chloride: 98 mmol/L (ref 96–106)
Creatinine, Ser: 0.82 mg/dL (ref 0.57–1.00)
Globulin, Total: 2.3 g/dL (ref 1.5–4.5)
Glucose: 249 mg/dL — ABNORMAL HIGH (ref 70–99)
Potassium: 4.5 mmol/L (ref 3.5–5.2)
Sodium: 140 mmol/L (ref 134–144)
Total Protein: 6.9 g/dL (ref 6.0–8.5)
eGFR: 84 mL/min/{1.73_m2} (ref >59)

## 2023-06-30 LAB — LIPID PANEL
Cholesterol, Total: 113 mg/dL (ref 100–199)
HDL: 31 mg/dL — ABNORMAL LOW (ref >39)
LDL CALC COMMENT:: 3.6 ratio (ref 0.0–4.4)
LDL Chol Calc (NIH): 48 mg/dL (ref 0–99)
Triglycerides: 207 mg/dL — ABNORMAL HIGH (ref 0–149)
VLDL Cholesterol Cal: 34 mg/dL (ref 5–40)

## 2023-06-30 LAB — CBC WITH DIFFERENTIAL/PLATELET
Basophils Absolute: 0.1 10*3/uL (ref 0.0–0.2)
Basos: 1 %
EOS (ABSOLUTE): 0.2 10*3/uL (ref 0.0–0.4)
Eos: 2 %
Hematocrit: 48 % — ABNORMAL HIGH (ref 34.0–46.6)
Hemoglobin: 16.1 g/dL — ABNORMAL HIGH (ref 11.1–15.9)
Immature Grans (Abs): 0 10*3/uL (ref 0.0–0.1)
Immature Granulocytes: 0 %
Lymphocytes Absolute: 3 10*3/uL (ref 0.7–3.1)
Lymphs: 26 %
MCH: 31.6 pg (ref 26.6–33.0)
MCHC: 33.5 g/dL (ref 31.5–35.7)
MCV: 94 fL (ref 79–97)
Monocytes Absolute: 0.5 10*3/uL (ref 0.1–0.9)
Monocytes: 4 %
Neutrophils Absolute: 7.9 10*3/uL — ABNORMAL HIGH (ref 1.4–7.0)
Neutrophils: 67 %
Platelets: 210 10*3/uL (ref 150–450)
RBC: 5.09 x10E6/uL (ref 3.77–5.28)
RDW: 12.7 % (ref 11.7–15.4)
WBC: 11.6 10*3/uL — ABNORMAL HIGH (ref 3.4–10.8)

## 2023-07-05 ENCOUNTER — Telehealth: Payer: Self-pay | Admitting: Family Medicine

## 2023-07-05 MED ORDER — FLUTICASONE PROPIONATE 50 MCG/ACT NA SUSP: NASAL | 0 refills | Status: DC

## 2023-07-05 NOTE — Telephone Encounter (Signed)
 Copied from CRM [id number]. Topic: Clinical - Medication Refill >> Jul 05, 2023  9:12 AM Bearl Botts E wrote: Medication: fluticasone  (FLONASE ) 50 MCG/ACT nasal spray  Has the patient contacted their pharmacy? Yes (Agent: If no, request that the patient contact the pharmacy for the refill. If patient does not wish to contact the pharmacy document the reason why and proceed with request.) (Agent: If yes, when and what did the pharmacy advise?)  This is the patient's preferred pharmacy:  [facility] [place], [place] - [address] [place] [address] Phone: [phone] Fax: [phone]  Is this the correct pharmacy for this prescription? Yes If no, delete pharmacy and type the correct one.   Has the prescription been filled recently? No.  Is the patient out of the medication? Yes  Has the patient been seen for an appointment in the last year OR does the patient have an upcoming appointment? Yes  Can we respond through MyChart? Yes  Agent: Please be advised that Rx refills may take up to 3 business days. We ask that you follow-up with your pharmacy.

## 2023-07-17 DIAGNOSIS — M542 Cervicalgia: Secondary | ICD-10-CM | POA: Diagnosis not present

## 2023-07-17 DIAGNOSIS — Z79899 Other long term (current) drug therapy: Secondary | ICD-10-CM | POA: Diagnosis not present

## 2023-07-17 DIAGNOSIS — M4727 Other spondylosis with radiculopathy, lumbosacral region: Secondary | ICD-10-CM | POA: Diagnosis not present

## 2023-07-17 DIAGNOSIS — M62838 Other muscle spasm: Secondary | ICD-10-CM | POA: Diagnosis not present

## 2023-07-17 DIAGNOSIS — F17210 Nicotine dependence, cigarettes, uncomplicated: Secondary | ICD-10-CM | POA: Diagnosis not present

## 2023-07-17 DIAGNOSIS — Z6826 Body mass index (BMI) 26.0-26.9, adult: Secondary | ICD-10-CM | POA: Diagnosis not present

## 2023-07-23 DIAGNOSIS — Z79899 Other long term (current) drug therapy: Secondary | ICD-10-CM | POA: Diagnosis not present

## 2023-08-03 ENCOUNTER — Other Ambulatory Visit: Payer: Self-pay | Admitting: Nurse Practitioner

## 2023-08-12 ENCOUNTER — Telehealth: Payer: Self-pay | Admitting: Acute Care

## 2023-08-12 DIAGNOSIS — Z122 Encounter for screening for malignant neoplasm of respiratory organs: Secondary | ICD-10-CM

## 2023-08-12 DIAGNOSIS — F17210 Nicotine dependence, cigarettes, uncomplicated: Secondary | ICD-10-CM

## 2023-08-12 DIAGNOSIS — Z87891 Personal history of nicotine dependence: Secondary | ICD-10-CM

## 2023-08-12 NOTE — Telephone Encounter (Signed)
 Lung Cancer Screening Narrative/Criteria Questionnaire (Cigarette Smokers Only- No Cigars/Pipes/vapes)   SHENEA GIACOBBE   SDMV:08/16/2023 11:45a [place]      06/24/1965   LDCT: 08/31/2023 1:30p AP    58 y.o.   Phone: [phone]  Lung Screening Narrative (confirm age 49-77 yrs Medicare / 50-80 yrs Private pay insurance)   Insurance information:mcd   Referring Provider:Dr. Zollie   This screening involves an initial phone call with a team member from our program. It is called a shared decision making visit. The initial meeting is required by  insurance and Medicare to make sure you understand the program. This appointment takes about 15-20 minutes to complete. You will complete the screening scan at your scheduled date/time.  This scan takes about 5-10 minutes to complete. You can eat and drink normally before and after the scan.  Criteria questions for Lung Cancer Screening:   Are you a current or former smoker? Current Age began smoking: 58yo   If you are a former smoker, what year did you quit smoking? N/A(within 15 yrs)   To calculate your smoking history, I need an accurate estimate of how many packs of cigarettes you smoked per day and for how many years. (Not just the number of PPD you are now smoking)   Years smoking 41 x Packs per day 1 = Pack years 41   (at least 20 pack yrs)   (Make sure they understand that we need to know how much they have smoked in the past, not just the number of PPD they are smoking now)  Do you have a personal history of cancer?  No    Do you have a family history of cancer? Yes  (cancer type and and relative) Mother - cervical, PGM - colon, PGF - throat  Are you coughing up blood?  No  Have you had unexplained weight loss of 15 lbs or more in the last 6 months? No  It looks like you meet all criteria.  When would be a good time for us  to schedule you for this screening?   Additional information: N/A

## 2023-08-15 ENCOUNTER — Other Ambulatory Visit: Payer: Self-pay | Admitting: Medical Genetics

## 2023-08-16 ENCOUNTER — Ambulatory Visit: Admitting: Adult Health

## 2023-08-16 ENCOUNTER — Encounter: Payer: Self-pay | Admitting: Adult Health

## 2023-08-16 DIAGNOSIS — F17210 Nicotine dependence, cigarettes, uncomplicated: Secondary | ICD-10-CM | POA: Diagnosis not present

## 2023-08-16 NOTE — Patient Instructions (Signed)

## 2023-08-16 NOTE — Progress Notes (Signed)
  Virtual Visit via Telephone Note  I connected with Delon JAYSON Angle , 08/16/23 11:51 AM by a telemedicine application and verified that I am speaking with the correct person using two identifiers.  Location: Patient: home Provider: home   I discussed the limitations of evaluation and management by telemedicine and the availability of in person appointments. The patient expressed understanding and agreed to proceed.   Shared Decision Making Visit Lung Cancer Screening Program [phone])   Eligibility: 58 y.o. Pack Years Smoking History Calculation = 41 pack years  (# packs/per year x # years smoked) Recent History of coughing up blood  no Unexplained weight loss? no ( >Than 15 pounds within the last 6 months ) Prior History Lung / other cancer no (Diagnosis within the last 5 years already requiring surveillance chest CT Scans). Smoking Status Current Smoker   Visit Components: Discussion included one or more decision making aids. YES Discussion included risk/benefits of screening. YES Discussion included potential follow up diagnostic testing for abnormal scans. YES Discussion included meaning and risk of over diagnosis. YES Discussion included meaning and risk of False Positives. YES Discussion included meaning of total radiation exposure. YES  Counseling Included: Importance of adherence to annual lung cancer LDCT screening. YES Impact of comorbidities on ability to participate in the program. YES Ability and willingness to under diagnostic treatment. YES  Smoking Cessation Counseling: Current Smokers:  Discussed importance of smoking cessation. yes Information about tobacco cessation classes and interventions provided to patient. yes Patient provided with ticket for LDCT Scan. yes Symptomatic Patient. NO Diagnosis Code: Tobacco Use Z72.0 Asymptomatic Patient yes  Counseling - 4 minutes of smoking cessation counseling (CT Chest Lung Cancer Screening Low Dose W/O  CM) [id number]  Z12.2-Screening of respiratory organs Z87.891-Personal history of nicotine  dependence   Lamarr Myers 08/16/23

## 2023-08-24 ENCOUNTER — Other Ambulatory Visit: Payer: Self-pay | Admitting: Family Medicine

## 2023-08-31 ENCOUNTER — Ambulatory Visit (HOSPITAL_COMMUNITY)
Admission: RE | Admit: 2023-08-31 | Discharge: 2023-08-31 | Disposition: A | Discharge status: Home / Self Care | Source: Ambulatory Visit | Attending: Acute Care | Admitting: Acute Care

## 2023-08-31 ENCOUNTER — Other Ambulatory Visit (HOSPITAL_COMMUNITY)
Admission: RE | Admit: 2023-08-31 | Discharge: 2023-08-31 | Disposition: A | Discharge status: Home / Self Care | Source: Ambulatory Visit | Attending: Oncology | Admitting: Oncology

## 2023-08-31 DIAGNOSIS — Z122 Encounter for screening for malignant neoplasm of respiratory organs: Secondary | ICD-10-CM | POA: Insufficient documentation

## 2023-08-31 DIAGNOSIS — Z87891 Personal history of nicotine dependence: Secondary | ICD-10-CM | POA: Insufficient documentation

## 2023-08-31 DIAGNOSIS — F17210 Nicotine dependence, cigarettes, uncomplicated: Secondary | ICD-10-CM | POA: Insufficient documentation

## 2023-09-07 ENCOUNTER — Other Ambulatory Visit: Payer: Self-pay

## 2023-09-07 DIAGNOSIS — Z87891 Personal history of nicotine dependence: Secondary | ICD-10-CM

## 2023-09-07 DIAGNOSIS — F17210 Nicotine dependence, cigarettes, uncomplicated: Secondary | ICD-10-CM

## 2023-09-07 DIAGNOSIS — Z122 Encounter for screening for malignant neoplasm of respiratory organs: Secondary | ICD-10-CM

## 2023-09-11 DIAGNOSIS — Z79899 Other long term (current) drug therapy: Secondary | ICD-10-CM | POA: Diagnosis not present

## 2023-09-11 DIAGNOSIS — F17210 Nicotine dependence, cigarettes, uncomplicated: Secondary | ICD-10-CM | POA: Diagnosis not present

## 2023-09-11 DIAGNOSIS — M62838 Other muscle spasm: Secondary | ICD-10-CM | POA: Diagnosis not present

## 2023-09-11 DIAGNOSIS — M542 Cervicalgia: Secondary | ICD-10-CM | POA: Diagnosis not present

## 2023-09-11 DIAGNOSIS — M129 Arthropathy, unspecified: Secondary | ICD-10-CM | POA: Diagnosis not present

## 2023-09-11 DIAGNOSIS — M4727 Other spondylosis with radiculopathy, lumbosacral region: Secondary | ICD-10-CM | POA: Diagnosis not present

## 2023-09-11 DIAGNOSIS — Z6826 Body mass index (BMI) 26.0-26.9, adult: Secondary | ICD-10-CM | POA: Diagnosis not present

## 2023-09-11 LAB — GENECONNECT MOLECULAR SCREEN: Genetic Analysis Overall Interpretation: NEGATIVE

## 2023-09-16 DIAGNOSIS — Z79899 Other long term (current) drug therapy: Secondary | ICD-10-CM | POA: Diagnosis not present

## 2023-09-20 ENCOUNTER — Other Ambulatory Visit: Payer: Self-pay | Admitting: Nurse Practitioner

## 2023-09-20 DIAGNOSIS — E1151 Type 2 diabetes mellitus with diabetic peripheral angiopathy without gangrene: Secondary | ICD-10-CM

## 2023-09-27 ENCOUNTER — Other Ambulatory Visit: Payer: Self-pay | Admitting: Nurse Practitioner

## 2023-09-27 ENCOUNTER — Other Ambulatory Visit: Payer: Self-pay | Admitting: Family Medicine

## 2023-09-27 DIAGNOSIS — K219 Gastro-esophageal reflux disease without esophagitis: Secondary | ICD-10-CM

## 2023-09-27 DIAGNOSIS — E782 Mixed hyperlipidemia: Secondary | ICD-10-CM

## 2023-09-27 DIAGNOSIS — I272 Pulmonary hypertension, unspecified: Secondary | ICD-10-CM

## 2023-09-27 DIAGNOSIS — Z1231 Encounter for screening mammogram for malignant neoplasm of breast: Secondary | ICD-10-CM

## 2023-09-27 DIAGNOSIS — Z0000 Encounter for general adult medical examination without abnormal findings: Secondary | ICD-10-CM

## 2023-09-27 DIAGNOSIS — Z7985 Long-term (current) use of injectable non-insulin antidiabetic drugs: Secondary | ICD-10-CM

## 2023-09-27 DIAGNOSIS — E1151 Type 2 diabetes mellitus with diabetic peripheral angiopathy without gangrene: Secondary | ICD-10-CM

## 2023-09-28 ENCOUNTER — Ambulatory Visit: Admitting: Dermatology

## 2023-09-28 ENCOUNTER — Encounter: Payer: Self-pay | Admitting: Dermatology

## 2023-09-28 VITALS — BP 131/83 | HR 93

## 2023-09-28 DIAGNOSIS — D485 Neoplasm of uncertain behavior of skin: Secondary | ICD-10-CM

## 2023-09-28 NOTE — Patient Instructions (Signed)

## 2023-09-28 NOTE — Progress Notes (Signed)
   New Patient Visit   Subjective  Penny Moreno is a 58 y.o. female who presents for the following: growth  Pt has a growth on her left knee she'd like evaluated. Present for years, growing, previously treated with antibiotics. Tender when pressure is applied.   The following portions of the chart were reviewed this encounter and updated as appropriate: medications, allergies, medical history  Review of Systems:  No other skin or systemic complaints except as noted in HPI or Assessment and Plan.  Objective  Well appearing patient in no apparent distress; mood and affect are within normal limits.  A focused examination was performed of the following areas: Left knee  Relevant exam findings are noted in the Assessment and Plan.  Left Knee - Anterior 2.5 x 2.3 cm pink mobile nodule   Assessment & Plan    LIKELY EPIDERMAL INCLUSION CYST VS LIPOMA Exam: Subcutaneous nodule at left knee  Benign-appearing. Discussed that a cyst is a benign growth that can grow over time and sometimes get irritated or inflamed. Recommend observation if it is not bothersome. Discussed option of surgical excision to remove it if it is growing, symptomatic, or other changes noted. Patient would like to get scheduled for WLE.   NEOPLASM OF UNCERTAIN BEHAVIOR OF SKIN Left Knee - Anterior  Return for SURGERY.  I, Darice Smock, CMA, am acting as scribe for RUFUS CHRISTELLA HOLY, MD.   Documentation: I have reviewed the above documentation for accuracy and completeness, and I agree with the above.  RUFUS CHRISTELLA HOLY, MD

## 2023-09-29 ENCOUNTER — Ambulatory Visit: Admitting: Family Medicine

## 2023-09-29 ENCOUNTER — Encounter: Payer: Self-pay | Admitting: Family Medicine

## 2023-09-29 ENCOUNTER — Ambulatory Visit: Payer: Self-pay | Admitting: Family Medicine

## 2023-09-29 VITALS — BP 103/68 | HR 100 | Temp 97.8°F | Ht 62.0 in | Wt 142.4 lb

## 2023-09-29 DIAGNOSIS — E1151 Type 2 diabetes mellitus with diabetic peripheral angiopathy without gangrene: Secondary | ICD-10-CM

## 2023-09-29 DIAGNOSIS — E1140 Type 2 diabetes mellitus with diabetic neuropathy, unspecified: Secondary | ICD-10-CM

## 2023-09-29 DIAGNOSIS — Z7985 Long-term (current) use of injectable non-insulin antidiabetic drugs: Secondary | ICD-10-CM | POA: Diagnosis not present

## 2023-09-29 DIAGNOSIS — F411 Generalized anxiety disorder: Secondary | ICD-10-CM

## 2023-09-29 DIAGNOSIS — E782 Mixed hyperlipidemia: Secondary | ICD-10-CM | POA: Diagnosis not present

## 2023-09-29 DIAGNOSIS — Z23 Encounter for immunization: Secondary | ICD-10-CM | POA: Diagnosis not present

## 2023-09-29 DIAGNOSIS — E1159 Type 2 diabetes mellitus with other circulatory complications: Secondary | ICD-10-CM

## 2023-09-29 DIAGNOSIS — I152 Hypertension secondary to endocrine disorders: Secondary | ICD-10-CM | POA: Diagnosis not present

## 2023-09-29 DIAGNOSIS — F331 Major depressive disorder, recurrent, moderate: Secondary | ICD-10-CM | POA: Diagnosis not present

## 2023-09-29 LAB — BAYER DCA HB A1C WAIVED: HB A1C (BAYER DCA - WAIVED): 7.5 % — ABNORMAL HIGH (ref 4.8–5.6)

## 2023-09-29 LAB — LIPID PANEL

## 2023-09-29 MED ORDER — ALPRAZOLAM 0.5 MG PO TABS
0.5000 mg | ORAL_TABLET | Freq: Two times a day (BID) | ORAL | Status: AC
Start: 2023-09-29 — End: ?

## 2023-09-29 MED ORDER — OMEGA-3-ACID ETHYL ESTERS 1 G PO CAPS: 1.0000 | ORAL_CAPSULE | Freq: Every day | ORAL | 3 refills | Status: AC

## 2023-09-29 MED ORDER — GLIPIZIDE ER 5 MG PO TB24: 5.0000 mg | ORAL_TABLET | Freq: Every day | ORAL | 3 refills | Status: AC

## 2023-09-29 NOTE — Addendum Note (Signed)
 Addended by: JODENE AMI NOVAK on: 09/29/2023 02:22 PM   Modules accepted: Orders

## 2023-09-29 NOTE — Progress Notes (Signed)
 Subjective:  Patient ID: Penny Moreno, female    DOB: 1965-03-25  Age: 58 y.o. MRN: [id number]  CC: 3 MONTH DIABETES FOLLOW UP   HPI  Discussed the use of AI scribe software for clinical note transcription with the patient, who gave verbal consent to proceed.  History of Present Illness [name] is a 58 year old female with diabetes who presents for a diabetes checkup.  She has been monitoring her blood sugar levels at home, with readings as high as 220 mg/dL, though often under 799 mg/dL. Her morning levels are frequently around 131 mg/dL. Her recent A1c is 7.5%. She mentions significantly reducing her food intake to manage her blood sugar levels. She is currently taking Trulicity  4.5 mg weekly and glipizide , which was recently refilled.  She is managing her cholesterol with atorvastatin , Zetia , gemfibrozil , and Lovaza . She expresses concern about the significant copay for her medications and the number of cholesterol medications she is taking. She has adjusted her diet, which she believes has helped lower her cholesterol.  She has a history of emphysema, confirmed by a CT scan, and experiences shortness of breath at night about once a week. She is working on reducing her smoking and uses albuterol  as needed for shortness of breath.  Her current medications include alprazolam  0.5 mg twice a day as needed, atenolol  25 mg daily, and tizanidine for muscle relaxation. She is also taking cetirizine  for allergies and finerenone  for kidney protection. She is lactose intolerant and prefers almond milk.  She reports feeling hot easily and experiences shortness of breath at night.          09/29/2023    1:17 PM 06/29/2023    1:04 PM 12/24/2022   12:06 PM  Depression screen PHQ 2/9  Decreased Interest 0 3 3  Down, Depressed, Hopeless 0 1 1  PHQ - 2 Score 0 4 4  Altered sleeping  2 2  Tired, decreased energy  2 2  Change in appetite  1 1  Feeling bad or failure about  yourself   0 0  Trouble concentrating  3 3  Moving slowly or fidgety/restless  0 2  Suicidal thoughts  1 1  PHQ-9 Score  13 15  Difficult doing work/chores  Somewhat difficult Not difficult at all    History [name] has a past medical history of Anxiety, Arthritis, Back pain, Bipolar disorder (HCC), Depression, Eating disorder, Frozen shoulder, GERD (gastroesophageal reflux disease), Leukocytosis (07/02/2015), Neuromuscular disorder (HCC), OCD (obsessive compulsive disorder), Panic disorder, Vaginal Pap smear, abnormal, and Vocal cord polyps.   She has a past surgical history that includes Diagnostic laparoscopy; Tonsillectomy; Laparoscopic tubal ligation (10/30/2011); Tubal ligation (10/04/2011); and lt shoulder surgery.   Her family history includes Alcohol abuse in her father; Bipolar disorder in her sister; Cancer in her maternal grandmother; Heart disease (age of onset: 23) in her maternal grandfather; Hyperlipidemia in her father and mother; Hypertension in her mother.She reports that she has been smoking cigarettes. She has a 15 pack-year smoking history. She has never used smokeless tobacco. She reports that she does not drink alcohol and does not use drugs.    ROS Review of Systems  Constitutional: Negative.   HENT: Negative.    Eyes:  Negative for visual disturbance.  Respiratory:  Negative for shortness of breath.   Cardiovascular:  Negative for chest pain.  Gastrointestinal:  Negative for abdominal pain.  Musculoskeletal:  Negative for arthralgias.    Objective:  BP 103/68  Pulse 100   Temp 97.8 F (36.6 C)   Ht 5' 2 (1.575 m)   Wt 142 lb 6.4 oz (64.6 kg)   LMP 09/03/2011   SpO2 93%   BMI 26.05 kg/m   BP Readings from Last 3 Encounters:  09/29/23 103/68  09/28/23 131/83  06/29/23 123/79    Wt Readings from Last 3 Encounters:  09/29/23 142 lb 6.4 oz (64.6 kg)  06/29/23 149 lb (67.6 kg)  05/11/23 150 lb (68 kg)     Physical Exam Constitutional:       General: She is not in acute distress.    Appearance: She is well-developed.  HENT:     Head: Normocephalic and atraumatic.  Eyes:     Conjunctiva/sclera: Conjunctivae normal.     Pupils: Pupils are equal, round, and reactive to light.  Neck:     Thyroid : No thyromegaly.  Cardiovascular:     Rate and Rhythm: Normal rate and regular rhythm.     Heart sounds: Normal heart sounds. No murmur heard. Pulmonary:     Effort: Pulmonary effort is normal. No respiratory distress.     Breath sounds: Normal breath sounds. No wheezing or rales.  Abdominal:     General: Bowel sounds are normal. There is no distension.     Palpations: Abdomen is soft.     Tenderness: There is no abdominal tenderness.  Musculoskeletal:        General: Normal range of motion.     Cervical back: Normal range of motion and neck supple.  Lymphadenopathy:     Cervical: No cervical adenopathy.  Skin:    General: Skin is warm and dry.  Neurological:     Mental Status: She is alert and oriented to person, place, and time.  Psychiatric:        Behavior: Behavior normal.        Thought Content: Thought content normal.        Judgment: Judgment normal.      Assessment & Plan:  Type 2 diabetes mellitus with diabetic neuropathy, without long-term current use of insulin (HCC) -     Bayer DCA Hb A1c Waived  Mixed hyperlipidemia -     CBC with Differential/Platelet -     CMP14+EGFR -     Lipid panel  Hypertension associated with diabetes (HCC) -     CBC with Differential/Platelet -     CMP14+EGFR  Moderate episode of recurrent major depressive disorder (HCC) -     ALPRAZolam ; Take 1 tablet (0.5 mg total) by mouth 2 (two) times daily. For anxiety  GAD (generalized anxiety disorder) -     ALPRAZolam ; Take 1 tablet (0.5 mg total) by mouth 2 (two) times daily. For anxiety  Type II diabetes mellitus with peripheral circulatory disorder (HCC) -     glipiZIDE  ER; Take 1 tablet (5 mg total) by mouth daily with  breakfast.  Dispense: 90 tablet; Refill: 3  Long-term current use of injectable noninsulin antidiabetic medication -     glipiZIDE  ER; Take 1 tablet (5 mg total) by mouth daily with breakfast.  Dispense: 90 tablet; Refill: 3  Other orders -     Omega-3-acid  Ethyl Esters; Take 1 capsule (1 g total) by mouth daily.  Dispense: 90 capsule; Refill: 3    Assessment and Plan Assessment & Plan Type 2 diabetes mellitus with diabetic neuropathy and circulatory complications   Blood sugar levels are generally under 200 mg/dL, occasionally reaching 220 mg/dL. A1c improved to 7.5%, with  a goal of 7.0% or less. No issues with Trulicity  and Glipizide . Continue Trulicity  4.5 mg/0.5 mL injection once a week and Glipizide  5 mg oral daily with breakfast. Encourage maintaining blood sugar levels under 125 mg/dL fasting and under 859 mg/dL postprandial. Review blood sugar logs at next visit.  Hypertension associated with diabetes   Currently managed with Atenolol  25 mg oral at bedtime. Continue Atenolol  25 mg oral at bedtime.  Mixed hyperlipidemia   Cholesterol levels decreased, but medication continuation is necessary as diet alone is insufficient. Continue Atorvastatin  80 mg oral daily, Ezetimibe  10 mg oral daily, Omega-3 acid ethyl esters (Lovaza ) 1 g oral daily, and Gemfibrozil  600 mg oral twice daily before meals. Review cholesterol levels when blood work results are available.  Emphysema   Diagnosed via CT scan. Reports shortness of breath once a week during sleep. Currently using Albuterol  inhaler as needed. Continue Albuterol  inhaler as needed for shortness of breath. Encourage smoking cessation to improve lung health.  Tobacco use (current, >20 pack years)   Currently reducing smoking and exploring herbal diffusers as a smoking cessation aid. Encourage complete smoking cessation and consider herbal diffusers as a smoking cessation aid.  Recording duration: 17 minutes       Follow-up: Return in  about 3 months (around 12/30/2023).  Butler Der, M.D.

## 2023-09-29 NOTE — Addendum Note (Signed)
 Addended by: JODENE AMI NOVAK on: 09/29/2023 03:17 PM   Modules accepted: Orders

## 2023-09-30 LAB — CBC WITH DIFFERENTIAL/PLATELET
Basophils Absolute: 0.1 x10E3/uL (ref 0.0–0.2)
Basos: 0 %
EOS (ABSOLUTE): 0.2 x10E3/uL (ref 0.0–0.4)
Eos: 2 %
Hematocrit: 47.8 % — ABNORMAL HIGH (ref 34.0–46.6)
Hemoglobin: 15.8 g/dL (ref 11.1–15.9)
Immature Grans (Abs): 0 x10E3/uL (ref 0.0–0.1)
Immature Granulocytes: 0 %
Lymphocytes Absolute: 3.9 x10E3/uL — ABNORMAL HIGH (ref 0.7–3.1)
Lymphs: 29 %
MCH: 31.7 pg (ref 26.6–33.0)
MCHC: 33.1 g/dL (ref 31.5–35.7)
MCV: 96 fL (ref 79–97)
Monocytes Absolute: 0.5 x10E3/uL (ref 0.1–0.9)
Monocytes: 4 %
Neutrophils Absolute: 8.9 x10E3/uL — ABNORMAL HIGH (ref 1.4–7.0)
Neutrophils: 65 %
Platelets: 205 x10E3/uL (ref 150–450)
RBC: 4.98 x10E6/uL (ref 3.77–5.28)
RDW: 12.2 % (ref 11.7–15.4)
WBC: 13.6 x10E3/uL — ABNORMAL HIGH (ref 3.4–10.8)

## 2023-09-30 LAB — CMP14+EGFR
ALT: 15 IU/L (ref 0–32)
AST: 12 IU/L (ref 0–40)
Albumin: 4.8 g/dL (ref 3.8–4.9)
Alkaline Phosphatase: 111 IU/L (ref 44–121)
BUN/Creatinine Ratio: 8 — AB (ref 9–23)
BUN: 6 mg/dL (ref 6–24)
Bilirubin Total: 0.3 mg/dL (ref 0.0–1.2)
CO2: 23 mmol/L (ref 20–29)
Calcium: 10.1 mg/dL (ref 8.7–10.2)
Chloride: 100 mmol/L (ref 96–106)
Creatinine, Ser: 0.72 mg/dL (ref 0.57–1.00)
Globulin, Total: 2.3 g/dL (ref 1.5–4.5)
Glucose: 168 mg/dL — AB (ref 70–99)
Potassium: 4.5 mmol/L (ref 3.5–5.2)
Sodium: 139 mmol/L (ref 134–144)
Total Protein: 7.1 g/dL (ref 6.0–8.5)
eGFR: 98 mL/min/1.73 (ref >59)

## 2023-09-30 LAB — LIPID PANEL
Cholesterol, Total: 134 mg/dL (ref 100–199)
HDL: 34 mg/dL — AB (ref >39)
LDL CALC COMMENT:: 3.9 ratio (ref 0.0–4.4)
LDL Chol Calc (NIH): 61 mg/dL (ref 0–99)
Triglycerides: 238 mg/dL — AB (ref 0–149)
VLDL Cholesterol Cal: 39 mg/dL (ref 5–40)

## 2023-10-26 DIAGNOSIS — F332 Major depressive disorder, recurrent severe without psychotic features: Secondary | ICD-10-CM | POA: Diagnosis not present

## 2023-11-02 ENCOUNTER — Telehealth: Payer: Self-pay | Admitting: Pharmacy Technician

## 2023-11-02 ENCOUNTER — Other Ambulatory Visit (HOSPITAL_COMMUNITY): Payer: Self-pay

## 2023-11-02 NOTE — Telephone Encounter (Signed)
 Pharmacy Patient Advocate Encounter   Received notification from Onbase that prior authorization for Trulicity  4.5MG /0.5ML auto-injectors is required/requested.   Insurance verification completed.   The patient is insured through [employer].   Per test claim: PA required; PA submitted to above mentioned insurance via Latent Key/confirmation #/[facility] Status is pending

## 2023-11-03 ENCOUNTER — Other Ambulatory Visit (HOSPITAL_COMMUNITY): Payer: Self-pay

## 2023-11-03 NOTE — Telephone Encounter (Signed)
 Pharmacy Patient Advocate Encounter  Received notification from [facility] MEDICAID that Prior Authorization for Trulicity  4.5MG /0.5ML auto-injectors has been APPROVED from 11/03/2023 to 11/02/2024. Unable to obtain price due to refill too soon rejection, last fill date 11/03/2023 next available fill date10/22/2025.   PA #/Case ID/Reference #: [id number]

## 2023-11-25 ENCOUNTER — Encounter: Payer: Self-pay | Admitting: Dermatology

## 2023-11-30 ENCOUNTER — Ambulatory Visit (INDEPENDENT_AMBULATORY_CARE_PROVIDER_SITE_OTHER): Admitting: Dermatology

## 2023-11-30 ENCOUNTER — Encounter: Payer: Self-pay | Admitting: Dermatology

## 2023-11-30 VITALS — BP 114/70 | HR 81 | Temp 98.2°F

## 2023-11-30 DIAGNOSIS — L720 Epidermal cyst: Secondary | ICD-10-CM

## 2023-11-30 DIAGNOSIS — D485 Neoplasm of uncertain behavior of skin: Secondary | ICD-10-CM | POA: Diagnosis not present

## 2023-11-30 NOTE — Progress Notes (Signed)
 Follow-Up Visit   Subjective  Penny Moreno is a 58 y.o. female who presents for the following: Excision of a Neoplasm of Skin on the left knee.   The following portions of the chart were reviewed this encounter and updated as appropriate: medications, allergies, medical history  Review of Systems:  No other skin or systemic complaints except as noted in HPI or Assessment and Plan.  Objective  Well appearing patient in no apparent distress; mood and affect are within normal limits.  A focused examination was performed of the following areas: Left knee Relevant physical exam findings are noted in the Assessment and Plan.   Left Knee 2.5 x 2.3 cm pink mobile nodule    Assessment & Plan   NEOPLASM OF UNCERTAIN BEHAVIOR OF SKIN Left Knee Skin excision  Excision method:  elliptical Lesion length (cm):  2.8 Lesion width (cm):  2.5 Margin per side (cm):  0.1 Total excision diameter (cm):  3 Informed consent: discussed and consent obtained   Timeout: patient name, date of birth, surgical site, and procedure verified   Procedure prep:  Patient was prepped and draped in usual sterile fashion Prep type:  Chlorhexidine Anesthesia: the lesion was anesthetized in a standard fashion   Anesthetic:  1% lidocaine  w/ epinephrine 1-100,000 buffered w/ 8.4% NaHCO3 Instrument used: #15 blade   Hemostasis achieved with: suture, pressure and electrodesiccation   Outcome: patient tolerated procedure well with no complications   Post-procedure details: sterile dressing applied and wound care instructions given   Dressing type: pressure dressing (Steri strips)    Skin repair Complexity:  Complex Final length (cm):  5 Informed consent: discussed and consent obtained   Timeout: patient name, date of birth, surgical site, and procedure verified   Procedure prep:  Patient was prepped and draped in usual sterile fashion Prep type:  Chlorhexidine Anesthesia: the lesion was anesthetized in a  standard fashion   Anesthetic:  1% lidocaine  w/ epinephrine 1-100,000 buffered w/ 8.4% NaHCO3 Reason for type of repair: reduce the risk of dehiscence, infection, and necrosis, preserve normal anatomy, preserve normal anatomical and functional relationships and avoid adjacent structures   Undermining: area extensively undermined   Subcutaneous layers (deep stitches):  Suture size:  3-0 Suture type: Vicryl Rapide (coated polyglactin 910)   Stitches:  Buried vertical mattress Fine/surface layer approximation (top stitches):  Suture type: cyanoacrylate tissue glue   Hemostasis achieved with: suture, pressure and electrodesiccation Outcome: patient tolerated procedure well with no complications   Post-procedure details: sterile dressing applied and wound care instructions given   Dressing type: pressure dressing and bandage (steri strips)    Specimen 1 - Surgical pathology Differential Diagnosis: R/O cyst vs lipoma vs other  Check Margins: No  The surgical wound was then cleaned, prepped, and re-anesthetized as above. Wound edges were undermined extensively along at least one entire edge and at a distance equal to or greater than the width of the defect (see wound defect size above) in order to achieve closure and decrease wound tension and anatomic distortion. Redundant tissue repair including standing cone removal was performed. Hemostasis was achieved with electrocautery. Subcutaneous and epidermal tissues were approximated with the above sutures. The surgical site was then lightly scrubbed with sterile, saline-soaked gauze. Steri-strips were applied, and the area was then bandaged using Vaseline ointment, non-adherent gauze, gauze pads, and tape to provide an adequate pressure dressing. The patient tolerated the procedure well, was given detailed written and verbal wound care instructions, and was discharged in  good condition.   The patient will follow-up: 4 weeks.  Return if symptoms worsen  or fail to improve.  I, Doyce Pan, CMA, am acting as scribe for RUFUS CHRISTELLA HOLY, MD.   Documentation: I have reviewed the above documentation for accuracy and completeness, and I agree with the above.  RUFUS CHRISTELLA HOLY, MD

## 2023-11-30 NOTE — Patient Instructions (Signed)

## 2023-12-01 ENCOUNTER — Ambulatory Visit: Payer: Self-pay | Admitting: Dermatology

## 2023-12-01 LAB — SURGICAL PATHOLOGY

## 2023-12-04 DIAGNOSIS — Z6826 Body mass index (BMI) 26.0-26.9, adult: Secondary | ICD-10-CM | POA: Diagnosis not present

## 2023-12-04 DIAGNOSIS — M542 Cervicalgia: Secondary | ICD-10-CM | POA: Diagnosis not present

## 2023-12-04 DIAGNOSIS — Z79899 Other long term (current) drug therapy: Secondary | ICD-10-CM | POA: Diagnosis not present

## 2023-12-04 DIAGNOSIS — M62838 Other muscle spasm: Secondary | ICD-10-CM | POA: Diagnosis not present

## 2023-12-04 DIAGNOSIS — M4727 Other spondylosis with radiculopathy, lumbosacral region: Secondary | ICD-10-CM | POA: Diagnosis not present

## 2023-12-09 DIAGNOSIS — Z79899 Other long term (current) drug therapy: Secondary | ICD-10-CM | POA: Diagnosis not present

## 2023-12-24 ENCOUNTER — Other Ambulatory Visit: Payer: Self-pay | Admitting: Family Medicine

## 2023-12-24 DIAGNOSIS — K219 Gastro-esophageal reflux disease without esophagitis: Secondary | ICD-10-CM

## 2023-12-24 DIAGNOSIS — Z7985 Long-term (current) use of injectable non-insulin antidiabetic drugs: Secondary | ICD-10-CM

## 2023-12-24 DIAGNOSIS — Z7984 Long term (current) use of oral hypoglycemic drugs: Secondary | ICD-10-CM

## 2023-12-24 DIAGNOSIS — E782 Mixed hyperlipidemia: Secondary | ICD-10-CM

## 2023-12-24 DIAGNOSIS — E1151 Type 2 diabetes mellitus with diabetic peripheral angiopathy without gangrene: Secondary | ICD-10-CM

## 2023-12-24 DIAGNOSIS — I272 Pulmonary hypertension, unspecified: Secondary | ICD-10-CM

## 2024-01-03 ENCOUNTER — Ambulatory Visit: Payer: Self-pay | Admitting: Family Medicine

## 2024-01-03 VITALS — BP 104/69 | HR 78 | Temp 98.0°F | Ht 62.0 in | Wt 142.0 lb

## 2024-01-03 DIAGNOSIS — E1151 Type 2 diabetes mellitus with diabetic peripheral angiopathy without gangrene: Secondary | ICD-10-CM | POA: Diagnosis not present

## 2024-01-03 DIAGNOSIS — Z23 Encounter for immunization: Secondary | ICD-10-CM

## 2024-01-03 DIAGNOSIS — K0889 Other specified disorders of teeth and supporting structures: Secondary | ICD-10-CM

## 2024-01-03 DIAGNOSIS — F17210 Nicotine dependence, cigarettes, uncomplicated: Secondary | ICD-10-CM

## 2024-01-03 DIAGNOSIS — I272 Pulmonary hypertension, unspecified: Secondary | ICD-10-CM

## 2024-01-03 DIAGNOSIS — E782 Mixed hyperlipidemia: Secondary | ICD-10-CM | POA: Diagnosis not present

## 2024-01-03 LAB — LIPID PANEL

## 2024-01-03 LAB — BAYER DCA HB A1C WAIVED: HB A1C (BAYER DCA - WAIVED): 6.2 % — ABNORMAL HIGH (ref 4.8–5.6)

## 2024-01-03 MED ORDER — AMOXICILLIN 875 MG PO TABS: 875.0000 mg | ORAL_TABLET | Freq: Two times a day (BID) | ORAL | 0 refills | Status: AC

## 2024-01-03 MED ORDER — GEMFIBROZIL 600 MG PO TABS: 600.0000 mg | ORAL_TABLET | Freq: Two times a day (BID) | ORAL | 0 refills | Status: AC

## 2024-01-03 MED ORDER — DICLOFENAC SODIUM 75 MG PO TBEC: 75.0000 mg | DELAYED_RELEASE_TABLET | Freq: Two times a day (BID) | ORAL | 2 refills | Status: AC

## 2024-01-03 NOTE — Progress Notes (Signed)
 Subjective:  Patient ID: Penny Moreno, female    DOB: 05-14-1965  Age: 58 y.o. MRN: [id number]  CC: Diabetes and Dental Pain (Right bottom side tooth ache. Would like abx called in.)   HPI  Discussed the use of AI scribe software for clinical note transcription with the patient, who gave verbal consent to proceed.  History of Present Illness Penny Moreno is a 58 year old female with diabetes who presents for a follow-up on her blood sugar management and medication review.  She is experiencing challenges in managing her blood sugar levels, with morning glucose readings sometimes higher than expected despite fasting overnight. She checks her blood sugar in the morning before eating and has noticed some high readings.  Her dietary habits include consuming smoothies made with banana, blueberry or pineapple, oatmeal, and almond milk. She acknowledges that her diet may be low in protein and is considering adding protein powder to her smoothies. She eats meat in small portions, preferring fish and sirloin, and avoids chicken.  She is currently taking Trulicity  injections weekly and Glucotrol  (glipizide ) daily for her diabetes management. No hypoglycemic episodes are reported, but she experiences occasional dizziness in the evenings, which she attributes to her medications.  She is experiencing a toothache and has an appointment for extraction scheduled for December 23rd. She has been using oil of oregano for pain relief and reports that the pain was previously extreme. She is not allergic to penicillin and has used amoxicillin  in the past for dental issues.  Her current medication regimen includes atorvastatin  and ezetimibe  for cholesterol management, and she takes gemfibrozil  once daily instead of the prescribed twice daily. She also takes Latuda , gabapentin , and uses pain patches. She has hydrocodone  available but has not opened her last prescription.          09/29/2023    1:17 PM  06/29/2023    1:04 PM 12/24/2022   12:06 PM  Depression screen PHQ 2/9  Decreased Interest 0 3 3  Down, Depressed, Hopeless 0 1 1  PHQ - 2 Score 0 4 4  Altered sleeping  2 2  Tired, decreased energy  2 2  Change in appetite  1 1  Feeling bad or failure about yourself   0 0  Trouble concentrating  3 3  Moving slowly or fidgety/restless  0 2  Suicidal thoughts  1 1  PHQ-9 Score  13  15   Difficult doing work/chores  Somewhat difficult Not difficult at all     Data saved with a previous flowsheet row definition    History Sharise has a past medical history of Anxiety, Arthritis, Back pain, Bipolar disorder (HCC), Depression, Eating disorder, Frozen shoulder, GERD (gastroesophageal reflux disease), Leukocytosis (07/02/2015), Neuromuscular disorder (HCC), OCD (obsessive compulsive disorder), Panic disorder, Vaginal Pap smear, abnormal, and Vocal cord polyps.   She has a past surgical history that includes Diagnostic laparoscopy; Tonsillectomy; Laparoscopic tubal ligation (10/30/2011); Tubal ligation (10/04/2011); and lt shoulder surgery.   Her family history includes Alcohol abuse in her father; Bipolar disorder in her sister; Cancer in her maternal grandmother; Heart disease (age of onset: 74) in her maternal grandfather; Hyperlipidemia in her father and mother; Hypertension in her mother.She reports that she has been smoking cigarettes. She has a 15 pack-year smoking history. She has never used smokeless tobacco. She reports that she does not drink alcohol and does not use drugs.    ROS Review of Systems  Constitutional: Negative.   HENT:  Positive for  mouth sores. Negative for congestion.   Eyes:  Negative for visual disturbance.  Respiratory:  Negative for shortness of breath.   Cardiovascular:  Negative for chest pain.  Gastrointestinal:  Negative for abdominal pain, constipation, diarrhea, nausea and vomiting.  Genitourinary:  Negative for difficulty urinating.  Musculoskeletal:   Negative for arthralgias and myalgias.  Neurological:  Negative for headaches.  Psychiatric/Behavioral:  Negative for sleep disturbance.     Objective:  BP 104/69   Pulse 78   Temp 98 F (36.7 C)   Ht 5' 2 (1.575 m)   Wt 142 lb (64.4 kg)   LMP 09/03/2011   SpO2 92%   BMI 25.97 kg/m   BP Readings from Last 3 Encounters:  01/03/24 104/69  11/30/23 114/70  09/29/23 103/68    Wt Readings from Last 3 Encounters:  01/03/24 142 lb (64.4 kg)  09/29/23 142 lb 6.4 oz (64.6 kg)  06/29/23 149 lb (67.6 kg)     Physical Exam Physical Exam GENERAL: Alert, cooperative, well developed, no acute distress HEENT: Normocephalic, normal oropharynx, moist mucous membranes CHEST: Clear to auscultation bilaterally, No wheezes, rhonchi, or crackles CARDIOVASCULAR: Normal heart rate and rhythm, S1 and S2 normal without murmurs ABDOMEN: Soft, non-tender, non-distended, without organomegaly, Normal bowel sounds EXTREMITIES: No cyanosis or edema NEUROLOGICAL: Cranial nerves grossly intact, Moves all extremities without gross motor or sensory deficit   Assessment & Plan:  Type II diabetes mellitus with peripheral circulatory disorder (HCC) -     Bayer DCA Hb A1c Waived -     Microalbumin / creatinine urine ratio  Mixed hyperlipidemia -     Lipid panel  Mixed hypertriglyceridemia -     Lipid panel -     Gemfibrozil ; Take 1 tablet (600 mg total) by mouth 2 (two) times daily before a meal. TAKE ONE TABLET TWICE DAILY BEFORE MEALS  Dispense: 180 tablet; Refill: 0  Pulmonary HTN (HCC) -     CBC with Differential/Platelet -     Comprehensive metabolic panel with GFR  Smoking greater than 20 pack years -     CBC with Differential/Platelet -     Comprehensive metabolic panel with GFR  Encounter for immunization -     Flu vaccine trivalent PF, 6mos and older(Flulaval,Afluria,Fluarix,Fluzone)  Toothache  Other orders -     Amoxicillin ; Take 1 tablet (875 mg total) by mouth 2 (two) times  daily for 10 days.  Dispense: 20 tablet; Refill: 0 -     Diclofenac  Sodium; Take 1 tablet (75 mg total) by mouth 2 (two) times daily. For TMJ and arthritis pain  Dispense: 60 tablet; Refill: 2    Assessment and Plan Assessment & Plan Type 2 diabetes mellitus with diabetic neuropathy and peripheral angiopathy   Blood glucose levels are generally well-controlled, though some readings are higher than expected. A1c results are pending. No hypoglycemia episodes reported. Her diet includes low protein intake, which may affect glucose control and satiety. Continue Trulicity  4.5 mg injection weekly and Glipizide  5 mg oral daily. Encourage increased protein intake to aid in glucose control and satiety. Provided a handout on hypoglycemia symptoms and management. Await A1c results.  Mixed hyperlipidemia   Currently managed with atorvastatin , ezetimibe , gemfibrozil , and Lovaza . Atorvastatin  is well-tolerated despite her disliking it. Ezetimibe  is generally well-tolerated with occasional mild abdominal discomfort. Gemfibrozil  dosage reduced to once daily. Statins reduce the risk of heart attack and stroke by 50-67%. Continue atorvastatin  80 mg oral daily, ezetimibe  10 mg oral daily, gemfibrozil  600 mg oral  once daily, and Lovaza  1 g oral daily. Await cholesterol panel results.  Hypertension associated with diabetes   Blood pressure management is ongoing with atenolol . Occasional dizziness reported, possibly related to atenolol . Continue atenolol  25 mg oral daily.  Dental abscess (awaiting extraction)   Severe dental pain due to an abscess, with an extraction scheduled for December 23rd. No penicillin allergy reported. Oil of oregano used for pain management. Prescribed amoxicillin  for the dental abscess. Proceed with scheduled dental extraction on December 23rd.  Chronic pain syndrome   Managed with gabapentin , muscle relaxers, and hydrocodone . No longer attending a pain management specialist. Continue  gabapentin  as prescribed, muscle relaxers as needed, and hydrocodone  5 mg as prescribed.  Depression   Managed with Latuda , prescribed by a psychiatrist. Continue Latuda  as prescribed by the psychiatrist.       Follow-up: Return in about 3 months (around 04/02/2024).  Butler Der, M.D.

## 2024-01-03 NOTE — Patient Instructions (Signed)
 Hypoglycemia Hypoglycemia is when the amount of sugar, or glucose, in your blood is too low. Low blood sugar can happen if you have diabetes or if you don't have diabetes. It may be an emergency. What are the causes? Low blood sugar happens most often in people who have diabetes. It may be caused by: Diabetes medicine. Not eating enough, or not eating often enough. Being more active than normal. If you don't have diabetes, you may still get low blood sugar if: There's a tumor in your pancreas. A tumor is a growth of cells that isn't normal. You don't eat enough, or you fast. Fasting is when you don't eat for long periods at a time. You have a bad infection or illness. You have problems after weight loss surgery. You have kidney or liver problems. You take certain medicines. What increases the risk? You're more likely to have low blood sugar if: You have diabetes and take medicine for it. You drink a lot of alcohol . You get sick. What are the signs or symptoms? Mild Hunger or feeling like you may vomit. Sweating and feeling cold to the touch. Feeling dizzy or light-headed. Being sleepy or having trouble sleeping. A headache. Blurry vision. Mood changes. These include feeling worried, nervous, or easily annoyed. Moderate Feeling confused. Changes in the way you act. Weakness. An uneven heartbeat. Very bad Having very low blood sugar is an emergency. It can cause: Fainting. Seizures. A coma. Death. How is this diagnosed?  Low blood sugar can be found with a blood test. This test tells you how much sugar is in your blood. It's done while you're having symptoms. Your health care provider may also do an exam and look at your medical history. How is this treated? Treating low blood sugar If you have low blood sugar, eat or drink something with sugar in it right away. The food or drink should have 15 grams of a fast-acting carbohydrate (carb). Options include: 4 oz (120 mL) of  fruit juice. 4 oz (120 mL) of soda (not diet soda). A few pieces of hard candy. Check food labels to see how many pieces to eat. 1 Tbsp (15 mL) of sugar or honey. 4 glucose tablets. 1 tube of glucose gel. Treating low blood sugar if you have diabetes Talk with your provider about how much carb you should take. If you're alert and can swallow safely, you may follow the 15:15 rule: Take 15 grams of a fast-acting carb. Check your blood sugar 15 minutes after you take the carb. If your blood sugar is still at or below 70 mg/dL (3.9 mmol/L), take 15 grams of a carb again. If your blood sugar doesn't go above 70 mg/dL (3.9 mmol/L) after 3 tries, get help right away. After your blood sugar goes back to normal, eat a meal or a snack within 1 hour. Always keep 15 grams of a fast-acting carb with you. This could be: 4 glucose tablets. A few pieces of hard candy. 1 Tbsp (15 mL) of honey or sugar. 1 tube of glucose gel. Treating very low blood sugar If your blood sugar is less than 54 mg/dL (3 mmol/L), it's an emergency. Get help right away. If you can't eat or drink, you will need to be given glucagon. A family member or friend should learn how to check your blood sugar and give you glucagon. Ask your provider if you should keep a glucagon kit at home. You may also need to be treated in a hospital. Follow  these instructions at home: If you have diabetes: Always keep a fast-acting carb (15 grams) with you. Follow your diabetes care plan. Make sure you: Know the symptoms of low blood sugar. Check your blood sugar as often as told. Always check it before and after you exercise. Always check your blood sugar before you drive. Take your medicines as told. Eat on time. Do not skip meals. Share your diabetes care plan with: Your work or school. The people you live with. Wear an alert bracelet or carry a card that says you have diabetes. General instructions If you drink alcohol : Limit how much you  have to: 0-1 drink a day if you're female. 0-2 drinks a day if you're female. Know how much alcohol  is in your drink. In the [place] one drink is one 12 oz bottle of beer (355 mL), one 5 oz glass of wine (148 mL), or one 1 oz glass of hard liquor (44 mL). Be sure to eat food when you drink alcohol . Be sure to check your blood sugar after you drink. Alcohol  may lead to low blood sugar later. Where to find more information American Diabetes Association (ADA): diabetes.org Contact a health care provider if: You have low blood sugar often. You have diabetes and are having trouble keeping your blood sugar in the right range. Get help right away if: You can't get your blood sugar above 70 mg/dL (3.9 mmol/L) after 3 tries. Your blood sugar is below 54 mg/dL (3 mmol/L). You have a seizure. You faint. These symptoms may be an emergency. Call 911 right away. Do not wait to see if the symptoms will go away. Do not drive yourself to the hospital. This information is not intended to replace advice given to you by your health care provider. Make sure you discuss any questions you have with your health care provider. Document Revised: 10/22/2022 Document Reviewed: 04/08/2022 Elsevier Patient Education  2024 [employer].

## 2024-01-04 ENCOUNTER — Ambulatory Visit: Payer: Self-pay | Admitting: Family Medicine

## 2024-01-04 LAB — CBC WITH DIFFERENTIAL/PLATELET
Basophils Absolute: 0.1 x10E3/uL (ref 0.0–0.2)
Basos: 1 %
EOS (ABSOLUTE): 0.2 x10E3/uL (ref 0.0–0.4)
Eos: 2 %
Hematocrit: 47.5 % — ABNORMAL HIGH (ref 34.0–46.6)
Hemoglobin: 15.9 g/dL (ref 11.1–15.9)
Immature Grans (Abs): 0 x10E3/uL (ref 0.0–0.1)
Immature Granulocytes: 0 %
Lymphocytes Absolute: 3.5 x10E3/uL — ABNORMAL HIGH (ref 0.7–3.1)
Lymphs: 31 %
MCH: 32.4 pg (ref 26.6–33.0)
MCHC: 33.5 g/dL (ref 31.5–35.7)
MCV: 97 fL (ref 79–97)
Monocytes Absolute: 0.5 x10E3/uL (ref 0.1–0.9)
Monocytes: 4 %
Neutrophils Absolute: 7.2 x10E3/uL — ABNORMAL HIGH (ref 1.4–7.0)
Neutrophils: 62 %
Platelets: 179 x10E3/uL (ref 150–450)
RBC: 4.9 x10E6/uL (ref 3.77–5.28)
RDW: 12 % (ref 11.7–15.4)
WBC: 11.5 x10E3/uL — ABNORMAL HIGH (ref 3.4–10.8)

## 2024-01-04 LAB — MICROALBUMIN / CREATININE URINE RATIO
Creatinine, Urine: 13.8 mg/dL
Microalb/Creat Ratio: <22 mg/g{creat} (ref 0–29)
Microalbumin, Urine: <3 ug/mL

## 2024-01-04 LAB — LIPID PANEL
Cholesterol, Total: 126 mg/dL (ref 100–199)
HDL: 36 mg/dL — AB (ref >39)
LDL CALC COMMENT:: 3.5 ratio (ref 0.0–4.4)
LDL Chol Calc (NIH): 55 mg/dL (ref 0–99)
Triglycerides: 213 mg/dL — AB (ref 0–149)
VLDL Cholesterol Cal: 35 mg/dL (ref 5–40)

## 2024-01-04 LAB — COMPREHENSIVE METABOLIC PANEL WITH GFR
ALT: 15 IU/L (ref 0–32)
AST: 14 IU/L (ref 0–40)
Albumin: 4.5 g/dL (ref 3.8–4.9)
Alkaline Phosphatase: 100 IU/L (ref 49–135)
BUN/Creatinine Ratio: 5 — AB (ref 9–23)
BUN: 4 mg/dL — AB (ref 6–24)
Bilirubin Total: 0.3 mg/dL (ref 0.0–1.2)
CO2: 24 mmol/L (ref 20–29)
Calcium: 9.7 mg/dL (ref 8.7–10.2)
Chloride: 100 mmol/L (ref 96–106)
Creatinine, Ser: 0.74 mg/dL (ref 0.57–1.00)
Globulin, Total: 2.2 g/dL (ref 1.5–4.5)
Glucose: 107 mg/dL — AB (ref 70–99)
Potassium: 4.3 mmol/L (ref 3.5–5.2)
Sodium: 139 mmol/L (ref 134–144)
Total Protein: 6.7 g/dL (ref 6.0–8.5)
eGFR: 95 mL/min/1.73 (ref >59)

## 2024-01-04 NOTE — Progress Notes (Signed)
Hello Brittley,  Your lab result is normal and/or stable.Some minor variations that are not significant are commonly marked abnormal, but do not represent any medical problem for you.  Best regards, Darrin Koman, M.D.

## 2024-03-09 ENCOUNTER — Other Ambulatory Visit: Payer: Self-pay | Admitting: Family Medicine

## 2024-03-09 ENCOUNTER — Ambulatory Visit: Admitting: Family Medicine

## 2024-04-03 ENCOUNTER — Ambulatory Visit: Admitting: Family Medicine
